# Patient Record
Sex: Male | Born: 1955
Health system: Southern US, Community
[De-identification: ages and names within clinical notes are randomized; demographics above are authoritative.]

## PROBLEM LIST (undated history)

## (undated) DIAGNOSIS — I4819 Other persistent atrial fibrillation: Secondary | ICD-10-CM

## (undated) DIAGNOSIS — G43909 Migraine, unspecified, not intractable, without status migrainosus: Secondary | ICD-10-CM

## (undated) DIAGNOSIS — G4733 Obstructive sleep apnea (adult) (pediatric): Secondary | ICD-10-CM

## (undated) DIAGNOSIS — K219 Gastro-esophageal reflux disease without esophagitis: Secondary | ICD-10-CM

## (undated) DIAGNOSIS — G8929 Other chronic pain: Secondary | ICD-10-CM

## (undated) DIAGNOSIS — E785 Hyperlipidemia, unspecified: Secondary | ICD-10-CM

## (undated) DIAGNOSIS — R51 Headache: Secondary | ICD-10-CM

## (undated) DIAGNOSIS — G4700 Insomnia, unspecified: Secondary | ICD-10-CM

## (undated) DIAGNOSIS — M549 Dorsalgia, unspecified: Secondary | ICD-10-CM

## (undated) DIAGNOSIS — Z9989 Dependence on other enabling machines and devices: Secondary | ICD-10-CM

## (undated) DIAGNOSIS — M109 Gout, unspecified: Secondary | ICD-10-CM

## (undated) DIAGNOSIS — I10 Essential (primary) hypertension: Secondary | ICD-10-CM

## (undated) DIAGNOSIS — J329 Chronic sinusitis, unspecified: Secondary | ICD-10-CM

## (undated) DIAGNOSIS — F419 Anxiety disorder, unspecified: Secondary | ICD-10-CM

## (undated) DIAGNOSIS — M419 Scoliosis, unspecified: Secondary | ICD-10-CM

## (undated) DIAGNOSIS — R519 Headache, unspecified: Secondary | ICD-10-CM

## (undated) DIAGNOSIS — Z87442 Personal history of urinary calculi: Secondary | ICD-10-CM

## (undated) DIAGNOSIS — I878 Other specified disorders of veins: Secondary | ICD-10-CM

## (undated) HISTORY — DX: Insomnia, unspecified: G47.00

## (undated) HISTORY — DX: Migraine, unspecified, not intractable, without status migrainosus: G43.909

## (undated) HISTORY — DX: Other persistent atrial fibrillation: I48.19

## (undated) HISTORY — DX: Other specified disorders of veins: I87.8

## (undated) HISTORY — DX: Essential (primary) hypertension: I10

## (undated) HISTORY — DX: Hyperlipidemia, unspecified: E78.5

## (undated) HISTORY — PX: ANKLE SURGERY: SHX546

## (undated) HISTORY — DX: Chronic sinusitis, unspecified: J32.9

## (undated) HISTORY — DX: Anxiety disorder, unspecified: F41.9

---
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
## 2000-08-28 ENCOUNTER — Ambulatory Visit (HOSPITAL_COMMUNITY): Admission: RE | Admit: 2000-08-28 | Discharge: 2000-08-28 | Payer: Self-pay | Admitting: Unknown Physician Specialty

## 2000-08-28 ENCOUNTER — Encounter: Payer: Self-pay | Admitting: Unknown Physician Specialty

## 2000-10-13 ENCOUNTER — Emergency Department (HOSPITAL_COMMUNITY): Admission: EM | Admit: 2000-10-13 | Discharge: 2000-10-13 | Payer: Self-pay | Admitting: Emergency Medicine

## 2001-06-12 ENCOUNTER — Encounter (HOSPITAL_COMMUNITY): Admission: RE | Admit: 2001-06-12 | Discharge: 2001-07-12 | Payer: Self-pay | Admitting: Family Medicine

## 2001-07-13 ENCOUNTER — Ambulatory Visit (HOSPITAL_COMMUNITY): Admission: RE | Admit: 2001-07-13 | Discharge: 2001-07-13 | Payer: Self-pay | Admitting: Family Medicine

## 2006-01-15 ENCOUNTER — Emergency Department (HOSPITAL_COMMUNITY): Admission: EM | Admit: 2006-01-15 | Discharge: 2006-01-16 | Payer: Self-pay | Admitting: Emergency Medicine

## 2006-11-25 ENCOUNTER — Ambulatory Visit (HOSPITAL_COMMUNITY): Admission: RE | Admit: 2006-11-25 | Discharge: 2006-11-25 | Payer: Self-pay | Admitting: Internal Medicine

## 2006-11-25 ENCOUNTER — Encounter: Payer: Self-pay | Admitting: Internal Medicine

## 2006-11-25 ENCOUNTER — Ambulatory Visit: Payer: Self-pay | Admitting: Internal Medicine

## 2008-11-18 ENCOUNTER — Emergency Department (HOSPITAL_COMMUNITY): Admission: EM | Admit: 2008-11-18 | Discharge: 2008-11-19 | Payer: Self-pay | Admitting: Emergency Medicine

## 2010-04-22 LAB — BASIC METABOLIC PANEL
BUN: 9 mg/dL (ref 6–23)
CO2: 32 mEq/L (ref 19–32)
Calcium: 10.3 mg/dL (ref 8.4–10.5)
Chloride: 105 mEq/L (ref 96–112)
Creatinine, Ser: 0.97 mg/dL (ref 0.4–1.5)
GFR calc Af Amer: >60 mL/min (ref >60)
GFR calc non Af Amer: >60 mL/min (ref >60)
Glucose, Bld: 100 mg/dL — ABNORMAL HIGH (ref 70–99)
Potassium: 4.1 mEq/L (ref 3.5–5.1)
Sodium: 141 mEq/L (ref 135–145)

## 2010-04-22 LAB — DIFFERENTIAL
Basophils Absolute: 0 10*3/uL (ref 0.0–0.1)
Basophils Relative: 0 % (ref 0–1)
Eosinophils Absolute: 0.3 10*3/uL (ref 0.0–0.7)
Eosinophils Relative: 4 % (ref 0–5)
Lymphocytes Relative: 34 % (ref 12–46)
Lymphs Abs: 2 10*3/uL (ref 0.7–4.0)
Monocytes Absolute: 0.4 10*3/uL (ref 0.1–1.0)
Monocytes Relative: 7 % (ref 3–12)
Neutro Abs: 3.2 10*3/uL (ref 1.7–7.7)
Neutrophils Relative %: 55 % (ref 43–77)

## 2010-04-22 LAB — POCT CARDIAC MARKERS
CKMB, poc: <1 ng/mL — ABNORMAL LOW (ref 1.0–8.0)
Myoglobin, poc: 85.3 ng/mL (ref 12–200)
Troponin i, poc: <0.05 ng/mL (ref 0.00–0.09)

## 2010-04-22 LAB — CBC
HCT: 43.2 % (ref 39.0–52.0)
Hemoglobin: 15.1 g/dL (ref 13.0–17.0)
MCHC: 35 g/dL (ref 30.0–36.0)
MCV: 91.7 fL (ref 78.0–100.0)
Platelets: 177 10*3/uL (ref 150–400)
RBC: 4.71 MIL/uL (ref 4.22–5.81)
RDW: 12.2 % (ref 11.5–15.5)
WBC: 5.9 10*3/uL (ref 4.0–10.5)

## 2010-06-02 NOTE — Op Note (Signed)
Anthony [name], Anthony [name]               ACCOUNT NO.:  [id number]   MEDICAL RECORD NO.:  [id number]          PATIENT TYPE:  AMB   LOCATION:  DAY                           FACILITY:  [facility]   PHYSICIAN:  R. Roetta Sessions, M.D. DATE OF BIRTH:  Nov 23, 1955   DATE OF PROCEDURE:  11/25/2006  DATE OF DISCHARGE:                               OPERATIVE REPORT   COLONOSCOPY WITH BIOPSY:   INDICATIONS FOR PROCEDURE:  Mr. Couser is a very pleasant 55 year old  Caucasian male with no lower GI tract symptoms sent over through the  courtesy of Dr. Lubertha South for colorectal cancer screening.  He has  never his lower GI tract imaged.  There is no family history of colon  cancer.  Colonoscopy is now being done as a standard screening maneuver.  This approach has been discussed with the patient along with the  potential risks, benefits and alternatives.  Please see the  documentation in the medical record.   PROCEDURE NOTE:  O2 saturation, blood pressure, pulse and respirations  were monitored throughout the entire procedure.  Conscious sedation:  Versed 4 mg IV, Demerol 75 mg IV in divided doses.  Instrument:  Pentax  video chip system.   FINDINGS:  Digital rectal exam revealed no abnormalities.  Endoscopic  findings:  The prep was adequate.   Colon:  Colonic mucosa was surveyed from the rectosigmoid junction  through the left, transverse, right colon to area of the appendiceal  orifice, ileocecal valve and cecum.  These structures were well-seen and  photographed for the record.  From this level the scope was slowly,  cautiously withdrawn.  All previously-mentioned mucosal surfaces were  again seen.  The colonic mucosa appeared entirely normal.  The scope was  pulled down into the rectum, where a thorough examination of the rectal  mucosa including a retroflexed view of the anal verge demonstrated  numerous 2-3-mm small sunflower-appearing lesions with discrete areas  of pale mucosa surrounded by  erythematous borders.  These lesions had a  curious appearance and did not appear to represent neoplasm or  inflammatory process.  Biopsies were taken.  The remaining rectal mucosa  appeared normal.  The patient tolerated the procedure well and was  reacted in endoscopy.   IMPRESSION:  1. Numerous tiny 2-3 mm sunflower-appearing lesions in the rectum,      likely not clinically significant, biopsied.  Otherwise normal      rectum.  2. Normal colon.   RECOMMENDATIONS:  1. Follow up on pathology.  2. Further recommendations to follow.      Jonathon Bellows, M.D.  Electronically Signed     RMR/MEDQ  D:  11/25/2006  T:  11/26/2006  Job:  [id number]   cc:   Donna Bernard, M.D.  Fax: [phone]

## 2010-07-12 ENCOUNTER — Emergency Department (HOSPITAL_COMMUNITY)
Admission: EM | Admit: 2010-07-12 | Discharge: 2010-07-12 | Disposition: A | Discharge status: Home / Self Care | Payer: 59 | Attending: Emergency Medicine | Admitting: Emergency Medicine

## 2010-07-12 DIAGNOSIS — Z79899 Other long term (current) drug therapy: Secondary | ICD-10-CM | POA: Insufficient documentation

## 2010-07-12 DIAGNOSIS — R509 Fever, unspecified: Secondary | ICD-10-CM | POA: Insufficient documentation

## 2010-07-12 DIAGNOSIS — I10 Essential (primary) hypertension: Secondary | ICD-10-CM | POA: Insufficient documentation

## 2010-07-12 DIAGNOSIS — E785 Hyperlipidemia, unspecified: Secondary | ICD-10-CM | POA: Insufficient documentation

## 2010-07-12 LAB — URINALYSIS, ROUTINE W REFLEX MICROSCOPIC
Bilirubin Urine: NEGATIVE
Hgb urine dipstick: NEGATIVE
Ketones, ur: NEGATIVE mg/dL
Nitrite: NEGATIVE
Protein, ur: NEGATIVE mg/dL
Specific Gravity, Urine: 1.025 (ref 1.005–1.030)
Urobilinogen, UA: 0.2 mg/dL (ref 0.0–1.0)

## 2010-07-12 LAB — BASIC METABOLIC PANEL
BUN: 11 mg/dL (ref 6–23)
Calcium: 9.7 mg/dL (ref 8.4–10.5)
GFR calc Af Amer: >60 mL/min (ref >60)
GFR calc non Af Amer: >60 mL/min (ref >60)
Glucose, Bld: 110 mg/dL — ABNORMAL HIGH (ref 70–99)
Potassium: 3.6 mEq/L (ref 3.5–5.1)

## 2010-07-12 LAB — DIFFERENTIAL
Basophils Absolute: 0 10*3/uL (ref 0.0–0.1)
Basophils Relative: 1 % (ref 0–1)
Eosinophils Relative: 0 % (ref 0–5)
Monocytes Absolute: 0.3 10*3/uL (ref 0.1–1.0)
Monocytes Relative: 8 % (ref 3–12)

## 2010-07-12 LAB — CBC
HCT: 38.1 % — ABNORMAL LOW (ref 39.0–52.0)
Hemoglobin: 13.6 g/dL (ref 13.0–17.0)
MCH: 31.7 pg (ref 26.0–34.0)
MCHC: 35.7 g/dL (ref 30.0–36.0)
RDW: 12 % (ref 11.5–15.5)

## 2012-05-08 ENCOUNTER — Telehealth: Payer: Self-pay | Admitting: Family Medicine

## 2012-05-08 NOTE — Telephone Encounter (Signed)
Please see attached to chart refill for Sumatriptan to [facility]

## 2012-05-09 ENCOUNTER — Other Ambulatory Visit: Payer: Self-pay

## 2012-05-09 MED ORDER — SUMATRIPTAN SUCCINATE 50 MG PO TABS: ORAL_TABLET | ORAL | Status: DC

## 2012-05-09 NOTE — Telephone Encounter (Signed)
May refill times three 

## 2012-05-09 NOTE — Telephone Encounter (Signed)
RX sent in to pharmacy via E-prescribe

## 2012-05-16 ENCOUNTER — Encounter: Payer: Self-pay | Admitting: *Deleted

## 2012-05-19 ENCOUNTER — Other Ambulatory Visit: Payer: Self-pay | Admitting: Family Medicine

## 2012-05-19 NOTE — Telephone Encounter (Signed)
Had PE 02/21/12

## 2012-05-19 NOTE — Telephone Encounter (Signed)
Ambien RX faxed to [facility]

## 2012-05-20 ENCOUNTER — Other Ambulatory Visit: Payer: Self-pay | Admitting: Family Medicine

## 2012-05-22 NOTE — Telephone Encounter (Signed)
Ok plus 3 refills 

## 2012-06-02 ENCOUNTER — Ambulatory Visit (INDEPENDENT_AMBULATORY_CARE_PROVIDER_SITE_OTHER): Payer: 59 | Admitting: Family Medicine

## 2012-06-02 ENCOUNTER — Encounter: Payer: Self-pay | Admitting: Family Medicine

## 2012-06-02 VITALS — BP 166/104 | Temp 98.2°F | Wt 205.2 lb

## 2012-06-02 DIAGNOSIS — E785 Hyperlipidemia, unspecified: Secondary | ICD-10-CM | POA: Insufficient documentation

## 2012-06-02 DIAGNOSIS — J029 Acute pharyngitis, unspecified: Secondary | ICD-10-CM

## 2012-06-02 DIAGNOSIS — I10 Essential (primary) hypertension: Secondary | ICD-10-CM | POA: Insufficient documentation

## 2012-06-02 MED ORDER — DOXYCYCLINE HYCLATE 100 MG PO TABS: 100.0000 mg | ORAL_TABLET | Freq: Two times a day (BID) | ORAL | Status: DC

## 2012-06-02 NOTE — Progress Notes (Signed)
  Subjective:    Patient ID: Anthony [name], male    DOB: [birth year], 57 y.o.   MRN: [id number]  Sore Throat  This is a new problem. The current episode started yesterday. The problem has been gradually worsening. Neither side of throat is experiencing more pain than the other. The maximum temperature recorded prior to his arrival was 100 - 100.9 F. Associated symptoms include congestion and coughing. He has had no exposure to strep. He has tried nothing for the symptoms. The treatment provided mild relief.    Occasional cough diminished energy. States even his skin hurts  Review of Systems  HENT: Positive for congestion.   Respiratory: Positive for cough.    ROS otherwise negative    Objective:   Physical Exam  Alert no acute distress. Vitals stable. Lungs clear. Heart regular in rhythm. Pharynx very erythematous. Frontal tenderness.      Assessment & Plan:  Impression pharyngitis with lymphadenitis and systemic features. Plan antibiotics prescribed. Symptomatic care discussed.

## 2012-06-06 ENCOUNTER — Encounter: Payer: Self-pay | Admitting: Family Medicine

## 2012-06-06 ENCOUNTER — Telehealth: Payer: Self-pay | Admitting: Family Medicine

## 2012-06-06 ENCOUNTER — Other Ambulatory Visit: Payer: Self-pay | Admitting: *Deleted

## 2012-06-06 MED ORDER — BENZONATATE 100 MG PO CAPS: 100.0000 mg | ORAL_CAPSULE | Freq: Three times a day (TID) | ORAL | Status: DC | PRN

## 2012-06-06 MED ORDER — AZITHROMYCIN 1 G PO PACK: 1.0000 | PACK | Freq: Once | ORAL | Status: DC

## 2012-06-06 NOTE — Telephone Encounter (Signed)
Meds called in, pt aware

## 2012-06-06 NOTE — Telephone Encounter (Signed)
I would recommend switching from Doxy to a z-pack. As for cough med two things #1 - cough is typically for a reason and until the illness gets better than the cough will persist. #2- pt list allergic to codiene therefore use Tessalon 100mg  one tid prn cough , 20 pearles, 1 refill.   If symptoms persist or worsen follow up

## 2012-06-06 NOTE — Telephone Encounter (Signed)
Patient is not feeling any better and would like some cough syrup called in and a work excuse for today and tomorrow.

## 2012-06-06 NOTE — Telephone Encounter (Signed)
Patient seen on 06/02/12 and was diagnosed with pharyngitis with lymphadenitis and systemic features. Was prescribed doxycycline 100 mg BID x 10 days.

## 2012-06-09 DIAGNOSIS — Z0289 Encounter for other administrative examinations: Secondary | ICD-10-CM

## 2012-06-28 ENCOUNTER — Emergency Department (HOSPITAL_COMMUNITY)
Admission: EM | Admit: 2012-06-28 | Discharge: 2012-06-28 | Disposition: A | Discharge status: Home / Self Care | Payer: 59 | Attending: Emergency Medicine | Admitting: Emergency Medicine

## 2012-06-28 ENCOUNTER — Encounter (HOSPITAL_COMMUNITY): Payer: Self-pay | Admitting: Emergency Medicine

## 2012-06-28 DIAGNOSIS — R112 Nausea with vomiting, unspecified: Secondary | ICD-10-CM | POA: Insufficient documentation

## 2012-06-28 DIAGNOSIS — G4700 Insomnia, unspecified: Secondary | ICD-10-CM | POA: Insufficient documentation

## 2012-06-28 DIAGNOSIS — I10 Essential (primary) hypertension: Secondary | ICD-10-CM | POA: Insufficient documentation

## 2012-06-28 DIAGNOSIS — Z79899 Other long term (current) drug therapy: Secondary | ICD-10-CM | POA: Insufficient documentation

## 2012-06-28 DIAGNOSIS — R509 Fever, unspecified: Secondary | ICD-10-CM | POA: Insufficient documentation

## 2012-06-28 DIAGNOSIS — R52 Pain, unspecified: Secondary | ICD-10-CM | POA: Insufficient documentation

## 2012-06-28 DIAGNOSIS — R197 Diarrhea, unspecified: Secondary | ICD-10-CM | POA: Insufficient documentation

## 2012-06-28 DIAGNOSIS — R000 Tachycardia, unspecified: Secondary | ICD-10-CM | POA: Insufficient documentation

## 2012-06-28 DIAGNOSIS — E785 Hyperlipidemia, unspecified: Secondary | ICD-10-CM | POA: Insufficient documentation

## 2012-06-28 DIAGNOSIS — R0602 Shortness of breath: Secondary | ICD-10-CM | POA: Insufficient documentation

## 2012-06-28 DIAGNOSIS — Z8709 Personal history of other diseases of the respiratory system: Secondary | ICD-10-CM | POA: Insufficient documentation

## 2012-06-28 DIAGNOSIS — R109 Unspecified abdominal pain: Secondary | ICD-10-CM | POA: Insufficient documentation

## 2012-06-28 DIAGNOSIS — M549 Dorsalgia, unspecified: Secondary | ICD-10-CM | POA: Insufficient documentation

## 2012-06-28 DIAGNOSIS — G43909 Migraine, unspecified, not intractable, without status migrainosus: Secondary | ICD-10-CM | POA: Insufficient documentation

## 2012-06-28 DIAGNOSIS — Z8679 Personal history of other diseases of the circulatory system: Secondary | ICD-10-CM | POA: Insufficient documentation

## 2012-06-28 DIAGNOSIS — M79609 Pain in unspecified limb: Secondary | ICD-10-CM | POA: Insufficient documentation

## 2012-06-28 LAB — POCT I-STAT, CHEM 8
BUN: 12 mg/dL (ref 6–23)
Calcium, Ion: 1.11 mmol/L — ABNORMAL LOW (ref 1.12–1.23)
Chloride: 105 mEq/L (ref 96–112)
Creatinine, Ser: 1.1 mg/dL (ref 0.50–1.35)
Glucose, Bld: 99 mg/dL (ref 70–99)
HCT: 44 % (ref 39.0–52.0)
Hemoglobin: 15 g/dL (ref 13.0–17.0)
Potassium: 3.8 mEq/L (ref 3.5–5.1)
Sodium: 142 mEq/L (ref 135–145)
TCO2: 26 mmol/L (ref 0–100)

## 2012-06-28 MED ORDER — ONDANSETRON HCL 4 MG PO TABS: 4.0000 mg | ORAL_TABLET | Freq: Four times a day (QID) | ORAL | Status: DC

## 2012-06-28 MED ORDER — LOPERAMIDE HCL 2 MG PO CAPS
4.0000 mg | ORAL_CAPSULE | Freq: Once | ORAL | Status: AC
  Administered 2012-06-28: 4 mg via ORAL
  Filled 2012-06-28 (×2): qty 2

## 2012-06-28 MED ORDER — KETOROLAC TROMETHAMINE 30 MG/ML IJ SOLN
15.0000 mg | Freq: Once | INTRAMUSCULAR | Status: AC
  Administered 2012-06-28: 15 mg via INTRAVENOUS
  Filled 2012-06-28 (×2): qty 1

## 2012-06-28 MED ORDER — MORPHINE SULFATE 4 MG/ML IJ SOLN
4.0000 mg | Freq: Once | INTRAMUSCULAR | Status: AC
  Administered 2012-06-28: 4 mg via INTRAVENOUS
  Filled 2012-06-28 (×2): qty 1

## 2012-06-28 MED ORDER — SODIUM CHLORIDE 0.9 % IV BOLUS (SEPSIS)
1000.0000 mL | Freq: Once | INTRAVENOUS | Status: AC
  Administered 2012-06-28: 1000 mL via INTRAVENOUS

## 2012-06-28 MED ORDER — ONDANSETRON HCL 4 MG/2ML IJ SOLN
4.0000 mg | Freq: Once | INTRAMUSCULAR | Status: AC
  Administered 2012-06-28: 4 mg via INTRAVENOUS
  Filled 2012-06-28 (×2): qty 2

## 2012-06-28 NOTE — ED Provider Notes (Signed)
History  This chart was scribed for Raeford Razor, MD by Ardelia Mems, ED Scribe. This patient was seen in room [facility] and the patient's care was started at 9:05 PM.   CSN: [id number]  Arrival date & time 06/28/12  7829     Chief Complaint  Patient presents with  . Generalized Body Aches  . Nausea  . Emesis  . Diarrhea    The history is provided by the patient. No language interpreter was used.    HPI Comments: Anthony [name] is a 57 y.o. male who presents to the Emergency Department complaining of generalized body aches with associated nausea, vomiting, diarrhea, fever, chills and mild SOB onset yesterday afternoon. Pt states that he has had multiple episodes of emesis without blood. Triage Temp is 100.65F. Pt states that he is having abdominal pain, back pain, bilateral leg pain and arm pain. Pt states that he was with his sick step-daughter yesterday who had similar symptoms and is now better. Pt has tried Advil with some relief. Pt states that he took a Z-pack 4 weeks ago for a sinus infection. Pt denies recent travel. Pt denies urinary symptoms, cough, rash, hematochezia, or any other symptoms.     Past Medical History  Diagnosis Date  . Hypertension   . Hyperlipidemia   . Migraine headache   . Back pain   . Insomnia   . Reflux   . Anxiety   . Venous stasis   . Sinusitis     Past Surgical History  Procedure Laterality Date  . Ankle surgery    . Abdominal surgery      No family history on file.  History  Substance Use Topics  . Smoking status: Never Smoker   . Smokeless tobacco: Not on file  . Alcohol Use: No      Review of Systems  Constitutional: Positive for fever and chills.  Respiratory: Positive for shortness of breath. Negative for cough.   Gastrointestinal: Positive for nausea, vomiting, abdominal pain and diarrhea. Negative for blood in stool.  Genitourinary: Negative for dysuria.  Musculoskeletal: Positive for back pain.       Bilateral  leg and arm pain.  Skin: Negative for rash.   A complete 10 system review of systems was obtained and all systems are negative except as noted in the HPI and PMH.   Allergies  Asa; Codeine; Lipitor; Nsaids; and Zocor  Home Medications   Current Outpatient Rx  Name  Route  Sig  Dispense  Refill  . ALPRAZolam (XANAX) 0.5 MG tablet      take 1 tablet if needed for anxiety   24 tablet   3   . Ascorbic Acid (VITAMIN C) 1000 MG tablet   Oral   Take 1,000 mg by mouth daily.         . benzonatate (TESSALON PERLES) 100 MG capsule   Oral   Take 1 capsule (100 mg total) by mouth 3 (three) times daily as needed for cough.   20 capsule   1   . diclofenac (VOLTAREN) 75 MG EC tablet   Oral   Take 75 mg by mouth 2 (two) times daily.         Marland Kitchen doxycycline (VIBRA-TABS) 100 MG tablet   Oral   Take 1 tablet (100 mg total) by mouth 2 (two) times daily.   20 tablet   0   . ezetimibe (ZETIA) 10 MG tablet   Oral   Take 10 mg by mouth daily.         Marland Kitchen  lisinopril (PRINIVIL,ZESTRIL) 20 MG tablet   Oral   Take 20 mg by mouth. Take one BID         . loratadine (CLARITIN) 10 MG tablet   Oral   Take 10 mg by mouth daily.         . Multiple Vitamin (MULTIVITAMIN) tablet   Oral   Take 1 tablet by mouth daily.         . SUMAtriptan (IMITREX) 50 MG tablet      Take 1 tablet at first sign of headache, may take 2 tablets 2 hours later if needed   20 tablet   3   . verapamil (CALAN-SR) 240 MG CR tablet   Oral   Take 240 mg by mouth at bedtime.         . vitamin E 400 UNIT capsule   Oral   Take 400 Units by mouth daily.         Marland Kitchen zolpidem (AMBIEN) 10 MG tablet   Oral   Take 10 mg by mouth at bedtime as needed for sleep.           Triage Vitals: BP 160/105  Pulse 113  Temp(Src) 100.2 F (37.9 C) (Oral)  Resp 20  Ht 6' (1.829 m)  Wt 200 lb (90.719 kg)  BMI 27.12 kg/m2  SpO2 98%  Physical Exam  Nursing note and vitals reviewed. Constitutional: He is  oriented to person, place, and time. He appears well-developed and well-nourished.  HENT:  Head: Normocephalic and atraumatic.  Eyes: EOM are normal.  Neck: Normal range of motion.  Cardiovascular: Normal rate, regular rhythm, normal heart sounds and intact distal pulses.   tachycardic.  Pulmonary/Chest: Effort normal and breath sounds normal. No respiratory distress.  Abdominal: Soft. He exhibits no distension. There is no tenderness.  Genitourinary: Rectum normal.  Musculoskeletal: Normal range of motion.  Neurological: He is alert and oriented to person, place, and time.  Skin: Skin is warm and dry.  Psychiatric: He has a normal mood and affect. Judgment normal.    ED Course  Procedures (including critical care time)  DIAGNOSTIC STUDIES: Oxygen Saturation is 98% on RA, normal by my interpretation.    COORDINATION OF CARE: 9:23 PM- Pt advised of plan for treatment and pt agrees.  10:30 PM- Pt recheck and pt is feeling better.   Medications  ondansetron (ZOFRAN) injection 4 mg (4 mg Intravenous Given 06/28/12 2219)  ketorolac (TORADOL) 30 MG/ML injection 15 mg (15 mg Intravenous Given 06/28/12 2223)  loperamide (IMODIUM) capsule 4 mg (4 mg Oral Given 06/28/12 2225)  morphine 4 MG/ML injection 4 mg (4 mg Intravenous Given 06/28/12 2221)  sodium chloride 0.9 % bolus 1,000 mL (0 mLs Intravenous Stopped 06/28/12 2316)      Labs Reviewed  POCT I-STAT, CHEM 8 - Abnormal; Notable for the following:    Calcium, Ion 1.11 (*)    All other components within normal limits   No results found.   1. Nausea vomiting and diarrhea       MDM  Suspect viral illness. benign abdominal exam. HD stable. Tachycardia improved with IVF/nsaids. Tolerating po prior to DC. Return precautions discussed.         I personally preformed the services scribed in my presence. The recorded information has been reviewed is accurate. Raeford Razor, MD.    Raeford Razor, MD 07/04/12 [phone]

## 2012-06-28 NOTE — ED Notes (Signed)
Feels better except for generalized headache

## 2012-06-28 NOTE — ED Notes (Signed)
Pt c/o generalized body aches with n/v/d for about 12hours.

## 2012-06-29 MED ORDER — ONDANSETRON HCL 4 MG/2ML IJ SOLN
INTRAMUSCULAR | Status: AC
  Filled 2012-06-29: qty 2

## 2012-07-06 ENCOUNTER — Ambulatory Visit (INDEPENDENT_AMBULATORY_CARE_PROVIDER_SITE_OTHER): Payer: 59 | Admitting: Family Medicine

## 2012-07-06 ENCOUNTER — Encounter: Payer: Self-pay | Admitting: Family Medicine

## 2012-07-06 VITALS — BP 126/97 | Temp 98.7°F | Wt 205.4 lb

## 2012-07-06 DIAGNOSIS — K5289 Other specified noninfective gastroenteritis and colitis: Secondary | ICD-10-CM

## 2012-07-06 DIAGNOSIS — K529 Noninfective gastroenteritis and colitis, unspecified: Secondary | ICD-10-CM

## 2012-07-06 NOTE — Progress Notes (Signed)
  Subjective:    Patient ID: Anthony Hail., male    DOB: 04-Aug-1955, 57 y.o.   MRN: [id number]  HPI Nausea felt better. Vomited times fifteen before then. Diarrhea too. Missed work for three days. Had to go to the emergency room.  Still experiencing spells of nausea intermittently. Loose stools overall have improved. Intermittent abdominal pain. Crampy in nature.  . Others had similar spells. Still quite fatigued. Review of Systems    review systems otherwise negative. Objective:   Physical Exam  Alert good hydration. Lungs clear. Heart regular rate and rhythm. HEENT normal. Abdomen soft good bowel sounds no discrete tenderness.      Assessment & Plan:  Impression resulting gastroenteritis with severe symptomatology. Slowly improving. Plan diet discussed. Gradual increase activity. Zofran when necessary for nausea. WSL

## 2012-08-22 ENCOUNTER — Telehealth: Payer: Self-pay | Admitting: Family Medicine

## 2012-08-22 MED ORDER — AMOXICILLIN 500 MG PO TABS: 500.0000 mg | ORAL_TABLET | Freq: Three times a day (TID) | ORAL | Status: DC

## 2012-08-22 NOTE — Telephone Encounter (Signed)
Rx sent electronically to [facility].  Patient notified. 

## 2012-08-22 NOTE — Telephone Encounter (Signed)
Review sx, if sore throat/pain then amoxil 500 one tid 10 days OV if worse

## 2012-08-22 NOTE — Telephone Encounter (Signed)
Patient stated that his grandson and son Gordy Clement and Theodoro Parma) have been up here in the last few days and diagnosed with strep. He said that Dr Lorin Picket told Bentleys grandfather that anyone who had been exposed to [facility] and [facility] and is having symptoms will probably need an antibiotic and he would gladly call something in for them. He would like something called in to [facility] due to him having symptoms.

## 2012-08-26 ENCOUNTER — Other Ambulatory Visit: Payer: Self-pay | Admitting: Family Medicine

## 2012-09-16 ENCOUNTER — Other Ambulatory Visit: Payer: Self-pay | Admitting: Family Medicine

## 2012-10-19 ENCOUNTER — Ambulatory Visit (INDEPENDENT_AMBULATORY_CARE_PROVIDER_SITE_OTHER): Payer: 59 | Admitting: Family Medicine

## 2012-10-19 ENCOUNTER — Encounter: Payer: Self-pay | Admitting: Family Medicine

## 2012-10-19 VITALS — BP 128/84 | Temp 98.5°F | Ht 72.0 in | Wt 201.0 lb

## 2012-10-19 DIAGNOSIS — A088 Other specified intestinal infections: Secondary | ICD-10-CM

## 2012-10-19 DIAGNOSIS — A084 Viral intestinal infection, unspecified: Secondary | ICD-10-CM

## 2012-10-19 NOTE — Progress Notes (Signed)
  Subjective:    Patient ID: Anthony Hail., male    DOB: [birth year], 57 y.o.   MRN: [id number]  Emesis  This is a new problem. The current episode started in the past 7 days. Associated symptoms include abdominal pain, chills, diarrhea and myalgias. He has tried acetaminophen for the symptoms.    Feels really bad, von times three, otc pepto bis . tyl also  Diminished energy. Took old phenergan. Still sig indigestion.  Trying to keep up on fluids, gatorade etc.  Review of Systems  Constitutional: Positive for chills.  Gastrointestinal: Positive for vomiting, abdominal pain and diarrhea.  Musculoskeletal: Positive for myalgias.       Objective:   Physical Exam  Alert mild malaise. Hydration good. Neck supple HEENT normal. Lungs clear. Heart regular in rhythm abdomen hyperactive bowel sounds diffuse mild tenderness.      Assessment & Plan:  Impression viral syndrome with significant gastrointestinal symptoms discussed plan Zofran when necessary. Lomotil when necessary. Diet X. exercise discussed warning signs discussed. WSL

## 2012-10-26 DIAGNOSIS — Z029 Encounter for administrative examinations, unspecified: Secondary | ICD-10-CM

## 2012-11-04 ENCOUNTER — Other Ambulatory Visit: Payer: Self-pay | Admitting: Family Medicine

## 2012-11-13 ENCOUNTER — Other Ambulatory Visit: Payer: Self-pay | Admitting: Family Medicine

## 2012-11-13 NOTE — Telephone Encounter (Signed)
Ok five ref 

## 2012-12-18 ENCOUNTER — Other Ambulatory Visit: Payer: Self-pay | Admitting: Family Medicine

## 2012-12-19 NOTE — Telephone Encounter (Signed)
Steve's patient not mine

## 2012-12-19 NOTE — Telephone Encounter (Signed)
Ok plus 5 ref 

## 2012-12-21 ENCOUNTER — Other Ambulatory Visit: Payer: Self-pay | Admitting: *Deleted

## 2012-12-21 MED ORDER — ALPRAZOLAM 0.5 MG PO TABS: 0.5000 mg | ORAL_TABLET | Freq: Every day | ORAL | Status: DC | PRN

## 2013-02-21 ENCOUNTER — Other Ambulatory Visit: Payer: Self-pay | Admitting: Family Medicine

## 2013-03-08 ENCOUNTER — Encounter: Payer: Self-pay | Admitting: Nurse Practitioner

## 2013-03-08 ENCOUNTER — Ambulatory Visit (INDEPENDENT_AMBULATORY_CARE_PROVIDER_SITE_OTHER): Payer: 59 | Admitting: Nurse Practitioner

## 2013-03-08 VITALS — BP 128/82 | Temp 98.4°F | Ht 72.0 in | Wt 210.6 lb

## 2013-03-08 DIAGNOSIS — J069 Acute upper respiratory infection, unspecified: Secondary | ICD-10-CM

## 2013-03-08 MED ORDER — HYDROCODONE-HOMATROPINE 5-1.5 MG/5ML PO SYRP: 5.0000 mL | ORAL_SOLUTION | ORAL | Status: DC | PRN

## 2013-03-08 MED ORDER — AMOXICILLIN-POT CLAVULANATE 875-125 MG PO TABS: 1.0000 | ORAL_TABLET | Freq: Two times a day (BID) | ORAL | Status: DC

## 2013-03-08 NOTE — Patient Instructions (Signed)
nasacort AQ as directed Biotin

## 2013-03-10 ENCOUNTER — Other Ambulatory Visit: Payer: Self-pay | Admitting: Family Medicine

## 2013-03-11 ENCOUNTER — Encounter: Payer: Self-pay | Admitting: Nurse Practitioner

## 2013-03-11 NOTE — Progress Notes (Signed)
Subjective:  Presents for c/o sore throat and cough that began 2 days ago. Facial pressure. Chills. Possible fever. Ear pressure and pain. Cough worse at night. Producing yellow mucus.   Objective:   BP 128/82  Temp(Src) 98.4 F (36.9 C) (Oral)  Ht 6' (1.829 m)  Wt 210 lb 9.6 oz (95.528 kg)  BMI 28.56 kg/m2 NAD. Alert, oriented. TMs clear effusion. Pharynx mild erythema. Neck supple with mild anterior adenopathy. Lungs clear. Heart RRR.  Assessment: Acute upper respiratory infections of unspecified site  Plan:  Meds ordered this encounter  Medications  . amoxicillin-clavulanate (AUGMENTIN) 875-125 MG per tablet    Sig: Take 1 tablet by mouth 2 (two) times daily.    Dispense:  20 tablet    Refill:  0    Order Specific Question:  Supervising Provider    Answer:  Mikey Kirschner [[id number]]  . HYDROcodone-homatropine (HYCODAN) 5-1.5 MG/5ML syrup    Sig: Take 5 mLs by mouth every 4 (four) hours as needed.    Dispense:  120 mL    Refill:  0    Order Specific Question:  Supervising Provider    Answer:  Mikey Kirschner [[id number]]  reviewed warning signs and symptomatic care. Call back if worsens or persists.

## 2013-03-12 ENCOUNTER — Encounter: Payer: Self-pay | Admitting: Family Medicine

## 2013-03-13 ENCOUNTER — Encounter: Payer: Self-pay | Admitting: Family Medicine

## 2013-03-13 ENCOUNTER — Telehealth: Payer: Self-pay | Admitting: Family Medicine

## 2013-03-13 MED ORDER — LEVOFLOXACIN 500 MG PO TABS: 500.0000 mg | ORAL_TABLET | Freq: Every day | ORAL | Status: DC

## 2013-03-13 NOTE — Telephone Encounter (Signed)
levaquin 500 qd ten d 

## 2013-03-13 NOTE — Telephone Encounter (Signed)
Please give work excuse today and tomorrow

## 2013-03-13 NOTE — Telephone Encounter (Signed)
Saw Anthony [name] on 02/19 diagnosed with URI. Prescribed augmentin and hycodan

## 2013-03-13 NOTE — Telephone Encounter (Signed)
Med sent to pharm. Pt notified. On voicemail.

## 2013-03-13 NOTE — Telephone Encounter (Signed)
Up front for patient to pick up

## 2013-03-13 NOTE — Telephone Encounter (Signed)
Patient is still having nasal drainage, sore throat, no appetite. Would like something else called in. [facility]  Patient would also like a work excuse for today and tomorrow.

## 2013-03-22 DIAGNOSIS — Z0289 Encounter for other administrative examinations: Secondary | ICD-10-CM

## 2013-05-11 ENCOUNTER — Other Ambulatory Visit: Payer: Self-pay | Admitting: Family Medicine

## 2013-05-11 NOTE — Telephone Encounter (Signed)
Ok plus five mo worth

## 2013-06-05 ENCOUNTER — Ambulatory Visit (INDEPENDENT_AMBULATORY_CARE_PROVIDER_SITE_OTHER): Payer: 59 | Admitting: Family Medicine

## 2013-06-05 ENCOUNTER — Encounter: Payer: Self-pay | Admitting: Family Medicine

## 2013-06-05 VITALS — BP 154/96 | Temp 97.7°F | Ht 72.0 in | Wt 207.0 lb

## 2013-06-05 DIAGNOSIS — G4733 Obstructive sleep apnea (adult) (pediatric): Secondary | ICD-10-CM

## 2013-06-05 DIAGNOSIS — I10 Essential (primary) hypertension: Secondary | ICD-10-CM

## 2013-06-05 DIAGNOSIS — K219 Gastro-esophageal reflux disease without esophagitis: Secondary | ICD-10-CM

## 2013-06-05 DIAGNOSIS — G4730 Sleep apnea, unspecified: Secondary | ICD-10-CM

## 2013-06-05 DIAGNOSIS — Z79899 Other long term (current) drug therapy: Secondary | ICD-10-CM

## 2013-06-05 DIAGNOSIS — Z125 Encounter for screening for malignant neoplasm of prostate: Secondary | ICD-10-CM

## 2013-06-05 MED ORDER — PANTOPRAZOLE SODIUM 40 MG PO TBEC: 40.0000 mg | DELAYED_RELEASE_TABLET | Freq: Every day | ORAL | Status: DC

## 2013-06-05 NOTE — Progress Notes (Signed)
   Subjective:    Patient ID: Anthony [name]., male    DOB: 1955/12/10, 58 y.o.   MRN: [id number]  HPI Patient is here today b/c he said he choked this morning at work on a biscuit. He said this has never happened before.   He said nothing was lodged in his throat, he just couldn't breathe, so he spit it out. It was his first bite. He gets this biscuit from the same place often, so it wasn't anything new.   Pt panicked, could not catch a breath, and had a HA afterwards.   Pt says he feels OK now.  Eating a biscuit, all of a sudden could not breathe  Started getting tunnel vision, barely could breathe  Finally got better  Water Ok since then  [employer] and cheese  Takes reflux ot cmeds ora reflux meds otc. The over-the-counter medications are not working at this time.  Daytime energy not the best, oft4en doesn't feel good  freq trouble breathing at night. Tremendous amount of snoring. Definitely getting worse. Frequently stops breathing through the night. This is worrisome to the patient's wife. Never had this spell before    Review of Systems No chest pain no headache no back pain positive daytime fatigue no change in bowel habits some chronic epigastric discomfort. ROS otherwise negative    Objective:   Physical Exam Alert no acute distress. H&T normal neck supple. Pharynx normal lungs clear. Heart regular in rhythm. Mild epigastric tenderness.       Assessment & Plan:  Impression 3 distinct diagnoses #1 worsening sleep apnea time to press on with sleep study discussed at length. #2 reflux with gastritis and responsive to over-the-counter agents. #3 choking spell. Proper chewing of food discussed. Heimlich maneuver discussed with family. Plan followup in one month. Appropriate blood work. Sleep study. Protonic stable he. WSL

## 2013-06-07 LAB — HEPATIC FUNCTION PANEL
ALT: 26 U/L (ref 0–53)
AST: 24 U/L (ref 0–37)
Albumin: 4.1 g/dL (ref 3.5–5.2)
Alkaline Phosphatase: 54 U/L (ref 39–117)
BILIRUBIN DIRECT: 0.1 mg/dL (ref 0.0–0.3)
Indirect Bilirubin: 0.6 mg/dL (ref 0.2–1.2)
Total Bilirubin: 0.7 mg/dL (ref 0.2–1.2)
Total Protein: 6.5 g/dL (ref 6.0–8.3)

## 2013-06-07 LAB — BASIC METABOLIC PANEL
BUN: 12 mg/dL (ref 6–23)
CO2: 29 mEq/L (ref 19–32)
Calcium: 9 mg/dL (ref 8.4–10.5)
Chloride: 107 mEq/L (ref 96–112)
Creat: 1.13 mg/dL (ref 0.50–1.35)
Glucose, Bld: 87 mg/dL (ref 70–99)
POTASSIUM: 4 meq/L (ref 3.5–5.3)
Sodium: 142 mEq/L (ref 135–145)

## 2013-06-07 LAB — LIPID PANEL
CHOL/HDL RATIO: 6.3 ratio
CHOLESTEROL: 209 mg/dL — AB (ref 0–200)
HDL: 33 mg/dL — AB (ref >39)
LDL Cholesterol: 153 mg/dL — ABNORMAL HIGH (ref 0–99)
Triglycerides: 115 mg/dL (ref <150)
VLDL: 23 mg/dL (ref 0–40)

## 2013-06-08 LAB — PSA: PSA: 0.71 ng/mL (ref <=4.00)

## 2013-06-15 ENCOUNTER — Encounter: Payer: Self-pay | Admitting: Family Medicine

## 2013-06-15 ENCOUNTER — Ambulatory Visit (INDEPENDENT_AMBULATORY_CARE_PROVIDER_SITE_OTHER): Payer: 59 | Admitting: Family Medicine

## 2013-06-15 VITALS — BP 132/90 | Temp 98.1°F | Ht 72.0 in | Wt 206.5 lb

## 2013-06-15 DIAGNOSIS — J329 Chronic sinusitis, unspecified: Secondary | ICD-10-CM

## 2013-06-15 MED ORDER — AMOXICILLIN-POT CLAVULANATE 875-125 MG PO TABS
1.0000 | ORAL_TABLET | Freq: Two times a day (BID) | ORAL | Status: AC
Start: 2013-06-15 — End: 2013-06-29

## 2013-06-15 MED ORDER — FLUTICASONE PROPIONATE 50 MCG/ACT NA SUSP: NASAL | Status: DC

## 2013-06-15 NOTE — Progress Notes (Signed)
   Subjective:    Patient ID: Anthony [name]., male    DOB: 07-23-1955, 58 y.o.   MRN: [id number]  Sinusitis This is a new problem. The current episode started yesterday. The problem is unchanged. There has been no fever. His pain is at a severity of 10/10. The pain is moderate. Associated symptoms include ear pain, headaches, sinus pressure and a sore throat. (Spot on throat) Past treatments include acetaminophen. The treatment provided no relief.   Patient states that he has discomfort in his fingers also. Fingers bother at times, tight, trigger finger right hand, right handed  Allergies have been acting up,  Normally on claritin, not helping as much as hoped    Review of Systems  HENT: Positive for ear pain, sinus pressure and sore throat.   Neurological: Positive for headaches.   Vomiting no diarrhea    Objective:   Physical Exam  Alert vital signs stable frontal maxillary tenderness pharynx erythema neck supple. Lungs clear. Heart regular in rhythm.     Assessment & Plan:  Impression acute rhinosinusitis plan antibiotics prescribed. Symptomatic care discussed. WSL

## 2013-07-02 ENCOUNTER — Encounter: Payer: Self-pay | Admitting: Family Medicine

## 2013-07-02 ENCOUNTER — Ambulatory Visit (INDEPENDENT_AMBULATORY_CARE_PROVIDER_SITE_OTHER): Payer: 59 | Admitting: Family Medicine

## 2013-07-02 VITALS — BP 156/90 | Ht 72.0 in | Wt 209.0 lb

## 2013-07-02 DIAGNOSIS — G4733 Obstructive sleep apnea (adult) (pediatric): Secondary | ICD-10-CM

## 2013-07-02 DIAGNOSIS — F329 Major depressive disorder, single episode, unspecified: Secondary | ICD-10-CM

## 2013-07-02 DIAGNOSIS — I10 Essential (primary) hypertension: Secondary | ICD-10-CM

## 2013-07-02 DIAGNOSIS — K219 Gastro-esophageal reflux disease without esophagitis: Secondary | ICD-10-CM

## 2013-07-02 DIAGNOSIS — F3289 Other specified depressive episodes: Secondary | ICD-10-CM

## 2013-07-02 DIAGNOSIS — F32A Depression, unspecified: Secondary | ICD-10-CM | POA: Insufficient documentation

## 2013-07-02 DIAGNOSIS — E785 Hyperlipidemia, unspecified: Secondary | ICD-10-CM

## 2013-07-02 MED ORDER — BUPROPION HCL ER (SR) 150 MG PO TB12: ORAL_TABLET | ORAL | Status: DC

## 2013-07-02 NOTE — Progress Notes (Signed)
   Subjective:    Patient ID: Anthony Congo., male    DOB: 01-Dec-1955, 58 y.o.   MRN: [id number]  HPI Patient is here today for 1 month follow up.  He said he is being very careful when chewing now. No more choking episodes. Patient has had no further choking spells.  Pt still c/o cough and scratchy throat. Though improved.  Pt has not heard anything about the sleep study yet, so he has not gotten it done. Still having significant daytime drowsiness. Still having major snoring along with continuous of breath stopping per spells.  Compliant with blood pressure medicine. No obvious side effects. Watching salt intake. Walking some.  Diminished mood. Feeling down at times. Very irritable. Progressive over the past year. No suicidal thoughts. Would like to try medicine. [phone]   Review of Systems No chest pain no back pain no abdominal pain no change in bowel habits no blood in stool ROS 1    Objective:   Physical Exam Alert no acute distress. HEENT normal. Neck supple. Lungs clear. Heart regular in rhythm. Abdomen benign. Blood pressure good on repeat.       Assessment & Plan:  Impression 1 hypertension good control discussed #2 status post choking spell none further discuss. #3 reflux improved. #4 depression with element of anxiety discussed. Plan exercise encourage. Maintain same medications. Recheck in 3 months. Diet discussed. Initiate Wellbutrin as directed. WSL

## 2013-07-04 ENCOUNTER — Telehealth: Payer: Self-pay | Admitting: Family Medicine

## 2013-07-04 NOTE — Telephone Encounter (Signed)
Rx prior auth APPROVED for pt's BUPROPION SR, expires 07/04/2013 through [facility], faxed approval to [facility]

## 2013-07-06 ENCOUNTER — Telehealth: Payer: Self-pay | Admitting: Family Medicine

## 2013-07-06 MED ORDER — LEVOFLOXACIN 500 MG PO TABS: 500.0000 mg | ORAL_TABLET | Freq: Every day | ORAL | Status: DC

## 2013-07-06 NOTE — Telephone Encounter (Signed)
Was prescribed Augmentin 875 mg BID x 10 days  

## 2013-07-06 NOTE — Telephone Encounter (Signed)
Lev 500 qd for ten d 

## 2013-07-06 NOTE — Telephone Encounter (Signed)
Med sent to pharm. Pt notified.  

## 2013-07-06 NOTE — Telephone Encounter (Signed)
Patient was seen on 06/15/13 for rhinosinusitis and is still having a sore throat. He wants to know if we can call him in a different antibiotic.   [facility]

## 2013-07-08 ENCOUNTER — Other Ambulatory Visit: Payer: Self-pay | Admitting: Family Medicine

## 2013-07-09 NOTE — Telephone Encounter (Signed)
Last seen 07/02/13

## 2013-09-06 ENCOUNTER — Other Ambulatory Visit: Payer: Self-pay | Admitting: *Deleted

## 2013-09-06 MED ORDER — LISINOPRIL 20 MG PO TABS: 20.0000 mg | ORAL_TABLET | Freq: Two times a day (BID) | ORAL | Status: DC

## 2013-09-18 ENCOUNTER — Ambulatory Visit (INDEPENDENT_AMBULATORY_CARE_PROVIDER_SITE_OTHER): Payer: 59 | Admitting: Family Medicine

## 2013-09-18 ENCOUNTER — Encounter: Payer: Self-pay | Admitting: Family Medicine

## 2013-09-18 VITALS — BP 158/84 | Temp 97.8°F | Ht 72.0 in | Wt 201.0 lb

## 2013-09-18 DIAGNOSIS — F3289 Other specified depressive episodes: Secondary | ICD-10-CM

## 2013-09-18 DIAGNOSIS — Z0289 Encounter for other administrative examinations: Secondary | ICD-10-CM

## 2013-09-18 DIAGNOSIS — J329 Chronic sinusitis, unspecified: Secondary | ICD-10-CM

## 2013-09-18 DIAGNOSIS — F32A Depression, unspecified: Secondary | ICD-10-CM

## 2013-09-18 DIAGNOSIS — K219 Gastro-esophageal reflux disease without esophagitis: Secondary | ICD-10-CM

## 2013-09-18 DIAGNOSIS — F329 Major depressive disorder, single episode, unspecified: Secondary | ICD-10-CM

## 2013-09-18 DIAGNOSIS — J310 Chronic rhinitis: Secondary | ICD-10-CM

## 2013-09-18 DIAGNOSIS — I10 Essential (primary) hypertension: Secondary | ICD-10-CM

## 2013-09-18 MED ORDER — ESCITALOPRAM OXALATE 10 MG PO TABS: 10.0000 mg | ORAL_TABLET | Freq: Every day | ORAL | Status: DC

## 2013-09-18 MED ORDER — CEFDINIR 300 MG PO CAPS: 300.0000 mg | ORAL_CAPSULE | Freq: Two times a day (BID) | ORAL | Status: DC

## 2013-09-18 NOTE — Progress Notes (Signed)
   Subjective:    Patient ID: Anthony [name]., male    DOB: 21-Jan-1955, 58 y.o.   MRN: [id number]  HPI Follow up on depression. Taking wellbutrin 150mg  BID for 2 months.  Cough and congestion productive of yellowish phlegm. Diminished energy. Frontal headache. Scratchy throat and ear pain. Started about 8/18.   Needs FMLA filled out for the days he missed when he was sick.   Irritability ongoing, and no homicidal or suicidal thoughts.  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 Review of Systems No vomiting no diarrhea no rash elsewhere no abdominal pain no change in bowel habits no blood in stool ROS otherwise negative    Objective:   Physical Exam Alert no apparent distress. Lungs clear. Heart regular in rhythm. H&T moderate his congestion frontal tenderness. Oriented x3. Mood appropriate. Blood pressure initially elevated at 154/94 improved to 148/88 on repeat       Assessment & Plan:  Impression hypertension discussed elevated somewhat not high enough for medicines #2 rhinosinusitis likely with underlying allergy component. #3 irritability with element of depression/anxiety minimal response will become long discussion held plan stop all due to. Start Lexapro 10 every morning. Maintain same blood pressure meds. Omnicef twice a day 10 days. Symptomatic care discussed. WSL

## 2013-10-28 ENCOUNTER — Other Ambulatory Visit: Payer: Self-pay | Admitting: Family Medicine

## 2013-11-10 ENCOUNTER — Other Ambulatory Visit: Payer: Self-pay | Admitting: Family Medicine

## 2013-11-12 NOTE — Telephone Encounter (Signed)
6 mo worth ok 

## 2014-03-14 ENCOUNTER — Telehealth: Payer: Self-pay | Admitting: Family Medicine

## 2014-03-14 DIAGNOSIS — Z79899 Other long term (current) drug therapy: Secondary | ICD-10-CM

## 2014-03-14 DIAGNOSIS — E785 Hyperlipidemia, unspecified: Secondary | ICD-10-CM

## 2014-03-14 DIAGNOSIS — I10 Essential (primary) hypertension: Secondary | ICD-10-CM

## 2014-03-14 DIAGNOSIS — Z125 Encounter for screening for malignant neoplasm of prostate: Secondary | ICD-10-CM

## 2014-03-14 NOTE — Telephone Encounter (Signed)
Lip li m7 psa

## 2014-03-14 NOTE — Telephone Encounter (Signed)
Pt has physical scheduled here on 04/03/14 with Dr. Richardson Landry, needs BW orders, please call pt when done

## 2014-03-15 NOTE — Telephone Encounter (Signed)
Patient notified and verbalized understanding. 

## 2014-03-15 NOTE — Telephone Encounter (Signed)
[facility] 2/26 (BW orders are in at [facility])

## 2014-03-16 ENCOUNTER — Other Ambulatory Visit: Payer: Self-pay | Admitting: Family Medicine

## 2014-03-17 ENCOUNTER — Other Ambulatory Visit: Payer: Self-pay | Admitting: Family Medicine

## 2014-03-23 ENCOUNTER — Other Ambulatory Visit: Payer: Self-pay | Admitting: Family Medicine

## 2014-03-30 LAB — PSA: PSA: 0.7 ng/mL (ref 0.0–4.0)

## 2014-03-30 LAB — HEPATIC FUNCTION PANEL
ALT: 27 IU/L (ref 0–44)
AST: 24 IU/L (ref 0–40)
Albumin: 4.3 g/dL (ref 3.5–5.5)
Alkaline Phosphatase: 70 IU/L (ref 39–117)
BILIRUBIN, DIRECT: 0.1 mg/dL (ref 0.00–0.40)
Bilirubin Total: 0.4 mg/dL (ref 0.0–1.2)
TOTAL PROTEIN: 6.4 g/dL (ref 6.0–8.5)

## 2014-03-30 LAB — BASIC METABOLIC PANEL
BUN/Creatinine Ratio: 10 (ref 9–20)
BUN: 10 mg/dL (ref 6–24)
CHLORIDE: 103 mmol/L (ref 97–108)
CO2: 24 mmol/L (ref 18–29)
CREATININE: 1.05 mg/dL (ref 0.76–1.27)
Calcium: 9.1 mg/dL (ref 8.7–10.2)
GFR calc Af Amer: 90 mL/min/{1.73_m2} (ref >59)
GFR, EST NON AFRICAN AMERICAN: 78 mL/min/{1.73_m2} (ref >59)
Glucose: 87 mg/dL (ref 65–99)
Potassium: 4.3 mmol/L (ref 3.5–5.2)
SODIUM: 143 mmol/L (ref 134–144)

## 2014-03-30 LAB — LIPID PANEL
CHOL/HDL RATIO: 6.4 ratio — AB (ref 0.0–5.0)
Cholesterol, Total: 206 mg/dL — ABNORMAL HIGH (ref 100–199)
HDL: 32 mg/dL — AB (ref >39)
LDL Calculated: 139 mg/dL — ABNORMAL HIGH (ref 0–99)
Triglycerides: 174 mg/dL — ABNORMAL HIGH (ref 0–149)
VLDL CHOLESTEROL CAL: 35 mg/dL (ref 5–40)

## 2014-04-03 ENCOUNTER — Ambulatory Visit (INDEPENDENT_AMBULATORY_CARE_PROVIDER_SITE_OTHER): Payer: 59 | Admitting: Family Medicine

## 2014-04-03 ENCOUNTER — Encounter: Payer: Self-pay | Admitting: Family Medicine

## 2014-04-03 VITALS — BP 150/90 | Ht 71.0 in | Wt 204.0 lb

## 2014-04-03 DIAGNOSIS — K219 Gastro-esophageal reflux disease without esophagitis: Secondary | ICD-10-CM | POA: Diagnosis not present

## 2014-04-03 DIAGNOSIS — G4733 Obstructive sleep apnea (adult) (pediatric): Secondary | ICD-10-CM | POA: Diagnosis not present

## 2014-04-03 DIAGNOSIS — Z0000 Encounter for general adult medical examination without abnormal findings: Secondary | ICD-10-CM | POA: Diagnosis not present

## 2014-04-03 DIAGNOSIS — E785 Hyperlipidemia, unspecified: Secondary | ICD-10-CM

## 2014-04-03 DIAGNOSIS — I10 Essential (primary) hypertension: Secondary | ICD-10-CM | POA: Diagnosis not present

## 2014-04-03 MED ORDER — TRIAMCINOLONE ACETONIDE 0.1 % EX CREA: 1.0000 "application " | TOPICAL_CREAM | Freq: Two times a day (BID) | CUTANEOUS | Status: DC

## 2014-04-03 MED ORDER — LISINOPRIL 20 MG PO TABS: 20.0000 mg | ORAL_TABLET | Freq: Two times a day (BID) | ORAL | Status: DC

## 2014-04-03 MED ORDER — VERAPAMIL HCL ER 240 MG PO CP24
240.0000 mg | ORAL_CAPSULE | Freq: Every day | ORAL | Status: DC
Start: 2014-04-03 — End: 2014-10-02

## 2014-04-03 MED ORDER — ESCITALOPRAM OXALATE 10 MG PO TABS: 10.0000 mg | ORAL_TABLET | Freq: Every day | ORAL | Status: DC

## 2014-04-03 MED ORDER — EZETIMIBE 10 MG PO TABS: 10.0000 mg | ORAL_TABLET | Freq: Every day | ORAL | Status: DC

## 2014-04-03 NOTE — Progress Notes (Signed)
Subjective:    Patient ID: Anthony [name]., male    DOB: February 15, 1955, 59 y.o.   MRN: [id number]  HPI The patient comes in today for a wellness visit.  Results for orders placed or performed in visit on 03/14/14  Lipid panel  Result Value Ref Range   Cholesterol, Total 206 (H) 100 - 199 mg/dL   Triglycerides 174 (H) 0 - 149 mg/dL   HDL 32 (L) >39 mg/dL   VLDL Cholesterol Cal 35 5 - 40 mg/dL   LDL Calculated 139 (H) 0 - 99 mg/dL   Chol/HDL Ratio 6.4 (H) 0.0 - 5.0 ratio units  Hepatic function panel  Result Value Ref Range   Total Protein 6.4 6.0 - 8.5 g/dL   Albumin 4.3 3.5 - 5.5 g/dL   Bilirubin Total 0.4 0.0 - 1.2 mg/dL   Bilirubin, Direct 0.10 0.00 - 0.40 mg/dL   Alkaline Phosphatase 70 39 - 117 IU/L   AST 24 0 - 40 IU/L   ALT 27 0 - 44 IU/L  Basic metabolic panel  Result Value Ref Range   Glucose 87 65 - 99 mg/dL   BUN 10 6 - 24 mg/dL   Creatinine, Ser 1.05 0.76 - 1.27 mg/dL   GFR calc non Af Amer 78 >59 mL/min/1.73   GFR calc Af Amer 90 >59 mL/min/1.73   BUN/Creatinine Ratio 10 9 - 20   Sodium 143 134 - 144 mmol/L   Potassium 4.3 3.5 - 5.2 mmol/L   Chloride 103 97 - 108 mmol/L   CO2 24 18 - 29 mmol/L   Calcium 9.1 8.7 - 10.2 mg/dL  PSA  Result Value Ref Range   PSA 0.7 0.0 - 4.0 ng/mL     A review of their health history was completed.  A review of medications was also completed.  Any needed refills; none  Eating habits: good  Falls/  MVA accidents in past few months: none  Regular exercise: yes  Specialist pt sees on regular basis: none  Preventative health issues were discussed.   Additional concerns: Patient states that he has a red spot on his leg and his skin is very dry.   Sticking with bp meds. No side effects. No recent bp cks. Watching salt intake    Reports ongoing difficulty with sleep. States Ambien definitely helps.  Patient did not follow-up for recommended visit for depression. States the medication has helped a fair  amount.   Last colonoscopy eight yrs ago  Review of Systems  Constitutional: Negative for fever, activity change and appetite change.  HENT: Negative for congestion and rhinorrhea.   Eyes: Negative for discharge.  Respiratory: Negative for cough and wheezing.   Cardiovascular: Negative for chest pain.  Gastrointestinal: Negative for vomiting, abdominal pain and blood in stool.  Genitourinary: Negative for frequency and difficulty urinating.  Musculoskeletal: Negative for neck pain.  Skin: Negative for rash.  Allergic/Immunologic: Negative for environmental allergies and food allergies.  Neurological: Negative for weakness and headaches.  Psychiatric/Behavioral: Negative for agitation.  All other systems reviewed and are negative.      Objective:   Physical Exam  Constitutional: He appears well-developed and well-nourished.  HENT:  Head: Normocephalic and atraumatic.  Right Ear: External ear normal.  Left Ear: External ear normal.  Nose: Nose normal.  Mouth/Throat: Oropharynx is clear and moist.  Eyes: EOM are normal. Pupils are equal, round, and reactive to light.  Neck: Normal range of motion. Neck supple. No thyromegaly present.  Cardiovascular:  Normal rate, regular rhythm and normal heart sounds.   No murmur heard. Pulmonary/Chest: Effort normal and breath sounds normal. No respiratory distress. He has no wheezes.  Abdominal: Soft. Bowel sounds are normal. He exhibits no distension and no mass. There is no tenderness.  Genitourinary: Penis normal.  Musculoskeletal: Normal range of motion. He exhibits no edema.  Lymphadenopathy:    He has no cervical adenopathy.  Neurological: He is alert. He exhibits normal muscle tone.  Skin: Skin is warm and dry. No erythema.  Bilateral leg eczema  Psychiatric: He has a normal mood and affect. His behavior is normal. Judgment normal.  Vitals reviewed.         Assessment & Plan:  Impression 1 wellness exam up-to-date on  colonoscopy #2 hypertension good control discussed #3 hyperlipidemia good control discussed #4 insomnia ongoing discussed #5 skin eczema discussed #6 depression improved plan diet exercise discussed. Medications maintained. Follow-up in 6 months. Hemoccult cards 3. WSL

## 2014-04-16 ENCOUNTER — Encounter: Payer: Self-pay | Admitting: Family Medicine

## 2014-04-16 ENCOUNTER — Ambulatory Visit (INDEPENDENT_AMBULATORY_CARE_PROVIDER_SITE_OTHER): Payer: 59 | Admitting: Family Medicine

## 2014-04-16 VITALS — BP 148/90 | Temp 97.5°F | Ht 71.0 in | Wt 209.0 lb

## 2014-04-16 DIAGNOSIS — B349 Viral infection, unspecified: Secondary | ICD-10-CM

## 2014-04-16 DIAGNOSIS — B9689 Other specified bacterial agents as the cause of diseases classified elsewhere: Secondary | ICD-10-CM

## 2014-04-16 DIAGNOSIS — J019 Acute sinusitis, unspecified: Secondary | ICD-10-CM | POA: Diagnosis not present

## 2014-04-16 MED ORDER — LEVOFLOXACIN 500 MG PO TABS: 500.0000 mg | ORAL_TABLET | Freq: Every day | ORAL | Status: DC

## 2014-04-16 NOTE — Progress Notes (Signed)
   Subjective:    Patient ID: Anthony Congo., male    DOB: Mar 13, 1955, 59 y.o.   MRN: [id number]  Cough This is a new problem. The current episode started yesterday. Associated symptoms include ear pain, nasal congestion, rhinorrhea and a sore throat. Pertinent negatives include no chest pain, fever or wheezing. Treatments tried: tylenol, OTC daytime cold.   Patient denies severe body aches high fever chills shortness of breath   Review of Systems  Constitutional: Negative for fever and activity change.  HENT: Positive for congestion, ear pain, rhinorrhea and sore throat.   Eyes: Negative for discharge.  Respiratory: Positive for cough. Negative for wheezing.   Cardiovascular: Negative for chest pain.       Objective:   Physical Exam  Constitutional: He appears well-developed.  HENT:  Head: Normocephalic.  Mouth/Throat: Oropharynx is clear and moist. No oropharyngeal exudate.  Neck: Normal range of motion.  Cardiovascular: Normal rate, regular rhythm and normal heart sounds.   No murmur heard. Pulmonary/Chest: Effort normal and breath sounds normal. He has no wheezes.  Lymphadenopathy:    He has no cervical adenopathy.  Neurological: He exhibits normal muscle tone.  Skin: Skin is warm and dry.  Nursing note and vitals reviewed.         Assessment & Plan:  Has a viral illness underlying that could be part of the trigger this also probable sinus infection antibiotics prescribed warning signs discuss stay away from decongestants

## 2014-04-16 NOTE — Patient Instructions (Signed)

## 2014-04-18 ENCOUNTER — Emergency Department (HOSPITAL_COMMUNITY): Payer: 59

## 2014-04-18 ENCOUNTER — Encounter (HOSPITAL_COMMUNITY): Payer: Self-pay | Admitting: Emergency Medicine

## 2014-04-18 ENCOUNTER — Emergency Department (HOSPITAL_COMMUNITY)
Admission: EM | Admit: 2014-04-18 | Discharge: 2014-04-18 | Disposition: A | Discharge status: Home / Self Care | Payer: 59 | Attending: Emergency Medicine | Admitting: Emergency Medicine

## 2014-04-18 ENCOUNTER — Telehealth: Payer: Self-pay | Admitting: Family Medicine

## 2014-04-18 DIAGNOSIS — R5383 Other fatigue: Secondary | ICD-10-CM | POA: Diagnosis not present

## 2014-04-18 DIAGNOSIS — G43909 Migraine, unspecified, not intractable, without status migrainosus: Secondary | ICD-10-CM | POA: Insufficient documentation

## 2014-04-18 DIAGNOSIS — Z79899 Other long term (current) drug therapy: Secondary | ICD-10-CM | POA: Diagnosis not present

## 2014-04-18 DIAGNOSIS — F419 Anxiety disorder, unspecified: Secondary | ICD-10-CM | POA: Diagnosis not present

## 2014-04-18 DIAGNOSIS — R059 Cough, unspecified: Secondary | ICD-10-CM

## 2014-04-18 DIAGNOSIS — I10 Essential (primary) hypertension: Secondary | ICD-10-CM | POA: Insufficient documentation

## 2014-04-18 DIAGNOSIS — R05 Cough: Secondary | ICD-10-CM | POA: Diagnosis not present

## 2014-04-18 DIAGNOSIS — M791 Myalgia: Secondary | ICD-10-CM | POA: Diagnosis not present

## 2014-04-18 DIAGNOSIS — Z8639 Personal history of other endocrine, nutritional and metabolic disease: Secondary | ICD-10-CM | POA: Diagnosis not present

## 2014-04-18 DIAGNOSIS — R0981 Nasal congestion: Secondary | ICD-10-CM | POA: Insufficient documentation

## 2014-04-18 DIAGNOSIS — Z7952 Long term (current) use of systemic steroids: Secondary | ICD-10-CM | POA: Insufficient documentation

## 2014-04-18 DIAGNOSIS — Z8709 Personal history of other diseases of the respiratory system: Secondary | ICD-10-CM | POA: Insufficient documentation

## 2014-04-18 MED ORDER — GUAIFENESIN-CODEINE 100-10 MG/5ML PO SYRP
10.0000 mL | ORAL_SOLUTION | Freq: Three times a day (TID) | ORAL | Status: DC | PRN
Start: 2014-04-18 — End: 2015-02-27

## 2014-04-18 MED ORDER — OSELTAMIVIR PHOSPHATE 75 MG PO CAPS: 75.0000 mg | ORAL_CAPSULE | Freq: Two times a day (BID) | ORAL | Status: DC

## 2014-04-18 MED ORDER — ALBUTEROL SULFATE HFA 108 (90 BASE) MCG/ACT IN AERS
2.0000 | INHALATION_SPRAY | Freq: Once | RESPIRATORY_TRACT | Status: AC
  Administered 2014-04-18: 2 via RESPIRATORY_TRACT
  Filled 2014-04-18: qty 6.7

## 2014-04-18 NOTE — Telephone Encounter (Signed)
Patient notified and verbalized understanding.Call back if s/s worsen

## 2014-04-18 NOTE — Telephone Encounter (Signed)
Already on powerful abx, in retrospect likely the flu, rec tamiflu 75 bid for five d

## 2014-04-18 NOTE — Telephone Encounter (Signed)
Pt seen Tuesday with Dr. Nicki Reaper. DX viral syndrome and sinusitis. RX levaquin

## 2014-04-18 NOTE — ED Notes (Addendum)
Pt c/o body aches, fever and cough that started 3 days ago. Pt seen on 3/29 by PCP, placed on abx. Pt called PCP today. Prescription for Tamiflu sent to pharm.

## 2014-04-18 NOTE — Telephone Encounter (Signed)
Pt called stating that he is feeling worse. Pt has a really bad cough,chest congestion, Body aches and headache. Pt is wanting to know if something else can be called in  Or if he needs to be seen,  [facility]

## 2014-04-18 NOTE — Discharge Instructions (Signed)
Cough, Adult   A cough is a reflex. It helps you clear your throat and airways. A cough can help heal your body. A cough can last 2 or 3 weeks (acute) or may last more than 8 weeks (chronic). Some common causes of a cough can include an infection, allergy, or a cold.  HOME CARE  · Only take medicine as told by your doctor.  · If given, take your medicines (antibiotics) as told. Finish them even if you start to feel better.  · Use a cold steam vaporizer or humidifier in your home. This can help loosen thick spit (secretions).  · Sleep so you are almost sitting up (semi-upright). Use pillows to do this. This helps reduce coughing.  · Rest as needed.  · Stop smoking if you smoke.  GET HELP RIGHT AWAY IF:  · You have yellowish-white fluid (pus) in your thick spit.  · Your cough gets worse.  · Your medicine does not reduce coughing, and you are losing sleep.  · You cough up blood.  · You have trouble breathing.  · Your pain gets worse and medicine does not help.  · You have a fever.  MAKE SURE YOU:   · Understand these instructions.  · Will watch your condition.  · Will get help right away if you are not doing well or get worse.  Document Released: 09/17/2010 Document Revised: 05/21/2013 Document Reviewed: 09/17/2010  [employer]® Patient Information ©2015 [employer]. This information is not intended to replace advice given to you by your health care provider. Make sure you discuss any questions you have with your health care provider.

## 2014-04-19 ENCOUNTER — Encounter: Payer: Self-pay | Admitting: Family Medicine

## 2014-04-19 NOTE — ED Provider Notes (Signed)
CSN: [id number]     Arrival date & time 04/18/14  1359 History   First MD Initiated Contact with Patient 04/18/14 1407     Chief Complaint  Patient presents with  . Cough     (Consider location/radiation/quality/duration/timing/severity/associated sxs/prior Treatment) HPI   Anthony [name]. is a 59 y.o. male who presents to the Emergency Department complaining of cough, fever, body aches for 3 days.  Reports sudden onset.  States cough is occasionally productive.  Has been taking Vitamin C and alternating tylenol and ibuprofen.  He denies shortness of breath, chest pain, vomiting, diarrhea, dysuria or rash.  Nothing makes the symptoms better or worse.   Past Medical History  Diagnosis Date  . Hypertension   . Hyperlipidemia   . Migraine headache   . Back pain   . Insomnia   . Reflux   . Anxiety   . Venous stasis   . Sinusitis    Past Surgical History  Procedure Laterality Date  . Ankle surgery    . Abdominal surgery     History reviewed. No pertinent family history. History  Substance Use Topics  . Smoking status: Never Smoker   . Smokeless tobacco: Not on file  . Alcohol Use: No    Review of Systems  Constitutional: Positive for fever, chills and fatigue. Negative for activity change and appetite change.  HENT: Positive for congestion and rhinorrhea. Negative for facial swelling, sore throat, trouble swallowing and voice change.   Eyes: Negative for visual disturbance.  Respiratory: Positive for cough. Negative for chest tightness, shortness of breath, wheezing and stridor.   Gastrointestinal: Negative for nausea, vomiting and abdominal pain.  Genitourinary: Negative for dysuria, hematuria and flank pain.  Musculoskeletal: Positive for myalgias. Negative for neck pain and neck stiffness.  Skin: Negative.  Negative for rash.  Neurological: Negative for dizziness, weakness, numbness and headaches.  Hematological: Negative for adenopathy.  Psychiatric/Behavioral:  Negative for confusion.  All other systems reviewed and are negative.     Allergies  Shellfish allergy; Apple; Asa; Codeine; Lipitor; Nsaids; and Zocor  Home Medications   Prior to Admission medications   Medication Sig Start Date End Date Taking? Authorizing Provider  ALPRAZolam Duanne Moron) 0.5 MG tablet Take 1 tablet (0.5 mg total) by mouth daily as needed for anxiety. 12/21/12   Mikey Kirschner, MD  Ascorbic Acid (VITAMIN C) 1000 MG tablet Take 1,000 mg by mouth daily.    Historical Provider, MD  diclofenac (VOLTAREN) 75 MG EC tablet take 1 tablet by mouth twice a day with food 03/25/14   Mikey Kirschner, MD  escitalopram (LEXAPRO) 10 MG tablet Take 1 tablet (10 mg total) by mouth daily. 04/03/14   Mikey Kirschner, MD  ezetimibe (ZETIA) 10 MG tablet Take 1 tablet (10 mg total) by mouth daily. 04/03/14   Mikey Kirschner, MD  fluticasone Asencion Islam) 50 MCG/ACT nasal spray Two sprays ea nostril daily 06/15/13 06/15/14  Mikey Kirschner, MD  guaiFENesin-codeine [facility]) 100-10 MG/5ML syrup Take 10 mLs by mouth 3 (three) times daily as needed. 04/18/14   Tammi Tashema Tiller, PA-C  levofloxacin (LEVAQUIN) 500 MG tablet Take 1 tablet (500 mg total) by mouth daily. 04/16/14   Kathyrn Drown, MD  lisinopril (PRINIVIL,ZESTRIL) 20 MG tablet Take 1 tablet (20 mg total) by mouth 2 (two) times daily. 04/03/14   Mikey Kirschner, MD  loratadine (CLARITIN) 10 MG tablet Take 10 mg by mouth daily.    Historical Provider, MD  Multiple  Vitamin (MULTIVITAMIN) tablet Take 1 tablet by mouth daily.    Historical Provider, MD  oseltamivir (TAMIFLU) 75 MG capsule Take 1 capsule (75 mg total) by mouth 2 (two) times daily. 04/18/14   Mikey Kirschner, MD  SUMAtriptan (IMITREX) 50 MG tablet Take 1 tablet at first sign of headache, may take 2 tablets 2 hours later if needed 05/09/12   Mikey Kirschner, MD  triamcinolone cream (KENALOG) 0.1 % Apply 1 application topically 2 (two) times daily. 04/03/14   Mikey Kirschner, MD   verapamil (VERELAN PM) 240 MG 24 hr capsule Take 1 capsule (240 mg total) by mouth daily. 04/03/14   Mikey Kirschner, MD  vitamin E 400 UNIT capsule Take 400 Units by mouth daily.    Historical Provider, MD  zolpidem (AMBIEN) 10 MG tablet take 1 tablet by mouth at bedtime for sleep 11/12/13   Mikey Kirschner, MD   BP 188/89 mmHg  Pulse 97  Temp(Src) 99.2 F (37.3 C) (Oral)  Resp 14  Ht 6' (1.829 m)  Wt 200 lb (90.719 kg)  BMI 27.12 kg/m2  SpO2 94% Physical Exam  Constitutional: He is oriented to person, place, and time. He appears well-developed and well-nourished. No distress.  HENT:  Head: Normocephalic and atraumatic.  Right Ear: Tympanic membrane and ear canal normal.  Left Ear: Tympanic membrane and ear canal normal.  Nose: Mucosal edema present. No rhinorrhea.  Mouth/Throat: Uvula is midline and mucous membranes are normal. No trismus in the jaw. No uvula swelling. Posterior oropharyngeal erythema present. No oropharyngeal exudate, posterior oropharyngeal edema or tonsillar abscesses.  Eyes: Conjunctivae are normal.  Neck: Normal range of motion and phonation normal. Neck supple. No Brudzinski's sign and no Kernig's sign noted.  Cardiovascular: Normal rate, regular rhythm, normal heart sounds and intact distal pulses.   No murmur heard. Pulmonary/Chest: Effort normal. No respiratory distress. He has no wheezes. He has no rales.  coarse lung sounds bilaterally, no rales or wheezing  Abdominal: Soft. He exhibits no distension. There is no tenderness. There is no rebound and no guarding.  Musculoskeletal: He exhibits no edema.  Lymphadenopathy:    He has no cervical adenopathy.  Neurological: He is alert and oriented to person, place, and time. He exhibits normal muscle tone. Coordination normal.  Skin: Skin is warm and dry.  Psychiatric: He has a normal mood and affect.  Nursing note and vitals reviewed.   ED Course  Procedures (including critical care time) Labs  Review Labs Reviewed - No data to display  Imaging Review No results found.   EKG Interpretation None      MDM   Final diagnoses:  Cough    Pt is well appearing, non-toxic.  Vitals stable.  Has been seen by his PCP this week for same and currently taking Levaquin although patient did not mention this to me (saw PCP note in Epic).  Sx's likely viral.  Albuterol inhaler dispensed.  Pt agrees to fluids, continue tylenol and ibuprofen and PCP f/u if needed    Patrice Paradise, PA-C 04/19/14 1657  Ripley Fraise, MD 04/20/14 [phone]

## 2014-04-22 ENCOUNTER — Telehealth: Payer: Self-pay | Admitting: Family Medicine

## 2014-04-22 MED ORDER — MAGIC MOUTHWASH
ORAL | Status: DC
Start: 2014-04-22 — End: 2015-02-27

## 2014-04-22 MED ORDER — MAGIC MOUTHWASH: ORAL | Status: DC

## 2014-04-22 NOTE — Telephone Encounter (Signed)
Pt diagnosed with the flu, hospital issued guaiFENesin-codeine (ROBITUSSIN AC) 100-10 MG/5ML syrup We issued the Levaquin an Tamiflu (he did not fill this one)  His mouth is now covered in thrush, can we call him in some mouth wash  [facility]

## 2014-04-22 NOTE — Telephone Encounter (Signed)
D m m w one tsblspoons swish and spit qid 12 oz

## 2014-04-22 NOTE — Telephone Encounter (Signed)
Rx sent electronically to pharmacy. Patient notified. 

## 2014-05-16 ENCOUNTER — Telehealth: Payer: Self-pay | Admitting: Family Medicine

## 2014-05-16 NOTE — Telephone Encounter (Signed)
Patient states he takes 2 tablets a day everyday for his arm and would like the number increased to 60 a month

## 2014-05-16 NOTE — Telephone Encounter (Signed)
60 one bid five ref

## 2014-05-16 NOTE — Telephone Encounter (Signed)
Nurses, please discuss with the patient Dr. Richardson Landry is out of the office today. Find out from the patient does he take this medicine twice daily on a longitudinal long-term basis? If so then may prescribed 60 tablets 1 twice a day with 3 refills

## 2014-05-16 NOTE — Telephone Encounter (Signed)
diclofenac (VOLTAREN) 75 MG EC tablet  Pt states he needs a refill on this med please  Out of the med completely   [facility]   States that at 42 tabs he always runs out with the dosage  At BID, wants to know why we only dispense 42 tabs

## 2014-05-17 ENCOUNTER — Other Ambulatory Visit: Payer: Self-pay | Admitting: Family Medicine

## 2014-05-17 MED ORDER — DICLOFENAC SODIUM 75 MG PO TBEC: 75.0000 mg | DELAYED_RELEASE_TABLET | Freq: Two times a day (BID) | ORAL | Status: DC

## 2014-05-17 NOTE — Telephone Encounter (Signed)
Ok six mo worth 

## 2014-05-17 NOTE — Telephone Encounter (Signed)
Left message on voicemail notifying patient that medication has been sent to the pharmacy.

## 2014-05-27 ENCOUNTER — Telehealth: Payer: Self-pay | Admitting: Family Medicine

## 2014-05-27 NOTE — Telephone Encounter (Signed)
Pt dropped off a form to be filled out and faxed to [facility] [employer].

## 2014-05-28 NOTE — Telephone Encounter (Signed)
Form done, plz fax, and let pt know done

## 2014-08-15 ENCOUNTER — Telehealth: Payer: Self-pay | Admitting: Family Medicine

## 2014-08-15 DIAGNOSIS — E785 Hyperlipidemia, unspecified: Secondary | ICD-10-CM

## 2014-08-15 DIAGNOSIS — Z79899 Other long term (current) drug therapy: Secondary | ICD-10-CM

## 2014-08-15 NOTE — Telephone Encounter (Signed)
bw orders ready.pt notified.

## 2014-08-15 NOTE — Telephone Encounter (Signed)
Lip liv 

## 2014-08-15 NOTE — Telephone Encounter (Signed)
03/29/14 lip, liver, bmp, psa

## 2014-08-15 NOTE — Telephone Encounter (Signed)
Pt has a 6 month follow up scheduled for 10/02/14, wants to know if he needs to have any labs done, please advise and call pt

## 2014-09-28 LAB — HEPATIC FUNCTION PANEL
ALBUMIN: 4 g/dL (ref 3.5–5.5)
ALT: 33 IU/L (ref 0–44)
AST: 24 IU/L (ref 0–40)
Alkaline Phosphatase: 72 IU/L (ref 39–117)
Bilirubin Total: 0.5 mg/dL (ref 0.0–1.2)
Bilirubin, Direct: 0.14 mg/dL (ref 0.00–0.40)
Total Protein: 6.2 g/dL (ref 6.0–8.5)

## 2014-09-28 LAB — LIPID PANEL
CHOL/HDL RATIO: 5.8 ratio — AB (ref 0.0–5.0)
Cholesterol, Total: 204 mg/dL — ABNORMAL HIGH (ref 100–199)
HDL: 35 mg/dL — ABNORMAL LOW (ref >39)
LDL CALC: 147 mg/dL — AB (ref 0–99)
Triglycerides: 110 mg/dL (ref 0–149)
VLDL Cholesterol Cal: 22 mg/dL (ref 5–40)

## 2014-10-01 ENCOUNTER — Ambulatory Visit: Payer: 59 | Admitting: Family Medicine

## 2014-10-02 ENCOUNTER — Encounter: Payer: Self-pay | Admitting: Family Medicine

## 2014-10-02 ENCOUNTER — Ambulatory Visit (INDEPENDENT_AMBULATORY_CARE_PROVIDER_SITE_OTHER): Payer: 59 | Admitting: Family Medicine

## 2014-10-02 VITALS — BP 142/86 | Ht 71.0 in | Wt 209.2 lb

## 2014-10-02 DIAGNOSIS — I10 Essential (primary) hypertension: Secondary | ICD-10-CM | POA: Diagnosis not present

## 2014-10-02 DIAGNOSIS — E785 Hyperlipidemia, unspecified: Secondary | ICD-10-CM

## 2014-10-02 DIAGNOSIS — J019 Acute sinusitis, unspecified: Secondary | ICD-10-CM | POA: Diagnosis not present

## 2014-10-02 DIAGNOSIS — F329 Major depressive disorder, single episode, unspecified: Secondary | ICD-10-CM

## 2014-10-02 DIAGNOSIS — K2100 Gastro-esophageal reflux disease with esophagitis, without bleeding: Secondary | ICD-10-CM

## 2014-10-02 DIAGNOSIS — B9689 Other specified bacterial agents as the cause of diseases classified elsewhere: Secondary | ICD-10-CM

## 2014-10-02 DIAGNOSIS — F32A Depression, unspecified: Secondary | ICD-10-CM

## 2014-10-02 MED ORDER — FLUTICASONE PROPIONATE 50 MCG/ACT NA SUSP
NASAL | Status: DC
Start: 2014-10-02 — End: 2015-04-04

## 2014-10-02 MED ORDER — EZETIMIBE 10 MG PO TABS: 10.0000 mg | ORAL_TABLET | Freq: Every day | ORAL | Status: DC

## 2014-10-02 MED ORDER — SUMATRIPTAN SUCCINATE 50 MG PO TABS: ORAL_TABLET | ORAL | Status: DC

## 2014-10-02 MED ORDER — AMOXICILLIN-POT CLAVULANATE 875-125 MG PO TABS: ORAL_TABLET | ORAL | Status: DC

## 2014-10-02 MED ORDER — VERAPAMIL HCL ER 240 MG PO CP24: 240.0000 mg | ORAL_CAPSULE | Freq: Every day | ORAL | Status: DC

## 2014-10-02 MED ORDER — LISINOPRIL 20 MG PO TABS: 20.0000 mg | ORAL_TABLET | Freq: Two times a day (BID) | ORAL | Status: DC

## 2014-10-02 MED ORDER — AZITHROMYCIN 250 MG PO TABS: ORAL_TABLET | ORAL | Status: DC

## 2014-10-02 MED ORDER — DICLOFENAC SODIUM 75 MG PO TBEC: 75.0000 mg | DELAYED_RELEASE_TABLET | Freq: Two times a day (BID) | ORAL | Status: DC

## 2014-10-02 NOTE — Progress Notes (Signed)
   Subjective:    Patient ID: Anthony Poll., male    DOB: [birth year], 59 y.o.   MRN: [id number] Patient arrives to the office to discuss at least 4 separate issues.   Hypertension This is a chronic problem. The current episode started more than 1 year ago. There are no compliance problems.    claims compliance with blood pressure medication. Has cut down salt intake. Generally does not miss a dose. Blood pressure usually good when checked elsewhere.  Headache frontal congestion and sore throat nasal discharge for the past 7-10 days bringing up phlegm.  Patient is in today for a 6 month follow up. Also having ongoing challenges with his migraine headaches. Reports a couple per week. Reports some increased stress. States he would like a refill on Imitrex. migr more bothersome, twice per wk.  Claims compliance with his lipid medication. Has cut fats down. Does not miss a dose of his medication.  Exercise a fair amount, walks a lot  mostly good diet  Results for orders placed or performed in visit on 08/15/14  Lipid panel  Result Value Ref Range   Cholesterol, Total 204 (H) 100 - 199 mg/dL   Triglycerides 110 0 - 149 mg/dL   HDL 35 (L) >39 mg/dL   VLDL Cholesterol Cal 22 5 - 40 mg/dL   LDL Calculated 147 (H) 0 - 99 mg/dL   Chol/HDL Ratio 5.8 (H) 0.0 - 5.0 ratio units  Hepatic function panel  Result Value Ref Range   Total Protein 6.2 6.0 - 8.5 g/dL   Albumin 4.0 3.5 - 5.5 g/dL   Bilirubin Total 0.5 0.0 - 1.2 mg/dL   Bilirubin, Direct 0.14 0.00 - 0.40 mg/dL   Alkaline Phosphatase 72 39 - 117 IU/L   AST 24 0 - 40 IU/L   ALT 33 0 - 44 IU/L    Patient states no concerns this visit.   Review of Systems Positive headache no chest pain no abdominal pain no change in bowel habits positive congestion low-grade fever    Objective:   Physical Exam  Alert vitals stable blood pressure still borderline on repeat. HET moderate his congestion frontal tenderness pharynx normal neck  supple lungs clear heart regular in rhythm ankles without edema      Assessment & Plan:  Impression 1 hypertension decent control but not ideal discussed #2 hyperlipidemia good control considering cannot take statins No. 3 rhinosinusitis discussed #4 depression clinically stable #5 migraine headaches ongoing. Discussed potential for prophylaxis and the future plan all medications refilled. Augmentin twice a day 10 days. Check a few blood pressures and bring in at next visit. Blood pressure not improve will need to add additional medicines discussed offered Toprol for headaches patient wishes to hold off other meds refilled WSL

## 2014-10-04 ENCOUNTER — Encounter: Payer: Self-pay | Admitting: Family Medicine

## 2014-11-17 ENCOUNTER — Other Ambulatory Visit: Payer: Self-pay | Admitting: Family Medicine

## 2014-11-18 NOTE — Telephone Encounter (Signed)
Six mo worth ok 

## 2014-12-16 ENCOUNTER — Encounter: Payer: Self-pay | Admitting: Family Medicine

## 2014-12-16 ENCOUNTER — Ambulatory Visit (INDEPENDENT_AMBULATORY_CARE_PROVIDER_SITE_OTHER): Payer: 59 | Admitting: Family Medicine

## 2014-12-16 VITALS — Temp 97.8°F | Ht 71.0 in | Wt 209.0 lb

## 2014-12-16 DIAGNOSIS — B348 Other viral infections of unspecified site: Secondary | ICD-10-CM

## 2014-12-16 DIAGNOSIS — B338 Other specified viral diseases: Secondary | ICD-10-CM

## 2014-12-16 DIAGNOSIS — B9689 Other specified bacterial agents as the cause of diseases classified elsewhere: Secondary | ICD-10-CM

## 2014-12-16 DIAGNOSIS — J019 Acute sinusitis, unspecified: Secondary | ICD-10-CM

## 2014-12-16 MED ORDER — AMOXICILLIN-POT CLAVULANATE 875-125 MG PO TABS: ORAL_TABLET | ORAL | Status: DC

## 2014-12-16 MED ORDER — HYDROCODONE-HOMATROPINE 5-1.5 MG/5ML PO SYRP: 5.0000 mL | ORAL_SOLUTION | Freq: Four times a day (QID) | ORAL | Status: DC | PRN

## 2014-12-16 NOTE — Progress Notes (Signed)
   Subjective:    Patient ID: Anthony Poll., male    DOB: May 09, 1955, 59 y.o.   MRN: [id number]  Cough This is a new problem. The current episode started in the past 7 days. Associated symptoms include ear pain, nasal congestion, rhinorrhea and a sore throat. Pertinent negatives include no chest pain, fever or wheezing.    patient relates body aches fatigue tiredness feeling rundown low-grade fever not feeling good several days   Review of Systems  Constitutional: Negative for fever and activity change.  HENT: Positive for congestion, ear pain, rhinorrhea and sore throat.   Eyes: Negative for discharge.  Respiratory: Positive for cough. Negative for wheezing.   Cardiovascular: Negative for chest pain.       Objective:   Physical Exam  Constitutional: He appears well-developed.  HENT:  Head: Normocephalic.  Mouth/Throat: Oropharynx is clear and moist. No oropharyngeal exudate.  Neck: Normal range of motion.  Cardiovascular: Normal rate, regular rhythm and normal heart sounds.   No murmur heard. Pulmonary/Chest: Effort normal and breath sounds normal. He has no wheezes.  Lymphadenopathy:    He has no cervical adenopathy.  Neurological: He exhibits normal muscle tone.  Skin: Skin is warm and dry.  Nursing note and vitals reviewed.         Assessment & Plan:   Viral syndrome secondary rhinosinusitis probable underlying. Influenza warning signs discuss antibiotics for the sinus infection rest up over the next few days prescription cough medicine for home use only follow-up if worse

## 2015-02-27 ENCOUNTER — Ambulatory Visit (INDEPENDENT_AMBULATORY_CARE_PROVIDER_SITE_OTHER): Payer: 59 | Admitting: Family Medicine

## 2015-02-27 ENCOUNTER — Encounter: Payer: Self-pay | Admitting: Family Medicine

## 2015-02-27 VITALS — Temp 97.8°F | Ht 71.0 in | Wt 209.8 lb

## 2015-02-27 DIAGNOSIS — D1720 Benign lipomatous neoplasm of skin and subcutaneous tissue of unspecified limb: Secondary | ICD-10-CM

## 2015-02-27 NOTE — Progress Notes (Signed)
   Subjective:    Patient ID: Anthony Poll., male    DOB: 01-Sep-1955, 60 y.o.   MRN: [id number]  HPI Patient arrives with c/o knot under left arm-he just noticed last night-unsure how long he has had it.  On further history some shoulder discomfort off and on the last couple weeks he was messing with his arm and felt the area.  The actual lump. Has no obvious pain or tenderness when palpated. Shoulder pain is worse with certain motions  No cough no chest pain no shortness of breath Review of Systems No headache no abdominal pain    Objective:   Physical Exam Alert vitals stable. HEENT normal. Lungs clear. Heart regular rate and rhythm. Left axillary region soft, freely movable fat consistency in nature prominence noted in left axilla       Assessment & Plan:  Impression highly likely this is a lipoma discussed at length. Plan after discussion with pros and cons with patient we will approach this conservatively. Patient to be reassessed in 6 weeks or so for hypertension will assess area once again then. WSL

## 2015-03-20 ENCOUNTER — Other Ambulatory Visit: Payer: Self-pay | Admitting: Occupational Medicine

## 2015-03-20 ENCOUNTER — Ambulatory Visit: Payer: Self-pay

## 2015-03-20 DIAGNOSIS — Z0000 Encounter for general adult medical examination without abnormal findings: Secondary | ICD-10-CM

## 2015-04-04 ENCOUNTER — Ambulatory Visit (INDEPENDENT_AMBULATORY_CARE_PROVIDER_SITE_OTHER): Payer: 59 | Admitting: Family Medicine

## 2015-04-04 ENCOUNTER — Encounter: Payer: Self-pay | Admitting: Family Medicine

## 2015-04-04 VITALS — BP 134/96 | Ht 70.25 in | Wt 199.0 lb

## 2015-04-04 DIAGNOSIS — Z0000 Encounter for general adult medical examination without abnormal findings: Secondary | ICD-10-CM

## 2015-04-04 DIAGNOSIS — E785 Hyperlipidemia, unspecified: Secondary | ICD-10-CM

## 2015-04-04 DIAGNOSIS — R319 Hematuria, unspecified: Secondary | ICD-10-CM | POA: Diagnosis not present

## 2015-04-04 DIAGNOSIS — I10 Essential (primary) hypertension: Secondary | ICD-10-CM | POA: Diagnosis not present

## 2015-04-04 DIAGNOSIS — Z125 Encounter for screening for malignant neoplasm of prostate: Secondary | ICD-10-CM

## 2015-04-04 DIAGNOSIS — Z79899 Other long term (current) drug therapy: Secondary | ICD-10-CM | POA: Diagnosis not present

## 2015-04-04 LAB — POCT URINALYSIS DIPSTICK
PH UA: 5
Spec Grav, UA: 1.02

## 2015-04-04 MED ORDER — SUMATRIPTAN SUCCINATE 50 MG PO TABS: ORAL_TABLET | ORAL | Status: DC

## 2015-04-04 MED ORDER — DICLOFENAC SODIUM 75 MG PO TBEC: 75.0000 mg | DELAYED_RELEASE_TABLET | Freq: Two times a day (BID) | ORAL | Status: DC

## 2015-04-04 MED ORDER — VERAPAMIL HCL ER 240 MG PO CP24: 240.0000 mg | ORAL_CAPSULE | Freq: Every day | ORAL | Status: DC

## 2015-04-04 MED ORDER — ZOLPIDEM TARTRATE 10 MG PO TABS: ORAL_TABLET | ORAL | Status: DC

## 2015-04-04 NOTE — Progress Notes (Signed)
   Subjective:    Patient ID: Anthony [name]., male    DOB: [birth year], 60 y.o.   MRN: [id number]  HPI The patient comes in today for a wellness visit.    A review of their health history was completed.  A review of medications was also completed.  Any needed refills; none  Eating habits: health conscious  Falls/  MVA accidents in past few months: none  Regular exercise: walks and lifts weights  Specialist pt sees on regular basis: none  Preventative health issues were discussed.   Additional concerns: none   Patient compliant with blood pressure medication. Watching salt intake. Meds reviewed. Does not miss a dose. Next  Compliant with lipid medicine. Also notes good compliance with this. Trying to cut fats out of diet. Review of Systems  Constitutional: Negative for fever, activity change and appetite change.  HENT: Negative for congestion and rhinorrhea.   Eyes: Negative for discharge.  Respiratory: Negative for cough and wheezing.   Cardiovascular: Negative for chest pain.  Gastrointestinal: Negative for vomiting, abdominal pain and blood in stool.  Genitourinary: Negative for frequency and difficulty urinating.  Musculoskeletal: Negative for neck pain.  Skin: Negative for rash.  Allergic/Immunologic: Negative for environmental allergies and food allergies.  Neurological: Negative for weakness and headaches.  Psychiatric/Behavioral: Negative for agitation.  All other systems reviewed and are negative.      Objective:   Physical Exam  Constitutional: He appears well-developed and well-nourished.  HENT:  Head: Normocephalic and atraumatic.  Right Ear: External ear normal.  Left Ear: External ear normal.  Nose: Nose normal.  Mouth/Throat: Oropharynx is clear and moist.  Eyes: EOM are normal. Pupils are equal, round, and reactive to light.  Neck: Normal range of motion. Neck supple. No thyromegaly present.  Cardiovascular: Normal rate, regular rhythm and  normal heart sounds.   No murmur heard. Pulmonary/Chest: Effort normal and breath sounds normal. No respiratory distress. He has no wheezes.  Abdominal: Soft. Bowel sounds are normal. He exhibits no distension and no mass. There is no tenderness.  Genitourinary: Penis normal.  Musculoskeletal: Normal range of motion. He exhibits no edema.  Lymphadenopathy:    He has no cervical adenopathy.  Neurological: He is alert. He exhibits normal muscle tone.  Skin: Skin is warm and dry. No erythema.  Psychiatric: He has a normal mood and affect. His behavior is normal. Judgment normal.  Vitals reviewed.         Assessment & Plan:  Impression 1 well adult exam#2 hypertension good control discussed #3 hyperlipidemia exact control uncertain #4 microscopic hematuria. Patient brings in blood work and urine results from occupational, at that time they found red blood cells. Urine today unremarkable. Transient microscopic hematuria can be significant discussed #5 EKG with partial left bundle branch block stable advise no further workup plan referral. Appropriate blood work. Diet exercise discussed. Meds refilled. Recheck in 6 months. WSL

## 2015-04-05 LAB — LIPID PANEL
CHOL/HDL RATIO: 5.3 ratio — AB (ref 0.0–5.0)
Cholesterol, Total: 192 mg/dL (ref 100–199)
HDL: 36 mg/dL — ABNORMAL LOW (ref >39)
LDL CALC: 137 mg/dL — AB (ref 0–99)
TRIGLYCERIDES: 93 mg/dL (ref 0–149)
VLDL Cholesterol Cal: 19 mg/dL (ref 5–40)

## 2015-04-05 LAB — BASIC METABOLIC PANEL
BUN/Creatinine Ratio: 15 (ref 9–20)
BUN: 15 mg/dL (ref 6–24)
CHLORIDE: 104 mmol/L (ref 96–106)
CO2: 24 mmol/L (ref 18–29)
Calcium: 9.5 mg/dL (ref 8.7–10.2)
Creatinine, Ser: 1.03 mg/dL (ref 0.76–1.27)
GFR calc non Af Amer: 79 mL/min/{1.73_m2} (ref >59)
GFR, EST AFRICAN AMERICAN: 91 mL/min/{1.73_m2} (ref >59)
Glucose: 87 mg/dL (ref 65–99)
Potassium: 4.5 mmol/L (ref 3.5–5.2)
Sodium: 143 mmol/L (ref 134–144)

## 2015-04-05 LAB — HEPATIC FUNCTION PANEL
ALT: 40 IU/L (ref 0–44)
AST: 27 IU/L (ref 0–40)
Albumin: 4.5 g/dL (ref 3.5–5.5)
Alkaline Phosphatase: 74 IU/L (ref 39–117)
BILIRUBIN TOTAL: 0.6 mg/dL (ref 0.0–1.2)
Bilirubin, Direct: 0.16 mg/dL (ref 0.00–0.40)
Total Protein: 6.7 g/dL (ref 6.0–8.5)

## 2015-04-05 LAB — PSA: PROSTATE SPECIFIC AG, SERUM: 1 ng/mL (ref 0.0–4.0)

## 2015-04-06 ENCOUNTER — Encounter: Payer: Self-pay | Admitting: Family Medicine

## 2015-04-08 ENCOUNTER — Encounter: Payer: Self-pay | Admitting: Family Medicine

## 2015-04-18 ENCOUNTER — Ambulatory Visit (INDEPENDENT_AMBULATORY_CARE_PROVIDER_SITE_OTHER): Payer: 59 | Admitting: Family Medicine

## 2015-04-18 ENCOUNTER — Encounter: Payer: Self-pay | Admitting: Family Medicine

## 2015-04-18 VITALS — BP 152/98 | Temp 98.5°F | Ht 71.0 in | Wt 206.0 lb

## 2015-04-18 DIAGNOSIS — J329 Chronic sinusitis, unspecified: Secondary | ICD-10-CM

## 2015-04-18 DIAGNOSIS — J310 Chronic rhinitis: Secondary | ICD-10-CM

## 2015-04-18 MED ORDER — AMOXICILLIN-POT CLAVULANATE 875-125 MG PO TABS: 1.0000 | ORAL_TABLET | Freq: Two times a day (BID) | ORAL | Status: AC

## 2015-04-18 MED ORDER — HYDROCODONE-HOMATROPINE 5-1.5 MG/5ML PO SYRP: 5.0000 mL | ORAL_SOLUTION | Freq: Four times a day (QID) | ORAL | Status: DC | PRN

## 2015-04-18 NOTE — Progress Notes (Signed)
   Subjective:    Patient ID: Anthony Poll., male    DOB: 10-12-1955, 60 y.o.   MRN: [id number]  Cough This is a new problem. Episode onset: 2 days ago. Associated symptoms include chills, ear pain, headaches, myalgias and a sore throat. Associated symptoms comments: Fatigue, Congestion . He has tried nothing for the symptoms.   Wed night feeling bad  thur worse  Dim energy   No energy achey and feeling poorly  Sinus energy   Cough off an on up in the eye  Fever felt  Energy level zapped appetite minimal    Review of Systems  Constitutional: Positive for chills.  HENT: Positive for ear pain and sore throat.   Respiratory: Positive for cough.   Musculoskeletal: Positive for myalgias.  Neurological: Positive for headaches.       Objective:   Physical Exam Alert, mild malaise. Hydration good Vitals stable. frontal/ maxillary tenderness evident positive nasal congestion. pharynx normal neck supple  lungs clear/no crackles or wheezes. heart regular in rhythm        Assessment & Plan:  Impression likely post flu discussed/rhinosinusitis likely post viral, discussed with patient. plan antibiotics prescribed. Questions answered. Symptomatic care discussed. warning signs discussed. WSL

## 2015-04-21 ENCOUNTER — Encounter: Payer: Self-pay | Admitting: Family Medicine

## 2015-04-23 DIAGNOSIS — Z029 Encounter for administrative examinations, unspecified: Secondary | ICD-10-CM

## 2015-05-14 ENCOUNTER — Ambulatory Visit (INDEPENDENT_AMBULATORY_CARE_PROVIDER_SITE_OTHER): Payer: 59 | Admitting: Urology

## 2015-05-14 DIAGNOSIS — R3129 Other microscopic hematuria: Secondary | ICD-10-CM | POA: Diagnosis not present

## 2015-05-16 ENCOUNTER — Other Ambulatory Visit: Payer: Self-pay | Admitting: Urology

## 2015-05-16 DIAGNOSIS — R3129 Other microscopic hematuria: Secondary | ICD-10-CM

## 2015-05-23 ENCOUNTER — Ambulatory Visit (HOSPITAL_COMMUNITY)
Admission: RE | Admit: 2015-05-23 | Discharge: 2015-05-23 | Disposition: A | Discharge status: Home / Self Care | Payer: 59 | Source: Ambulatory Visit | Attending: Urology | Admitting: Urology

## 2015-05-23 DIAGNOSIS — M419 Scoliosis, unspecified: Secondary | ICD-10-CM | POA: Insufficient documentation

## 2015-05-23 DIAGNOSIS — K8020 Calculus of gallbladder without cholecystitis without obstruction: Secondary | ICD-10-CM | POA: Insufficient documentation

## 2015-05-23 DIAGNOSIS — N200 Calculus of kidney: Secondary | ICD-10-CM | POA: Insufficient documentation

## 2015-05-23 DIAGNOSIS — R3129 Other microscopic hematuria: Secondary | ICD-10-CM | POA: Insufficient documentation

## 2015-05-23 LAB — POCT I-STAT CREATININE: CREATININE: 1.2 mg/dL (ref 0.61–1.24)

## 2015-05-23 MED ORDER — SODIUM CHLORIDE 0.9 % IV SOLN
INTRAVENOUS | Status: AC
  Filled 2015-05-23: qty 250

## 2015-05-23 MED ORDER — DIPHENHYDRAMINE HCL 25 MG PO CAPS
50.0000 mg | ORAL_CAPSULE | Freq: Once | ORAL | Status: AC
  Administered 2015-05-23: 50 mg via ORAL
  Filled 2015-05-23: qty 2

## 2015-05-23 MED ORDER — DIPHENHYDRAMINE HCL 25 MG PO CAPS
ORAL_CAPSULE | ORAL | Status: AC
  Administered 2015-05-23: 50 mg via ORAL
  Filled 2015-05-23: qty 2

## 2015-05-23 MED ORDER — IOPAMIDOL (ISOVUE-300) INJECTION 61%
150.0000 mL | Freq: Once | INTRAVENOUS | Status: AC | PRN
  Administered 2015-05-23: 150 mL via INTRAVENOUS

## 2015-05-23 NOTE — Progress Notes (Signed)
Pt with itching after receiving IV contrast. Benadryl 50 mg PO given per Radiologist.

## 2015-06-04 ENCOUNTER — Ambulatory Visit (INDEPENDENT_AMBULATORY_CARE_PROVIDER_SITE_OTHER): Payer: 59 | Admitting: Urology

## 2015-06-04 DIAGNOSIS — N200 Calculus of kidney: Secondary | ICD-10-CM | POA: Diagnosis not present

## 2015-06-04 DIAGNOSIS — R3129 Other microscopic hematuria: Secondary | ICD-10-CM | POA: Diagnosis not present

## 2015-06-05 ENCOUNTER — Other Ambulatory Visit: Payer: Self-pay | Admitting: Family Medicine

## 2015-08-04 ENCOUNTER — Other Ambulatory Visit: Payer: Self-pay | Admitting: Family Medicine

## 2015-08-06 ENCOUNTER — Other Ambulatory Visit: Payer: Self-pay | Admitting: Urology

## 2015-08-06 DIAGNOSIS — N200 Calculus of kidney: Secondary | ICD-10-CM

## 2015-08-14 ENCOUNTER — Ambulatory Visit (HOSPITAL_COMMUNITY)
Admission: RE | Admit: 2015-08-14 | Discharge: 2015-08-14 | Disposition: A | Discharge status: Home / Self Care | Payer: 59 | Source: Ambulatory Visit | Attending: Urology | Admitting: Urology

## 2015-08-14 DIAGNOSIS — N281 Cyst of kidney, acquired: Secondary | ICD-10-CM | POA: Insufficient documentation

## 2015-08-14 DIAGNOSIS — N200 Calculus of kidney: Secondary | ICD-10-CM

## 2015-09-10 ENCOUNTER — Ambulatory Visit (INDEPENDENT_AMBULATORY_CARE_PROVIDER_SITE_OTHER): Payer: 59 | Admitting: Urology

## 2015-09-10 DIAGNOSIS — N200 Calculus of kidney: Secondary | ICD-10-CM | POA: Diagnosis not present

## 2015-09-10 DIAGNOSIS — R3129 Other microscopic hematuria: Secondary | ICD-10-CM | POA: Diagnosis not present

## 2015-10-03 ENCOUNTER — Other Ambulatory Visit: Payer: Self-pay | Admitting: Family Medicine

## 2015-10-06 ENCOUNTER — Encounter: Payer: Self-pay | Admitting: Family Medicine

## 2015-10-06 ENCOUNTER — Ambulatory Visit (INDEPENDENT_AMBULATORY_CARE_PROVIDER_SITE_OTHER): Payer: 59 | Admitting: Family Medicine

## 2015-10-06 VITALS — BP 148/94 | Ht 71.0 in | Wt 207.2 lb

## 2015-10-06 DIAGNOSIS — E785 Hyperlipidemia, unspecified: Secondary | ICD-10-CM | POA: Diagnosis not present

## 2015-10-06 DIAGNOSIS — I10 Essential (primary) hypertension: Secondary | ICD-10-CM | POA: Diagnosis not present

## 2015-10-06 DIAGNOSIS — K2100 Gastro-esophageal reflux disease with esophagitis, without bleeding: Secondary | ICD-10-CM

## 2015-10-06 DIAGNOSIS — Z79899 Other long term (current) drug therapy: Secondary | ICD-10-CM

## 2015-10-06 MED ORDER — VERAPAMIL HCL ER 240 MG PO CP24: 240.0000 mg | ORAL_CAPSULE | Freq: Every day | ORAL | 5 refills | Status: DC

## 2015-10-06 MED ORDER — SUMATRIPTAN SUCCINATE 50 MG PO TABS: ORAL_TABLET | ORAL | 5 refills | Status: DC

## 2015-10-06 MED ORDER — EZETIMIBE 10 MG PO TABS
10.0000 mg | ORAL_TABLET | Freq: Every day | ORAL | 1 refills | Status: DC
Start: 2015-10-06 — End: 2016-04-05

## 2015-10-06 MED ORDER — LISINOPRIL 20 MG PO TABS: 20.0000 mg | ORAL_TABLET | Freq: Two times a day (BID) | ORAL | 5 refills | Status: DC

## 2015-10-06 MED ORDER — DICLOFENAC SODIUM 75 MG PO TBEC: 75.0000 mg | DELAYED_RELEASE_TABLET | Freq: Two times a day (BID) | ORAL | 5 refills | Status: DC

## 2015-10-06 MED ORDER — ZOLPIDEM TARTRATE 10 MG PO TABS: ORAL_TABLET | ORAL | 5 refills | Status: DC

## 2015-10-06 NOTE — Patient Instructions (Signed)
Car apoth   [employer] automated BP cuff at car apoth

## 2015-10-06 NOTE — Progress Notes (Signed)
   Subjective:    Patient ID: Anthony Poll., male    DOB: 10-28-1955, 60 y.o.   MRN: [id number] Patient arrives office with numerous concerns Hypertension  This is a chronic problem. The current episode started more than 1 year ago. Risk factors for coronary artery disease include dyslipidemia and male gender. Treatments tried: lisinopril, verapamil. There are no compliance problems.    Patient states his throat and ears have been bothering him. Past few days, no fever, cong and dranage some, afround sick g kids,     Patient compliant with insomnia medication. Generally takes most nights. No obvious morning drowsiness. Definitely helps patient sleep. Without it patient states would not get a good nights rest.  Patient continues to take lipid medication regularly. No obvious side effects from it. Generally does not miss a dose. Prior blood work results are reviewed with patient. Patient continues to work on fat intake in diet  Self gtades diet about eight, Says physcially active with job and walks a lot, get out a lot,     Review of Systems No headache, no major weight loss or weight gain, no chest pain no back pain abdominal pain no change in bowel habits complete ROS otherwise negative     Objective:   Physical Exam  Alert vitals stable, NAD. Blood pressure good on repeat. HEENT normal. Lungs clear. Heart regular rate and rhythm.       Assessment & Plan:  Impression #1 hypertension good control discussed maintain same meds #2 hyperlipidemia prior controlled good discuss maintain same pending #3 insomnia ongoing discussed maintain same meds #4 headache stable uses Imitrex sparingly helps when the migraines come knocking plan all medications refilled. Diet exercise discussed. Patient declines flu shot.

## 2015-10-07 LAB — LIPID PANEL
CHOLESTEROL TOTAL: 222 mg/dL — AB (ref 100–199)
Chol/HDL Ratio: 6 ratio units — ABNORMAL HIGH (ref 0.0–5.0)
HDL: 37 mg/dL — AB (ref >39)
LDL CALC: 158 mg/dL — AB (ref 0–99)
TRIGLYCERIDES: 134 mg/dL (ref 0–149)
VLDL CHOLESTEROL CAL: 27 mg/dL (ref 5–40)

## 2015-10-07 LAB — HEPATIC FUNCTION PANEL
ALT: 36 IU/L (ref 0–44)
AST: 20 IU/L (ref 0–40)
Albumin: 4.5 g/dL (ref 3.6–4.8)
Alkaline Phosphatase: 65 IU/L (ref 39–117)
BILIRUBIN TOTAL: 0.6 mg/dL (ref 0.0–1.2)
Bilirubin, Direct: 0.13 mg/dL (ref 0.00–0.40)
Total Protein: 6.9 g/dL (ref 6.0–8.5)

## 2015-10-10 ENCOUNTER — Encounter: Payer: Self-pay | Admitting: Family Medicine

## 2015-10-22 ENCOUNTER — Other Ambulatory Visit: Payer: Self-pay | Admitting: Family Medicine

## 2015-10-22 NOTE — Telephone Encounter (Signed)
Ok six mo worth 

## 2016-02-13 ENCOUNTER — Other Ambulatory Visit: Payer: Self-pay | Admitting: Urology

## 2016-02-13 DIAGNOSIS — N200 Calculus of kidney: Secondary | ICD-10-CM

## 2016-03-09 ENCOUNTER — Encounter: Payer: Self-pay | Admitting: Family Medicine

## 2016-03-09 ENCOUNTER — Ambulatory Visit (INDEPENDENT_AMBULATORY_CARE_PROVIDER_SITE_OTHER): Payer: 59 | Admitting: Family Medicine

## 2016-03-09 VITALS — BP 130/88 | Temp 97.7°F | Ht 71.0 in | Wt 209.2 lb

## 2016-03-09 DIAGNOSIS — J111 Influenza due to unidentified influenza virus with other respiratory manifestations: Secondary | ICD-10-CM | POA: Diagnosis not present

## 2016-03-09 MED ORDER — OSELTAMIVIR PHOSPHATE 75 MG PO CAPS: 75.0000 mg | ORAL_CAPSULE | Freq: Two times a day (BID) | ORAL | 0 refills | Status: DC

## 2016-03-09 MED ORDER — HYDROCODONE-HOMATROPINE 5-1.5 MG/5ML PO SYRP: 5.0000 mL | ORAL_SOLUTION | Freq: Every evening | ORAL | 0 refills | Status: DC | PRN

## 2016-03-09 NOTE — Progress Notes (Signed)
   Subjective:    Patient ID: Anthony Poll., male    DOB: [birth year], 61 y.o.   MRN: [id number]  URI   This is a new problem. The current episode started yesterday. The problem has been unchanged. Maximum temperature: 99. Associated symptoms include congestion, coughing, ear pain and headaches. Associated symptoms comments: Body aches, chills. He has tried NSAIDs for the symptoms. The treatment provided no relief.   Felt tired and achey  Then feelt chills   Bad cough got real achey  No enrgy   No appetite  Muscles achey pretty rough                                                                               Take ibu 600 tid     Review of Systems  HENT: Positive for congestion and ear pain.   Respiratory: Positive for cough.   Neurological: Positive for headaches.       Objective:   Physical Exam Alert vitals reviewed, moderate malaise. Hydration good. Positive nasal congestion lungs no crackles or wheezes, no tachypnea, intermittent bronchial cough during exam heart regular rate and rhythm.        Assessment & Plan:  Impression influenza discussed at length. Petra Kuba of illness and potential sequela discussed. Plan Tamiflu prescribed if indicated and timing appropriate. Symptom care discussed. Warning signs discussed. WSL

## 2016-03-12 ENCOUNTER — Telehealth: Payer: Self-pay | Admitting: Family Medicine

## 2016-03-12 ENCOUNTER — Encounter: Payer: Self-pay | Admitting: Family Medicine

## 2016-03-12 NOTE — Telephone Encounter (Signed)
sure

## 2016-03-12 NOTE — Telephone Encounter (Signed)
Pt seen Monday with the flu Would like work excuse extended to include Saturday & Sunday to RTW Monday 03/15/16  Please advise & call when ready

## 2016-03-12 NOTE — Telephone Encounter (Signed)
Work excuse completed, notified patient.

## 2016-03-19 ENCOUNTER — Telehealth: Payer: Self-pay | Admitting: Family Medicine

## 2016-03-19 DIAGNOSIS — Z029 Encounter for administrative examinations, unspecified: Secondary | ICD-10-CM

## 2016-03-19 NOTE — Telephone Encounter (Signed)
Patient dropped off FMLA 3/2. Please review form and date/sign.

## 2016-04-05 ENCOUNTER — Ambulatory Visit (INDEPENDENT_AMBULATORY_CARE_PROVIDER_SITE_OTHER): Payer: 59 | Admitting: Family Medicine

## 2016-04-05 ENCOUNTER — Encounter: Payer: Self-pay | Admitting: Family Medicine

## 2016-04-05 VITALS — BP 132/80 | Temp 97.8°F | Wt 209.0 lb

## 2016-04-05 DIAGNOSIS — R5383 Other fatigue: Secondary | ICD-10-CM | POA: Diagnosis not present

## 2016-04-05 DIAGNOSIS — I10 Essential (primary) hypertension: Secondary | ICD-10-CM | POA: Diagnosis not present

## 2016-04-05 DIAGNOSIS — Z125 Encounter for screening for malignant neoplasm of prostate: Secondary | ICD-10-CM | POA: Diagnosis not present

## 2016-04-05 DIAGNOSIS — Z79899 Other long term (current) drug therapy: Secondary | ICD-10-CM

## 2016-04-05 DIAGNOSIS — Z0000 Encounter for general adult medical examination without abnormal findings: Secondary | ICD-10-CM

## 2016-04-05 DIAGNOSIS — Z1211 Encounter for screening for malignant neoplasm of colon: Secondary | ICD-10-CM

## 2016-04-05 DIAGNOSIS — E785 Hyperlipidemia, unspecified: Secondary | ICD-10-CM

## 2016-04-05 MED ORDER — EZETIMIBE 10 MG PO TABS: 10.0000 mg | ORAL_TABLET | Freq: Every day | ORAL | 1 refills | Status: DC

## 2016-04-05 MED ORDER — VERAPAMIL HCL ER 240 MG PO CP24: 240.0000 mg | ORAL_CAPSULE | Freq: Every day | ORAL | 5 refills | Status: DC

## 2016-04-05 MED ORDER — ALPRAZOLAM 0.5 MG PO TABS: 0.5000 mg | ORAL_TABLET | Freq: Every day | ORAL | 5 refills | Status: DC | PRN

## 2016-04-05 MED ORDER — LISINOPRIL 20 MG PO TABS: 20.0000 mg | ORAL_TABLET | Freq: Two times a day (BID) | ORAL | 5 refills | Status: DC

## 2016-04-05 MED ORDER — ZOLPIDEM TARTRATE 10 MG PO TABS: 10.0000 mg | ORAL_TABLET | Freq: Every day | ORAL | 5 refills | Status: DC

## 2016-04-05 NOTE — Progress Notes (Signed)
Subjective:     Patient ID: Anthony Poll., male   DOB: 1955-03-13, 61 y.o.   MRN: [id number]  HPI Last colonsoscopy when age 36   Patient compliant with insomnia medication. Generally takes most nights. No obvious morning drowsiness. Definitely helps patient sleep. Without it patient states would not get a good nights rest.  Blood pressure medicine and blood pressure levels reviewed today with patient. Compliant with blood pressure medicine. States does not miss a dose. No obvious side effects. Blood pressure generally good when checked elsewhere. Watching salt intake.  Patient continues to take lipid medication regularly. No obvious side effects from it. Generally does not miss a dose. Prior blood work results are reviewed with patient. Patient continues to work on fat intake in diet  overall pretty good with diet, trying to eat the right things  Plus minus diet at work, so so there    Review of Systems  Constitutional: Negative for activity change, appetite change and fever.  HENT: Negative for congestion and rhinorrhea.   Eyes: Negative for discharge.  Respiratory: Negative for cough and wheezing.   Cardiovascular: Negative for chest pain.  Gastrointestinal: Negative for abdominal pain, blood in stool and vomiting.  Genitourinary: Negative for difficulty urinating and frequency.  Musculoskeletal: Negative for neck pain.  Skin: Negative for rash.  Allergic/Immunologic: Negative for environmental allergies and food allergies.  Neurological: Negative for weakness and headaches.  Psychiatric/Behavioral: Negative for agitation.  All other systems reviewed and are negative.      Objective:   Physical Exam  Constitutional: He appears well-developed and well-nourished.  HENT:  Head: Normocephalic and atraumatic.  Right Ear: External ear normal.  Left Ear: External ear normal.  Nose: Nose normal.  Mouth/Throat: Oropharynx is clear and moist.  Eyes: EOM are normal. Pupils are  equal, round, and reactive to light.  Neck: Normal range of motion. Neck supple. No thyromegaly present.  Cardiovascular: Normal rate, regular rhythm and normal heart sounds.   No murmur heard. Pulmonary/Chest: Effort normal and breath sounds normal. No respiratory distress. He has no wheezes.  Abdominal: Soft. Bowel sounds are normal. He exhibits no distension and no mass. There is no tenderness.  Genitourinary: Penis normal.  Genitourinary Comments: Prostate wnl   Musculoskeletal: Normal range of motion. He exhibits no edema.  Lymphadenopathy:    He has no cervical adenopathy.  Neurological: He is alert. He exhibits normal muscle tone.  Skin: Skin is warm and dry. No erythema.  Psychiatric: He has a normal mood and affect. His behavior is normal. Judgment normal.  Vitals reviewed.      Assessment:    and plan Impression 1 well adult exam. Do colonoscopy. We will work on setting this up died discuss exercise discussed. Shingles vaccine discussed and prescription given. #2 hypertension good control discussed maintain same meds #3 hyperlipidemia prior blood work reviewed discussed maintain same meds #4 fatigue but diminishment in energy and libido and sexual functioning. Discussed. We'll do blood work and bring back in 2 weeks to address this    Plan:

## 2016-04-08 DIAGNOSIS — R5383 Other fatigue: Secondary | ICD-10-CM | POA: Diagnosis not present

## 2016-04-08 DIAGNOSIS — E785 Hyperlipidemia, unspecified: Secondary | ICD-10-CM | POA: Diagnosis not present

## 2016-04-08 DIAGNOSIS — Z79899 Other long term (current) drug therapy: Secondary | ICD-10-CM | POA: Diagnosis not present

## 2016-04-09 LAB — BASIC METABOLIC PANEL
BUN/Creatinine Ratio: 12 (ref 10–24)
BUN: 14 mg/dL (ref 8–27)
CALCIUM: 9.6 mg/dL (ref 8.6–10.2)
CO2: 25 mmol/L (ref 18–29)
CREATININE: 1.2 mg/dL (ref 0.76–1.27)
Chloride: 104 mmol/L (ref 96–106)
GFR calc Af Amer: 76 mL/min/{1.73_m2} (ref >59)
GFR, EST NON AFRICAN AMERICAN: 65 mL/min/{1.73_m2} (ref >59)
Glucose: 83 mg/dL (ref 65–99)
Potassium: 4 mmol/L (ref 3.5–5.2)
SODIUM: 145 mmol/L — AB (ref 134–144)

## 2016-04-09 LAB — CBC WITH DIFFERENTIAL/PLATELET
BASOS: 1 %
Basophils Absolute: 0.1 10*3/uL (ref 0.0–0.2)
EOS (ABSOLUTE): 0.5 10*3/uL — ABNORMAL HIGH (ref 0.0–0.4)
EOS: 7 %
HEMATOCRIT: 44.8 % (ref 37.5–51.0)
HEMOGLOBIN: 15.7 g/dL (ref 13.0–17.7)
IMMATURE GRANULOCYTES: 0 %
Immature Grans (Abs): 0 10*3/uL (ref 0.0–0.1)
LYMPHS: 37 %
Lymphocytes Absolute: 2.2 10*3/uL (ref 0.7–3.1)
MCH: 31.2 pg (ref 26.6–33.0)
MCHC: 35 g/dL (ref 31.5–35.7)
MCV: 89 fL (ref 79–97)
MONOCYTES: 10 %
Monocytes Absolute: 0.6 10*3/uL (ref 0.1–0.9)
NEUTROS PCT: 45 %
Neutrophils Absolute: 2.7 10*3/uL (ref 1.4–7.0)
Platelets: 181 10*3/uL (ref 150–379)
RBC: 5.04 x10E6/uL (ref 4.14–5.80)
RDW: 13.6 % (ref 12.3–15.4)
WBC: 6.1 10*3/uL (ref 3.4–10.8)

## 2016-04-09 LAB — LIPID PANEL
CHOL/HDL RATIO: 6.3 ratio — AB (ref 0.0–5.0)
Cholesterol, Total: 226 mg/dL — ABNORMAL HIGH (ref 100–199)
HDL: 36 mg/dL — ABNORMAL LOW (ref >39)
LDL Calculated: 166 mg/dL — ABNORMAL HIGH (ref 0–99)
Triglycerides: 121 mg/dL (ref 0–149)
VLDL CHOLESTEROL CAL: 24 mg/dL (ref 5–40)

## 2016-04-09 LAB — HEPATIC FUNCTION PANEL
ALBUMIN: 4.6 g/dL (ref 3.6–4.8)
ALK PHOS: 64 IU/L (ref 39–117)
ALT: 28 IU/L (ref 0–44)
AST: 24 IU/L (ref 0–40)
Bilirubin Total: 0.8 mg/dL (ref 0.0–1.2)
Bilirubin, Direct: 0.17 mg/dL (ref 0.00–0.40)
TOTAL PROTEIN: 7 g/dL (ref 6.0–8.5)

## 2016-04-09 LAB — PSA: Prostate Specific Ag, Serum: 0.8 ng/mL (ref 0.0–4.0)

## 2016-04-09 LAB — TSH: TSH: 2.72 u[IU]/mL (ref 0.450–4.500)

## 2016-04-09 LAB — TESTOSTERONE: TESTOSTERONE: 456 ng/dL (ref 264–916)

## 2016-04-10 ENCOUNTER — Encounter (HOSPITAL_COMMUNITY): Payer: Self-pay | Admitting: *Deleted

## 2016-04-10 ENCOUNTER — Emergency Department (HOSPITAL_COMMUNITY): Payer: 59

## 2016-04-10 ENCOUNTER — Emergency Department (HOSPITAL_COMMUNITY)
Admission: EM | Admit: 2016-04-10 | Discharge: 2016-04-10 | Disposition: A | Discharge status: Home / Self Care | Payer: 59 | Attending: Emergency Medicine | Admitting: Emergency Medicine

## 2016-04-10 DIAGNOSIS — I10 Essential (primary) hypertension: Secondary | ICD-10-CM | POA: Insufficient documentation

## 2016-04-10 DIAGNOSIS — Z79899 Other long term (current) drug therapy: Secondary | ICD-10-CM | POA: Insufficient documentation

## 2016-04-10 DIAGNOSIS — R51 Headache: Secondary | ICD-10-CM | POA: Insufficient documentation

## 2016-04-10 DIAGNOSIS — J111 Influenza due to unidentified influenza virus with other respiratory manifestations: Secondary | ICD-10-CM

## 2016-04-10 DIAGNOSIS — I4891 Unspecified atrial fibrillation: Secondary | ICD-10-CM | POA: Diagnosis not present

## 2016-04-10 DIAGNOSIS — R69 Illness, unspecified: Secondary | ICD-10-CM

## 2016-04-10 DIAGNOSIS — R509 Fever, unspecified: Secondary | ICD-10-CM | POA: Diagnosis not present

## 2016-04-10 DIAGNOSIS — R110 Nausea: Secondary | ICD-10-CM | POA: Diagnosis not present

## 2016-04-10 DIAGNOSIS — R05 Cough: Secondary | ICD-10-CM | POA: Diagnosis not present

## 2016-04-10 DIAGNOSIS — R0602 Shortness of breath: Secondary | ICD-10-CM | POA: Diagnosis present

## 2016-04-10 LAB — CBC WITH DIFFERENTIAL/PLATELET
Basophils Absolute: 0 10*3/uL (ref 0.0–0.1)
Basophils Relative: 0 %
EOS PCT: 2 %
Eosinophils Absolute: 0.1 10*3/uL (ref 0.0–0.7)
HCT: 43.8 % (ref 39.0–52.0)
Hemoglobin: 15.6 g/dL (ref 13.0–17.0)
LYMPHS PCT: 8 %
Lymphs Abs: 0.5 10*3/uL — ABNORMAL LOW (ref 0.7–4.0)
MCH: 31.5 pg (ref 26.0–34.0)
MCHC: 35.6 g/dL (ref 30.0–36.0)
MCV: 88.3 fL (ref 78.0–100.0)
MONO ABS: 0.6 10*3/uL (ref 0.1–1.0)
Monocytes Relative: 9 %
Neutro Abs: 4.7 10*3/uL (ref 1.7–7.7)
Neutrophils Relative %: 81 %
PLATELETS: 147 10*3/uL — AB (ref 150–400)
RBC: 4.96 MIL/uL (ref 4.22–5.81)
RDW: 11.9 % (ref 11.5–15.5)
WBC: 5.8 10*3/uL (ref 4.0–10.5)

## 2016-04-10 LAB — COMPREHENSIVE METABOLIC PANEL
ALBUMIN: 4.1 g/dL (ref 3.5–5.0)
ALK PHOS: 55 U/L (ref 38–126)
ALT: 31 U/L (ref 17–63)
AST: 28 U/L (ref 15–41)
Anion gap: 7 (ref 5–15)
BILIRUBIN TOTAL: 0.6 mg/dL (ref 0.3–1.2)
BUN: 11 mg/dL (ref 6–20)
CALCIUM: 9.1 mg/dL (ref 8.9–10.3)
CO2: 25 mmol/L (ref 22–32)
Chloride: 109 mmol/L (ref 101–111)
Creatinine, Ser: 1.19 mg/dL (ref 0.61–1.24)
GFR calc Af Amer: >60 mL/min (ref >60)
GFR calc non Af Amer: >60 mL/min (ref >60)
GLUCOSE: 133 mg/dL — AB (ref 65–99)
POTASSIUM: 3.8 mmol/L (ref 3.5–5.1)
Sodium: 141 mmol/L (ref 135–145)
TOTAL PROTEIN: 7 g/dL (ref 6.5–8.1)

## 2016-04-10 LAB — CK: CK TOTAL: 131 U/L (ref 49–397)

## 2016-04-10 MED ORDER — ACETAMINOPHEN 500 MG PO TABS
1000.0000 mg | ORAL_TABLET | Freq: Once | ORAL | Status: AC
  Administered 2016-04-10: 1000 mg via ORAL
  Filled 2016-04-10: qty 2

## 2016-04-10 MED ORDER — SODIUM CHLORIDE 0.9 % IV BOLUS (SEPSIS)
1000.0000 mL | Freq: Once | INTRAVENOUS | Status: AC
  Administered 2016-04-10: 1000 mL via INTRAVENOUS

## 2016-04-10 MED ORDER — SODIUM CHLORIDE 0.9 % IV BOLUS (SEPSIS): 1000.0000 mL | Freq: Once | INTRAVENOUS | Status: DC

## 2016-04-10 MED ORDER — APIXABAN 5 MG PO TABS: 5.0000 mg | ORAL_TABLET | Freq: Two times a day (BID) | ORAL | 0 refills | Status: DC

## 2016-04-10 NOTE — ED Triage Notes (Signed)
Pt c/o body aches, chills, nausea that started yesterday,

## 2016-04-10 NOTE — ED Provider Notes (Signed)
[facility] DEPT Provider Note   CSN: [id number] Arrival date & time: 04/10/16  0547     History   Chief Complaint Chief Complaint  Patient presents with  . Generalized Body Aches    HPI Anthony [name] is a 61 y.o. male.  HPI  61 year old male with a history of hypertension and hyperlipidemia presents with diffuse body aches. He states this started around 3 PM yesterday. Started having body aches and then has had a fever. Has had chills as well. Feels like all of his joints and extremities heard. Has been having a cough with occasional yellow sputum. Had a similar episode last month and was given Tamiflu by his doctor. No vomiting or diarrhea. No abdominal pain or chest pain. His chest feels congested and he feels like he is having some shortness of breath. Has a headache but no sore throat. No nasal congestion or rhinorrhea. He was noted to have an irregular heart rhythm and states he's been having irregular heartbeats for several months. Does not carry a diagnosis per him. Denies history of lung disease, immunosuppression, cancer, etc.  Past Medical History:  Diagnosis Date  . Anxiety   . Back pain   . Hyperlipidemia   . Hypertension   . Insomnia   . Migraine headache   . Reflux   . Sinusitis   . Venous stasis     Patient Active Problem List   Diagnosis Date Noted  . Depression 07/02/2013  . Esophageal reflux 06/05/2013  . Obstructive sleep apnea 06/05/2013  . Essential hypertension, benign 06/02/2012  . Hyperlipemia 06/02/2012    Past Surgical History:  Procedure Laterality Date  . ABDOMINAL SURGERY    . ANKLE SURGERY         Home Medications    Prior to Admission medications   Medication Sig Start Date End Date Taking? Authorizing Provider  ALPRAZolam Duanne Moron) 0.5 MG tablet Take 1 tablet (0.5 mg total) by mouth daily as needed for anxiety. 04/05/16   Mikey Kirschner, MD  Ascorbic Acid (VITAMIN C) 1000 MG tablet Take 1,000 mg by mouth daily.     Historical Provider, MD  diclofenac (VOLTAREN) 75 MG EC tablet Take 1 tablet (75 mg total) by mouth 2 (two) times daily with a meal. 10/06/15   Mikey Kirschner, MD  ezetimibe (ZETIA) 10 MG tablet Take 1 tablet (10 mg total) by mouth daily. 04/05/16   Mikey Kirschner, MD  HYDROcodone-homatropine [facility]) 5-1.5 MG/5ML syrup Take 5 mLs by mouth at bedtime as needed for cough. 03/09/16   Mikey Kirschner, MD  lisinopril (PRINIVIL,ZESTRIL) 20 MG tablet Take 1 tablet (20 mg total) by mouth 2 (two) times daily. 04/05/16   Mikey Kirschner, MD  loratadine (CLARITIN) 10 MG tablet Take 10 mg by mouth daily.    Historical Provider, MD  Multiple Vitamin (MULTIVITAMIN) tablet Take 1 tablet by mouth daily.    Historical Provider, MD  oseltamivir (TAMIFLU) 75 MG capsule Take 1 capsule (75 mg total) by mouth 2 (two) times daily. X 5 days 03/09/16   Mikey Kirschner, MD  SUMAtriptan (IMITREX) 50 MG tablet Take 1 tablet at first sign of headache, may take 2 tablets 2 hours later if needed 10/06/15   Mikey Kirschner, MD  verapamil (VERELAN PM) 240 MG 24 hr capsule Take 1 capsule (240 mg total) by mouth daily. 04/05/16   Mikey Kirschner, MD  vitamin E 400 UNIT capsule Take 400 Units by mouth daily.  Historical Provider, MD  zolpidem (AMBIEN) 10 MG tablet Take 1 tablet (10 mg total) by mouth at bedtime. 04/05/16   Mikey Kirschner, MD    Family History No family history on file.  Social History Social History  Substance Use Topics  . Smoking status: Never Smoker  . Smokeless tobacco: Never Used  . Alcohol use No     Allergies   Shellfish allergy; Apple; Asa [aspirin]; Codeine; Contrast media [iodinated diagnostic agents]; Isovue [iopamidol]; Lipitor [atorvastatin]; Nsaids; and Zocor [simvastatin]   Review of Systems Review of Systems  Constitutional: Positive for chills and fever.  Respiratory: Positive for cough and shortness of breath.   Cardiovascular: Positive for palpitations. Negative for chest  pain.  Gastrointestinal: Negative for abdominal pain, diarrhea and vomiting.  Genitourinary: Negative for dysuria.  Musculoskeletal: Positive for arthralgias and myalgias.  Neurological: Positive for headaches.  All other systems reviewed and are negative.    Physical Exam Updated Vital Signs BP (!) 142/76   Pulse (!) 117   Temp (!) 102.8 F (39.3 C) (Rectal)   Resp (!) 27   Ht 5\' 11"  (1.803 m)   Wt 202 lb (91.6 kg)   SpO2 96%   BMI 28.17 kg/m   Physical Exam  Constitutional: He is oriented to person, place, and time. He appears well-developed and well-nourished. No distress.  HENT:  Head: Normocephalic and atraumatic.  Right Ear: External ear normal.  Left Ear: External ear normal.  Nose: Nose normal.  Eyes: Right eye exhibits no discharge. Left eye exhibits no discharge.  Neck: Normal range of motion. Neck supple.  Cardiovascular: Normal heart sounds.  An irregular rhythm present. Tachycardia present.   Pulses:      Radial pulses are 2+ on the right side, and 2+ on the left side.  Pulmonary/Chest: Effort normal and breath sounds normal. He has no wheezes. He has no rales.  Abdominal: Soft. He exhibits no distension. There is no tenderness.  Musculoskeletal: He exhibits no edema.  Mild diffuse tenderness in extremities. No joint swelling or severe tenderness  Neurological: He is alert and oriented to person, place, and time.  Skin: Skin is warm and dry. He is not diaphoretic.  Nursing note and vitals reviewed.    ED Treatments / Results  Labs (all labs ordered are listed, but only abnormal results are displayed) Labs Reviewed  COMPREHENSIVE METABOLIC PANEL - Abnormal; Notable for the following:       Result Value   Glucose, Bld 133 (*)    All other components within normal limits  CBC WITH DIFFERENTIAL/PLATELET - Abnormal; Notable for the following:    Platelets 147 (*)    Lymphs Abs 0.5 (*)    All other components within normal limits  CK    EKG  EKG  Interpretation  Date/Time:  Saturday April 10 2016 06:16:58 EDT Ventricular Rate:  144 PR Interval:    QRS Duration: 115 QT Interval:  296 QTC Calculation: 459 R Axis:   -39 Text Interpretation:  Atrial fibrillation with RVR LVH with IVCD, LAD and secondary repol abnrm afib new compared to 2010 Confirmed by Rane Dumm MD, [place] [phone]) on 04/10/2016 6:25:22 AM       Radiology Dg Chest 2 View  Result Date: 04/10/2016 CLINICAL DATA:  Chills and nausea EXAM: CHEST  2 VIEW COMPARISON:  March 20, 2015 FINDINGS: There is no edema or consolidation. Heart is upper normal in size with pulmonary vascularity within normal limits. No adenopathy. There is atherosclerotic calcification in the  aorta. There is thoracic dextroscoliosis with rotatory component. There is degenerative change in the thoracic spine. IMPRESSION: Scoliosis.  No edema or consolidation.  Aortic atherosclerosis. Electronically Signed   By: Lowella Grip III M.D.   On: 04/10/2016 07:27    Procedures Procedures (including critical care time)  Medications Ordered in ED Medications  sodium chloride 0.9 % bolus 1,000 mL (not administered)  sodium chloride 0.9 % bolus 1,000 mL (1,000 mLs Intravenous New Bag/Given 04/10/16 0630)  acetaminophen (TYLENOL) tablet 1,000 mg (1,000 mg Oral Given 04/10/16 1021)     Initial Impression / Assessment and Plan / ED Course  I have reviewed the triage vital signs and the nursing notes.  Pertinent labs & imaging results that were available during my care of the patient were reviewed by me and considered in my medical decision making (see chart for details).     Patient appears to have a flu-like illness. Similar to a month ago when he was prescribed tamiflu. I don't think repeat tamiflu course would be beneficial. No signs of bacterial etiology. If this is the flu, it appears relatively mild and he has no risk factors for more severe disease. Found to have afib which is a new diagnosis for him.  However he also notes palpitations/irregular heart beat for months. Thus he is not a candidate for ED cardioversion. His CHADS2VASC is 1. However he is allergic to ASA (lip swelling). Given this, I had a discussion about anticoagulation, risks and benefits. He agrees to eliquis. Will give cardiology referral. HR went up to 140s but once he was given tylenol and fluids it is now in low 100s. Asymptomatic besides palpitations. Already on verapamil. Appears stable for discharge. Strict return precautions.  Final Clinical Impressions(s) / ED Diagnoses   Final diagnoses:  Influenza-like illness  Atrial fibrillation, new onset ([place])    New Prescriptions Discharge Medication List as of 04/10/2016  7:36 AM    START taking these medications   Details  apixaban (ELIQUIS) 5 MG TABS tablet Take 1 tablet (5 mg total) by mouth 2 (two) times daily., Starting Sat 04/10/2016, Print         Sherwood Gambler, MD 04/10/16 1550

## 2016-04-14 ENCOUNTER — Encounter: Payer: Self-pay | Admitting: Family Medicine

## 2016-04-14 ENCOUNTER — Ambulatory Visit (HOSPITAL_COMMUNITY)
Admission: RE | Admit: 2016-04-14 | Discharge: 2016-04-14 | Disposition: A | Discharge status: Home / Self Care | Payer: 59 | Source: Ambulatory Visit | Attending: Nurse Practitioner | Admitting: Nurse Practitioner

## 2016-04-14 ENCOUNTER — Ambulatory Visit (INDEPENDENT_AMBULATORY_CARE_PROVIDER_SITE_OTHER): Payer: 59 | Admitting: Family Medicine

## 2016-04-14 ENCOUNTER — Encounter (HOSPITAL_COMMUNITY): Payer: Self-pay | Admitting: Nurse Practitioner

## 2016-04-14 VITALS — BP 160/90 | HR 124 | Ht 71.0 in | Wt 203.8 lb

## 2016-04-14 VITALS — BP 148/86 | Temp 97.9°F | Ht 71.0 in | Wt 202.0 lb

## 2016-04-14 DIAGNOSIS — F321 Major depressive disorder, single episode, moderate: Secondary | ICD-10-CM | POA: Diagnosis not present

## 2016-04-14 DIAGNOSIS — F419 Anxiety disorder, unspecified: Secondary | ICD-10-CM | POA: Insufficient documentation

## 2016-04-14 DIAGNOSIS — R5383 Other fatigue: Secondary | ICD-10-CM

## 2016-04-14 DIAGNOSIS — Z886 Allergy status to analgesic agent status: Secondary | ICD-10-CM | POA: Insufficient documentation

## 2016-04-14 DIAGNOSIS — Z9889 Other specified postprocedural states: Secondary | ICD-10-CM | POA: Diagnosis not present

## 2016-04-14 DIAGNOSIS — Z79899 Other long term (current) drug therapy: Secondary | ICD-10-CM | POA: Insufficient documentation

## 2016-04-14 DIAGNOSIS — Z91013 Allergy to seafood: Secondary | ICD-10-CM | POA: Diagnosis not present

## 2016-04-14 DIAGNOSIS — I4891 Unspecified atrial fibrillation: Secondary | ICD-10-CM | POA: Diagnosis not present

## 2016-04-14 DIAGNOSIS — Z0001 Encounter for general adult medical examination with abnormal findings: Secondary | ICD-10-CM | POA: Diagnosis not present

## 2016-04-14 DIAGNOSIS — Z888 Allergy status to other drugs, medicaments and biological substances status: Secondary | ICD-10-CM | POA: Insufficient documentation

## 2016-04-14 DIAGNOSIS — Z7901 Long term (current) use of anticoagulants: Secondary | ICD-10-CM | POA: Diagnosis not present

## 2016-04-14 DIAGNOSIS — K219 Gastro-esophageal reflux disease without esophagitis: Secondary | ICD-10-CM | POA: Insufficient documentation

## 2016-04-14 DIAGNOSIS — I10 Essential (primary) hypertension: Secondary | ICD-10-CM | POA: Diagnosis not present

## 2016-04-14 DIAGNOSIS — E785 Hyperlipidemia, unspecified: Secondary | ICD-10-CM | POA: Diagnosis not present

## 2016-04-14 MED ORDER — HYDROCODONE-HOMATROPINE 5-1.5 MG/5ML PO SYRP: 5.0000 mL | ORAL_SOLUTION | Freq: Four times a day (QID) | ORAL | 0 refills | Status: DC | PRN

## 2016-04-14 MED ORDER — CEFDINIR 300 MG PO CAPS: 300.0000 mg | ORAL_CAPSULE | Freq: Two times a day (BID) | ORAL | 0 refills | Status: DC

## 2016-04-14 MED ORDER — ESCITALOPRAM OXALATE 10 MG PO TABS: 10.0000 mg | ORAL_TABLET | ORAL | 5 refills | Status: DC

## 2016-04-14 MED ORDER — METOPROLOL SUCCINATE ER 25 MG PO TB24: 25.0000 mg | ORAL_TABLET | Freq: Every day | ORAL | 3 refills | Status: DC

## 2016-04-14 NOTE — Progress Notes (Signed)
Subjective:    Patient ID: Anthony [name], male    DOB: Jan 26, 1955, 61 y.o.   MRN: [id number] Patient arrives office with numerous concerns Sinusitis  This is a new problem. Episode onset: 4 -5 days. Associated symptoms include congestion, coughing and ear pain. (Abd pain) Past treatments include nothing (ED on saturday).  Patient was seen in the emergency room with respiratory illness. Felt to have flu like illness but not given antibiotics. Still having substantial cough. Productive yellowish phlegm. Now left lower abdominal pain. Sharp in nature worse with deep breath.  Patient also reports worsening of mood over the past 6 months more irritable more depressed more feeling down. We did a lot of blood work looking for sources of fatigue and tiredness. It all came back normal see results below.  Patient has had also the development of atrial fibrillation. Now on eliquis. Cardiologist added metoprolol. Has a few questions about this  Results for orders placed or performed during the hospital encounter of 04/10/16  Comprehensive metabolic panel  Result Value Ref Range   Sodium 141 135 - 145 mmol/L   Potassium 3.8 3.5 - 5.1 mmol/L   Chloride 109 101 - 111 mmol/L   CO2 25 22 - 32 mmol/L   Glucose, Bld 133 (H) 65 - 99 mg/dL   BUN 11 6 - 20 mg/dL   Creatinine, Ser 1.19 0.61 - 1.24 mg/dL   Calcium 9.1 8.9 - 10.3 mg/dL   Total Protein 7.0 6.5 - 8.1 g/dL   Albumin 4.1 3.5 - 5.0 g/dL   AST 28 15 - 41 U/L   ALT 31 17 - 63 U/L   Alkaline Phosphatase 55 38 - 126 U/L   Total Bilirubin 0.6 0.3 - 1.2 mg/dL   GFR calc non Af Amer >60 >60 mL/min   GFR calc Af Amer >60 >60 mL/min   Anion gap 7 5 - 15  CBC with Differential  Result Value Ref Range   WBC 5.8 4.0 - 10.5 K/uL   RBC 4.96 4.22 - 5.81 MIL/uL   Hemoglobin 15.6 13.0 - 17.0 g/dL   HCT 43.8 39.0 - 52.0 %   MCV 88.3 78.0 - 100.0 fL   MCH 31.5 26.0 - 34.0 pg   MCHC 35.6 30.0 - 36.0 g/dL   RDW 11.9 11.5 - 15.5 %   Platelets 147 (L) 150  - 400 K/uL   Neutrophils Relative % 81 %   Neutro Abs 4.7 1.7 - 7.7 K/uL   Lymphocytes Relative 8 %   Lymphs Abs 0.5 (L) 0.7 - 4.0 K/uL   Monocytes Relative 9 %   Monocytes Absolute 0.6 0.1 - 1.0 K/uL   Eosinophils Relative 2 %   Eosinophils Absolute 0.1 0.0 - 0.7 K/uL   Basophils Relative 0 %   Basophils Absolute 0.0 0.0 - 0.1 K/uL  CK  Result Value Ref Range   Total CK 131 49 - 397 U/L    Recent Results (from the past 2160 hour(s))  Lipid panel     Status: Abnormal   Collection Time: 04/08/16  8:06 AM  Result Value Ref Range   Cholesterol, Total 226 (H) 100 - 199 mg/dL   Triglycerides 121 0 - 149 mg/dL   HDL 36 (L) >39 mg/dL   VLDL Cholesterol Cal 24 5 - 40 mg/dL   LDL Calculated 166 (H) 0 - 99 mg/dL   Chol/HDL Ratio 6.3 (H) 0.0 - 5.0 ratio units    Comment:  T. Chol/HDL Ratio                                             Men  Women                               1/2 Avg.Risk  3.4    3.3                                   Avg.Risk  5.0    4.4                                2X Avg.Risk  9.6    7.1                                3X Avg.Risk 23.4   11.0   Hepatic function panel     Status: None   Collection Time: 04/08/16  8:06 AM  Result Value Ref Range   Total Protein 7.0 6.0 - 8.5 g/dL   Albumin 4.6 3.6 - 4.8 g/dL   Bilirubin Total 0.8 0.0 - 1.2 mg/dL   Bilirubin, Direct 0.17 0.00 - 0.40 mg/dL   Alkaline Phosphatase 64 39 - 117 IU/L   AST 24 0 - 40 IU/L   ALT 28 0 - 44 IU/L  Basic metabolic panel     Status: Abnormal   Collection Time: 04/08/16  8:06 AM  Result Value Ref Range   Glucose 83 65 - 99 mg/dL   BUN 14 8 - 27 mg/dL   Creatinine, Ser 1.20 0.76 - 1.27 mg/dL   GFR calc non Af Amer 65 >59 mL/min/1.73   GFR calc Af Amer 76 >59 mL/min/1.73   BUN/Creatinine Ratio 12 10 - 24   Sodium 145 (H) 134 - 144 mmol/L   Potassium 4.0 3.5 - 5.2 mmol/L   Chloride 104 96 - 106 mmol/L   CO2 25 18 - 29 mmol/L   Calcium 9.6 8.6 - 10.2 mg/dL    CBC with Differential/Platelet     Status: Abnormal   Collection Time: 04/08/16  8:06 AM  Result Value Ref Range   WBC 6.1 3.4 - 10.8 x10E3/uL   RBC 5.04 4.14 - 5.80 x10E6/uL   Hemoglobin 15.7 13.0 - 17.7 g/dL   Hematocrit 44.8 37.5 - 51.0 %   MCV 89 79 - 97 fL   MCH 31.2 26.6 - 33.0 pg   MCHC 35.0 31.5 - 35.7 g/dL   RDW 13.6 12.3 - 15.4 %   Platelets 181 150 - 379 x10E3/uL   Neutrophils 45 Not Estab. %   Lymphs 37 Not Estab. %   Monocytes 10 Not Estab. %   Eos 7 Not Estab. %   Basos 1 Not Estab. %   Neutrophils Absolute 2.7 1.4 - 7.0 x10E3/uL   Lymphocytes Absolute 2.2 0.7 - 3.1 x10E3/uL   Monocytes Absolute 0.6 0.1 - 0.9 x10E3/uL   EOS (ABSOLUTE) 0.5 (H) 0.0 - 0.4 x10E3/uL   Basophils Absolute 0.1 0.0 - 0.2 x10E3/uL   Immature Granulocytes 0 Not Estab. %   Immature Grans (Abs) 0.0 0.0 - 0.1  x10E3/uL  Testosterone     Status: None   Collection Time: 04/08/16  8:06 AM  Result Value Ref Range   Testosterone 456 264 - 916 ng/dL    Comment: Adult male reference interval is based on a population of healthy nonobese males (BMI <30) between 64 and 73 years old. [place], [place] [phone]. PMID: [id number].   TSH     Status: None   Collection Time: 04/08/16  8:06 AM  Result Value Ref Range   TSH 2.720 0.450 - 4.500 uIU/mL  PSA     Status: None   Collection Time: 04/08/16  8:06 AM  Result Value Ref Range   Prostate Specific Ag, Serum 0.8 0.0 - 4.0 ng/mL    Comment: Roche ECLIA methodology. According to the American Urological Association, Serum PSA should decrease and remain at undetectable levels after radical prostatectomy. The AUA defines biochemical recurrence as an initial PSA value 0.2 ng/mL or greater followed by a subsequent confirmatory PSA value 0.2 ng/mL or greater. Values obtained with different assay methods or kits cannot be used interchangeably. Results cannot be interpreted as absolute evidence of the presence or absence of malignant disease.    Comprehensive metabolic panel     Status: Abnormal   Collection Time: 04/10/16  6:22 AM  Result Value Ref Range   Sodium 141 135 - 145 mmol/L   Potassium 3.8 3.5 - 5.1 mmol/L   Chloride 109 101 - 111 mmol/L   CO2 25 22 - 32 mmol/L   Glucose, Bld 133 (H) 65 - 99 mg/dL   BUN 11 6 - 20 mg/dL   Creatinine, Ser 1.19 0.61 - 1.24 mg/dL   Calcium 9.1 8.9 - 10.3 mg/dL   Total Protein 7.0 6.5 - 8.1 g/dL   Albumin 4.1 3.5 - 5.0 g/dL   AST 28 15 - 41 U/L   ALT 31 17 - 63 U/L   Alkaline Phosphatase 55 38 - 126 U/L   Total Bilirubin 0.6 0.3 - 1.2 mg/dL   GFR calc non Af Amer >60 >60 mL/min   GFR calc Af Amer >60 >60 mL/min    Comment: (NOTE) The eGFR has been calculated using the CKD EPI equation. This calculation has not been validated in all clinical situations. eGFR's persistently <60 mL/min signify possible Chronic Kidney Disease.    Anion gap 7 5 - 15  CBC with Differential     Status: Abnormal   Collection Time: 04/10/16  6:22 AM  Result Value Ref Range   WBC 5.8 4.0 - 10.5 K/uL   RBC 4.96 4.22 - 5.81 MIL/uL   Hemoglobin 15.6 13.0 - 17.0 g/dL   HCT 43.8 39.0 - 52.0 %   MCV 88.3 78.0 - 100.0 fL   MCH 31.5 26.0 - 34.0 pg   MCHC 35.6 30.0 - 36.0 g/dL   RDW 11.9 11.5 - 15.5 %   Platelets 147 (L) 150 - 400 K/uL   Neutrophils Relative % 81 %   Neutro Abs 4.7 1.7 - 7.7 K/uL   Lymphocytes Relative 8 %   Lymphs Abs 0.5 (L) 0.7 - 4.0 K/uL   Monocytes Relative 9 %   Monocytes Absolute 0.6 0.1 - 1.0 K/uL   Eosinophils Relative 2 %   Eosinophils Absolute 0.1 0.0 - 0.7 K/uL   Basophils Relative 0 %   Basophils Absolute 0.0 0.0 - 0.1 K/uL  CK     Status: None   Collection Time: 04/10/16  6:22 AM  Result Value Ref Range  Total CK 131 49 - 397 U/L   Review of Systems  HENT: Positive for congestion and ear pain.   Respiratory: Positive for cough.    No vomiting no diarrhea no change in bowel habits no rash no fever no chills    Objective:   Physical Exam Alert and oriented,  vitals reviewed and stable, NAD ENT-TM's and ext canals WNL bilat via otoscopic exam Soft palate, tonsils and post pharynx WNL via oropharyngeal exam Neck-symmetric, no masses; thyroid nonpalpable and nontender Pulmonary-no tachypnea or accessory muscle use; Clear without wheezes via auscultation Card--no abnrml murmurs, rhythm reg and rate WNL Carotid pulses symmetric, without bruits        Assessment & Plan:  Impression #1 depression/irritability discussed at length. Options discussed. Patient like to try medication side effects benefits discussed #2 atrial fibrillation numerous questions answered about this #3 post viral bronchitis #4 abdominal strain plan when necessary symptom care discussed. Multiple questions answered. Antibiotics prescribed. Lexapro initiated. Follow-up as scheduled. WSL  Greater than 50% of this 25 minute face to face visit was spent in counseling and discussion and coordination of care regarding the above diagnosis/diagnosies

## 2016-04-14 NOTE — Patient Instructions (Signed)
Your physician has recommended you make the following change in your medication:  1)Start Metoprolol (Toprol) 25mg  once a day

## 2016-04-14 NOTE — Progress Notes (Addendum)
Primary Care Physician: Anthony Hillier, MD Referring Physician:   [name] is a 61 y.o. male with a h/o irregular heart beat intermittently for " years" presented to the [facility] ER 3/24 for influenza type illness and was found to be in afib with rvr. He was started on eliquis 5 mg bid and after IV fluids his heart rate decreased from 140 bpm to 100 bpm. He is in the afib clinic today for f/u.  He reports that he does not smoke, drink alcohol, moderate amounts of caffeine.By his wife reports , he does snore"terribly" and has witnessed apnea spells. He states that he has been tired for years and sleeps poorly. He has daytime somnolence. EKG shows afib at 124 bpm. He has been on verapamil for many years for extra heart beats.  Today, he denies symptoms of palpitations, chest pain, shortness of breath, orthopnea, PND, lower extremity edema, dizziness, presyncope, syncope, or neurologic sequela. The patient is tolerating medications without difficulties and is otherwise without complaint today.   Past Medical History:  Diagnosis Date  . Anxiety   . Back pain   . Hyperlipidemia   . Hypertension   . Insomnia   . Migraine headache   . Reflux   . Sinusitis   . Venous stasis    Past Surgical History:  Procedure Laterality Date  . ABDOMINAL SURGERY    . ANKLE SURGERY      Current Outpatient Prescriptions  Medication Sig Dispense Refill  . ALPRAZolam (XANAX) 0.5 MG tablet Take 1 tablet (0.5 mg total) by mouth daily as needed for anxiety. 30 tablet 5  . apixaban (ELIQUIS) 5 MG TABS tablet Take 1 tablet (5 mg total) by mouth 2 (two) times daily. 60 tablet 0  . Ascorbic Acid (VITAMIN C) 1000 MG tablet Take 1,000 mg by mouth daily.    . Echinacea 125 MG CAPS Take 125 mg by mouth daily.    Marland Kitchen ezetimibe (ZETIA) 10 MG tablet Take 1 tablet (10 mg total) by mouth daily. 90 tablet 1  . lisinopril (PRINIVIL,ZESTRIL) 20 MG tablet Take 1 tablet (20 mg total) by mouth 2 (two) times daily. 60 tablet 5    . loratadine (CLARITIN) 10 MG tablet Take 10 mg by mouth daily.    . Multiple Vitamin (MULTIVITAMIN) tablet Take 1 tablet by mouth daily.    . SUMAtriptan (IMITREX) 50 MG tablet Take 1 tablet at first sign of headache, may take 2 tablets 2 hours later if needed 20 tablet 5  . verapamil (VERELAN PM) 240 MG 24 hr capsule Take 1 capsule (240 mg total) by mouth daily. 30 capsule 5  . vitamin E 400 UNIT capsule Take 400 Units by mouth daily.    Marland Kitchen zolpidem (AMBIEN) 10 MG tablet Take 1 tablet (10 mg total) by mouth at bedtime. 30 tablet 5  . HYDROcodone-homatropine (HYCODAN) 5-1.5 MG/5ML syrup Take 5 mLs by mouth at bedtime as needed for cough. 90 mL 0  . metoprolol succinate (TOPROL XL) 25 MG 24 hr tablet Take 1 tablet (25 mg total) by mouth daily. 30 tablet 3   No current facility-administered medications for this encounter.     Allergies  Allergen Reactions  . Shellfish Allergy Anaphylaxis and Nausea And Vomiting  . Apple Swelling  . Asa [Aspirin] Swelling    Facial Area  . Codeine Other (See Comments)    Nightmares  . Contrast Media [Iodinated Diagnostic Agents] Hives    Needs pre meds  . Isovue [Iopamidol] Hives  Needs pre meds  . Lipitor [Atorvastatin] Other (See Comments)    Aches  . Nsaids Other (See Comments)    Unknown   . Zocor [Simvastatin] Other (See Comments)    Aches    Social History   Social History  . Marital status: Married    Spouse name: N/A  . Number of children: N/A  . Years of education: N/A   Occupational History  . Not on file.   Social History Main Topics  . Smoking status: Never Smoker  . Smokeless tobacco: Never Used  . Alcohol use No  . Drug use: No  . Sexual activity: Not on file   Other Topics Concern  . Not on file   Social History Narrative  . No narrative on file    No family history on file.  ROS- All systems are reviewed and negative except as per the HPI above  Physical Exam: Vitals:   04/14/16 0920  BP: (!) 160/90   Pulse: (!) 124  Weight: 203 lb 12.8 oz (92.4 kg)  Height: 5\' 11"  (1.803 m)   Wt Readings from Last 3 Encounters:  04/14/16 203 lb 12.8 oz (92.4 kg)  04/10/16 202 lb (91.6 kg)  04/05/16 209 lb (94.8 kg)    Labs: Lab Results  Component Value Date   NA 141 04/10/2016   K 3.8 04/10/2016   CL 109 04/10/2016   CO2 25 04/10/2016   GLUCOSE 133 (H) 04/10/2016   BUN 11 04/10/2016   CREATININE 1.19 04/10/2016   CALCIUM 9.1 04/10/2016   No results found for: INR Lab Results  Component Value Date   CHOL 226 (H) 04/08/2016   HDL 36 (L) 04/08/2016   LDLCALC 166 (H) 04/08/2016   TRIG 121 04/08/2016     GEN- The patient is well appearing, alert and oriented x 3 today.   Head- normocephalic, atraumatic Eyes-  Sclera clear, conjunctiva pink Ears- hearing intact Oropharynx- clear Neck- supple, no JVP Lymph- no cervical lymphadenopathy Lungs- Clear to ausculation bilaterally, normal work of breathing Heart-irregular rate and rhythm, no murmurs, rubs or gallops, PMI not laterally displaced GI- soft, NT, ND, + BS Extremities- no clubbing, cyanosis, or edema MS- no significant deformity or atrophy Skin- no rash or lesion Psych- euthymic mood, full affect Neuro- strength and sensation are intact  EKG-afib with rvr at 124 bpm, IRBBB, LAD, qrs itn 106 ms, qtc 456 ms Epic records reviewed    Assessment and Plan: 1. Newly diagnosed afib General afib education  Although pt feels he has had intermittently for years Continue eliquis 5 mg bid  Bleeding precautions discussed Continue verapamil  Add metoprolol ER 25 mg a day Echo  2. Lifestyle issues possibly contributing to afib Sleep study for snoring and witnessed apnea, daytime somnolence Decrease amount of Mt Dew that he is drinking  Encouraged regular exercise when rate control/SR has been obtained  f/u Monday for rate control efforts and when controlled , will get echo   Anthony [name], [facility] [address] [place], [place] [address] [phone]

## 2016-04-19 ENCOUNTER — Ambulatory Visit (HOSPITAL_COMMUNITY)
Admission: RE | Admit: 2016-04-19 | Discharge: 2016-04-19 | Disposition: A | Discharge status: Home / Self Care | Payer: 59 | Source: Ambulatory Visit | Attending: Nurse Practitioner | Admitting: Nurse Practitioner

## 2016-04-19 ENCOUNTER — Telehealth: Payer: Self-pay | Admitting: *Deleted

## 2016-04-19 ENCOUNTER — Encounter (HOSPITAL_COMMUNITY): Payer: Self-pay | Admitting: Nurse Practitioner

## 2016-04-19 ENCOUNTER — Ambulatory Visit: Payer: 59 | Admitting: Family Medicine

## 2016-04-19 VITALS — BP 152/106 | HR 91

## 2016-04-19 DIAGNOSIS — I480 Paroxysmal atrial fibrillation: Secondary | ICD-10-CM | POA: Diagnosis not present

## 2016-04-19 DIAGNOSIS — I4891 Unspecified atrial fibrillation: Secondary | ICD-10-CM

## 2016-04-19 DIAGNOSIS — I4519 Other right bundle-branch block: Secondary | ICD-10-CM | POA: Diagnosis not present

## 2016-04-19 DIAGNOSIS — I517 Cardiomegaly: Secondary | ICD-10-CM | POA: Insufficient documentation

## 2016-04-19 MED ORDER — METOPROLOL SUCCINATE ER 25 MG PO TB24: 25.0000 mg | ORAL_TABLET | Freq: Two times a day (BID) | ORAL | 2 refills | Status: DC

## 2016-04-19 NOTE — Progress Notes (Addendum)
Increase Metoprolol to 25 mg twice a day.   Follow up with afib clinic in 1 week  Your physician has requested that you have an echocardiogram. Echocardiography is a painless test that uses sound waves to create images of your heart. It provides your doctor with information about the size and shape of your heart and how well your heart's chambers and valves are working. This procedure takes approximately one hour. There are no restrictions for this procedure.  This has been scheduled for  Monday 04/26/2016 at 3 pm.   Follow up with Roderic Palau, NP 04/27/2016  Pt is feeling slightly better with HR now better rate controlled at 91 bpm, but still with fatigue and poorly controlled blood pressure. Increase BB as above and scheduled echo and see back in one week.

## 2016-04-19 NOTE — Telephone Encounter (Signed)
Patient notified of sleep study left a message on cell vm

## 2016-04-19 NOTE — Telephone Encounter (Signed)
-----   Message from Juluis Mire, RN sent at 04/14/2016  4:04 PM EDT ----- Regarding: sleep study Pt needs sleep study for afib. Pt will be expecting call. Thanks Nurse, adult :)

## 2016-04-20 ENCOUNTER — Encounter: Payer: Self-pay | Admitting: Family Medicine

## 2016-04-20 ENCOUNTER — Telehealth: Payer: Self-pay | Admitting: Family Medicine

## 2016-04-20 NOTE — Telephone Encounter (Signed)
Patient is still dealing with issues with blood pressure and heart rate.  He is seeing the cardiologist currently.  He wants to know if Dr. Richardson Landry can write him out of work for a couple of weeks until he can get this all figured out.

## 2016-04-20 NOTE — Telephone Encounter (Signed)
Spoke with patient and informed her per Dr.Steve Luking- We can give a work excuse for the next two weeks. If your insurance/workplace needs any forms filled out may need an office visit to fill them out. Patient verbalized understanding.

## 2016-04-20 NOTE — Telephone Encounter (Signed)
Left message return call 04/20/2016

## 2016-04-20 NOTE — Telephone Encounter (Signed)
Note completed and up front to pick up.

## 2016-04-20 NOTE — Telephone Encounter (Signed)
Ok., w e two weeks. Let pt know if his insur/workplace gets grumpy about [place] doing this may ntbs to fill out their forms

## 2016-04-21 ENCOUNTER — Ambulatory Visit (INDEPENDENT_AMBULATORY_CARE_PROVIDER_SITE_OTHER): Payer: 59 | Admitting: Nurse Practitioner

## 2016-04-21 ENCOUNTER — Encounter: Payer: Self-pay | Admitting: Nurse Practitioner

## 2016-04-21 ENCOUNTER — Encounter: Payer: Self-pay | Admitting: Family Medicine

## 2016-04-21 VITALS — BP 148/96 | Temp 98.3°F | Ht 71.0 in | Wt 199.0 lb

## 2016-04-21 DIAGNOSIS — J300 Vasomotor rhinitis: Secondary | ICD-10-CM | POA: Diagnosis not present

## 2016-04-21 DIAGNOSIS — Z79899 Other long term (current) drug therapy: Secondary | ICD-10-CM | POA: Diagnosis not present

## 2016-04-21 DIAGNOSIS — R233 Spontaneous ecchymoses: Secondary | ICD-10-CM | POA: Diagnosis not present

## 2016-04-21 NOTE — Patient Instructions (Signed)
Continue Claritin Add Rhinocort, Nasacort or Flonase to regimen daily

## 2016-04-22 ENCOUNTER — Encounter: Payer: Self-pay | Admitting: Nurse Practitioner

## 2016-04-22 LAB — CBC WITH DIFFERENTIAL/PLATELET
Basophils Absolute: 0 10*3/uL (ref 0.0–0.2)
Basos: 0 %
EOS (ABSOLUTE): 0.4 10*3/uL (ref 0.0–0.4)
Eos: 4 %
HEMATOCRIT: 44.4 % (ref 37.5–51.0)
Hemoglobin: 15 g/dL (ref 13.0–17.7)
IMMATURE GRANS (ABS): 0 10*3/uL (ref 0.0–0.1)
IMMATURE GRANULOCYTES: 0 %
LYMPHS: 29 %
Lymphocytes Absolute: 2.6 10*3/uL (ref 0.7–3.1)
MCH: 30.2 pg (ref 26.6–33.0)
MCHC: 33.8 g/dL (ref 31.5–35.7)
MCV: 90 fL (ref 79–97)
MONOCYTES: 10 %
Monocytes Absolute: 0.9 10*3/uL (ref 0.1–0.9)
NEUTROS ABS: 5 10*3/uL (ref 1.4–7.0)
NEUTROS PCT: 57 %
PLATELETS: 319 10*3/uL (ref 150–379)
RBC: 4.96 x10E6/uL (ref 4.14–5.80)
RDW: 13.3 % (ref 12.3–15.4)
WBC: 8.9 10*3/uL (ref 3.4–10.8)

## 2016-04-22 NOTE — Progress Notes (Signed)
Subjective:  Presents for recheck of his cough see previous notes. Was prescribed Omnicef and hydrocodone on 3/24. Frequent persistent cough, mostly nonproductive. Runny nose. No headache or sore throat. Slight ear pain. No wheezing. No abdominal pain acid reflux or vomiting. No fevers. Started Eliquis on 3/24 for atrial fib. Is being followed by cardiology. Last visit 4/2. Has an appointment with cardiac echo with further intervention after this. Began having some bruising along the abdominal area after starting the Eliquis. Showed this to the cardiology NP on 4/2. No abdominal pain. Area slightly tender. Was told that this most likely due to extreme coughing. No blood in his stools. No hematuria. No nosebleeds or bleeding from his gums.  Objective:   BP (!) 148/96   Temp 98.3 F (36.8 C) (Oral)   Ht 5\' 11"  (1.803 m)   Wt 199 lb (90.3 kg)   BMI 27.75 kg/m  NAD. Alert, oriented. TMs retracted, no erythema. Pharynx mildly injected with cloudy PND noted. Neck supple with mild soft anterior adenopathy. Lungs clear. Heart regular rate rhythm with rate of 88. Abdomen soft nondistended with 3 areas of dark ecchymoses nonraised in different stages of healing in the left lower abdomen, smaller area mid lower abdomen and pubic area. Minimally tender to palpation. No mass noted. Chest x-ray dated 3/24 was negative for edema or consolidation.  Assessment:  Acute vasomotor rhinitis  Petechiae or ecchymoses - Plan: CBC with Differential/Platelet, CANCELED: CBC with Differential/Platelet  High risk medication use    Plan:  CBC pending. Continue plain OTC antihistamine as directed. Add steroid nasal spray. Reviewed warning signs regarding bleeding. Patient to call or go to ED if any problems. Expect gradual resolution of bruising. Callback in 7-10 days if no improvement in cough or bruising, sooner if worse. Expect gradual resolution.

## 2016-04-26 ENCOUNTER — Ambulatory Visit (HOSPITAL_COMMUNITY)
Admission: RE | Admit: 2016-04-26 | Discharge: 2016-04-26 | Disposition: A | Discharge status: Home / Self Care | Payer: 59 | Source: Ambulatory Visit | Attending: Nurse Practitioner | Admitting: Nurse Practitioner

## 2016-04-26 DIAGNOSIS — I480 Paroxysmal atrial fibrillation: Secondary | ICD-10-CM | POA: Insufficient documentation

## 2016-04-26 LAB — ECHOCARDIOGRAM COMPLETE
FS: 24 % — AB (ref 28–44)
IV/PV OW: 0.79
LA diam index: 2.15 cm/m2
LA vol A4C: 60.7 ml
LA vol index: 29.5 mL/m2
LASIZE: 46 mm
LAVOL: 63.2 mL
LEFT ATRIUM END SYS DIAM: 46 mm
LV PW d: 9.94 mm — AB (ref 0.6–1.1)
LVOT area: 3.8 cm2
LVOT diameter: 22 mm
RV sys press: 30 mmHg
Reg peak vel: 250 cm/s
TR max vel: 250 cm/s

## 2016-04-26 NOTE — Progress Notes (Signed)
  Echocardiogram 2D Echocardiogram has been performed.  Johny Chess 04/26/2016, 4:09 PM

## 2016-04-27 ENCOUNTER — Telehealth: Payer: Self-pay | Admitting: Family Medicine

## 2016-04-27 ENCOUNTER — Ambulatory Visit (HOSPITAL_COMMUNITY)
Admission: RE | Admit: 2016-04-27 | Discharge: 2016-04-27 | Disposition: A | Discharge status: Home / Self Care | Payer: 59 | Source: Ambulatory Visit | Attending: Nurse Practitioner | Admitting: Nurse Practitioner

## 2016-04-27 ENCOUNTER — Encounter (HOSPITAL_COMMUNITY): Payer: Self-pay | Admitting: Nurse Practitioner

## 2016-04-27 VITALS — BP 142/80 | Ht 71.0 in | Wt 203.8 lb

## 2016-04-27 DIAGNOSIS — F419 Anxiety disorder, unspecified: Secondary | ICD-10-CM | POA: Diagnosis not present

## 2016-04-27 DIAGNOSIS — Z888 Allergy status to other drugs, medicaments and biological substances status: Secondary | ICD-10-CM | POA: Insufficient documentation

## 2016-04-27 DIAGNOSIS — E785 Hyperlipidemia, unspecified: Secondary | ICD-10-CM | POA: Diagnosis not present

## 2016-04-27 DIAGNOSIS — I4891 Unspecified atrial fibrillation: Secondary | ICD-10-CM | POA: Insufficient documentation

## 2016-04-27 DIAGNOSIS — Z91013 Allergy to seafood: Secondary | ICD-10-CM | POA: Insufficient documentation

## 2016-04-27 DIAGNOSIS — I481 Persistent atrial fibrillation: Secondary | ICD-10-CM

## 2016-04-27 DIAGNOSIS — Z886 Allergy status to analgesic agent status: Secondary | ICD-10-CM | POA: Insufficient documentation

## 2016-04-27 DIAGNOSIS — I517 Cardiomegaly: Secondary | ICD-10-CM | POA: Insufficient documentation

## 2016-04-27 DIAGNOSIS — I4819 Other persistent atrial fibrillation: Secondary | ICD-10-CM

## 2016-04-27 DIAGNOSIS — K219 Gastro-esophageal reflux disease without esophagitis: Secondary | ICD-10-CM | POA: Insufficient documentation

## 2016-04-27 DIAGNOSIS — I10 Essential (primary) hypertension: Secondary | ICD-10-CM | POA: Diagnosis not present

## 2016-04-27 DIAGNOSIS — Z9889 Other specified postprocedural states: Secondary | ICD-10-CM | POA: Diagnosis not present

## 2016-04-27 DIAGNOSIS — Z79899 Other long term (current) drug therapy: Secondary | ICD-10-CM | POA: Insufficient documentation

## 2016-04-27 LAB — BASIC METABOLIC PANEL WITH GFR
Anion gap: 6 (ref 5–15)
BUN: 8 mg/dL (ref 6–20)
CO2: 27 mmol/L (ref 22–32)
Calcium: 8.9 mg/dL (ref 8.9–10.3)
Chloride: 109 mmol/L (ref 101–111)
Creatinine, Ser: 1.06 mg/dL (ref 0.61–1.24)
GFR calc Af Amer: >60 mL/min
GFR calc non Af Amer: >60 mL/min
Glucose, Bld: 88 mg/dL (ref 65–99)
Potassium: 4.6 mmol/L (ref 3.5–5.1)
Sodium: 142 mmol/L (ref 135–145)

## 2016-04-27 LAB — CBC
HCT: 39.2 % (ref 39.0–52.0)
Hemoglobin: 13.7 g/dL (ref 13.0–17.0)
MCH: 31.1 pg (ref 26.0–34.0)
MCHC: 34.9 g/dL (ref 30.0–36.0)
MCV: 88.9 fL (ref 78.0–100.0)
Platelets: 277 K/uL (ref 150–400)
RBC: 4.41 MIL/uL (ref 4.22–5.81)
RDW: 12.2 % (ref 11.5–15.5)
WBC: 8.9 K/uL (ref 4.0–10.5)

## 2016-04-27 MED ORDER — METOPROLOL SUCCINATE ER 25 MG PO TB24: 25.0000 mg | ORAL_TABLET | Freq: Two times a day (BID) | ORAL | 2 refills | Status: DC

## 2016-04-27 NOTE — Telephone Encounter (Signed)
(  Message for Hoyle Sauer) short term disability form in your box that needs to be filled out by physician only.Please date/sign.

## 2016-04-27 NOTE — Patient Instructions (Signed)
Cardioversion scheduled for Thursday, April 19th  - Arrive at the [facility] and go to admitting at 10:30AM  -Do not eat or drink anything after midnight the night prior to your procedure.  - Take all your medication with a sip of water prior to arrival EXCEPT your metoprolol  - You will not be able to drive home after your procedure.   Call with update of heart rate after cardioversion - to determine metoprolol dosing once back in rhythm.

## 2016-04-27 NOTE — Progress Notes (Signed)
  Primary Care Physician: Steve Luking, MD Referring Physician:[facility] ER f/u   Anthony [name] is a 60 y.o. male with a h/o irregular heart beat intermittently for " years" presented to the [facility] ER 3/24 for influenza type illness and was found to be in afib with rvr. He was started on eliquis 5 mg bid, first full day of drug 3/25, and after IV fluids his heart rate decreased from 140 bpm to 100 bpm. He is in the afib clinic today for f/u.  He reports that he does not smoke, drink alcohol, moderate amounts of caffeine.By his wife reports , he does snore"terribly" and has witnessed apnea spells. He states that he has been tired for years and sleeps poorly. He has daytime somnolence. EKG shows afib at 124 bpm. He has been on verapamil for many years for extra heart beats.  F/u in afib clinic. He continues in afib with v rate at 76 bpm. He feels slightly better but still with fatigue. Sleep study is still pending. He will have met requirements for 3 weeks uninterrupted anticoagulation and will be eligible for cardioversion next week. Echo results reviewed.  Today, he denies symptoms of palpitations, chest pain, shortness of breath, orthopnea, PND, lower extremity edema, dizziness, presyncope, syncope, or neurologic sequela. Positive for fatigue. The patient is tolerating medications without difficulties and is otherwise without complaint today.   Past Medical History:  Diagnosis Date  . Anxiety   . Back pain   . Hyperlipidemia   . Hypertension   . Insomnia   . Migraine headache   . Reflux   . Sinusitis   . Venous stasis    Past Surgical History:  Procedure Laterality Date  . ABDOMINAL SURGERY    . ANKLE SURGERY      Current Outpatient Prescriptions  Medication Sig Dispense Refill  . ALPRAZolam (XANAX) 0.5 MG tablet Take 1 tablet (0.5 mg total) by mouth daily as needed for anxiety. 30 tablet 5  . apixaban (ELIQUIS) 5 MG TABS tablet Take 1 tablet (5 mg total) by mouth 2 (two) times daily.  60 tablet 0  . Ascorbic Acid (VITAMIN C) 1000 MG tablet Take 1,000 mg by mouth daily.    . escitalopram (LEXAPRO) 10 MG tablet Take 1 tablet (10 mg total) by mouth every morning. 30 tablet 5  . ezetimibe (ZETIA) 10 MG tablet Take 1 tablet (10 mg total) by mouth daily. 90 tablet 1  . lisinopril (PRINIVIL,ZESTRIL) 20 MG tablet Take 1 tablet (20 mg total) by mouth 2 (two) times daily. 60 tablet 5  . loratadine (CLARITIN) 10 MG tablet Take 10 mg by mouth daily.    . metoprolol succinate (TOPROL XL) 25 MG 24 hr tablet Take 1 tablet (25 mg total) by mouth 2 (two) times daily. 60 tablet 2  . Multiple Vitamin (MULTIVITAMIN) tablet Take 1 tablet by mouth daily.    . SUMAtriptan (IMITREX) 50 MG tablet Take 1 tablet at first sign of headache, may take 2 tablets 2 hours later if needed 20 tablet 5  . verapamil (VERELAN PM) 240 MG 24 hr capsule Take 1 capsule (240 mg total) by mouth daily. 30 capsule 5  . vitamin E 400 UNIT capsule Take 400 Units by mouth daily.    . zolpidem (AMBIEN) 10 MG tablet Take 1 tablet (10 mg total) by mouth at bedtime. 30 tablet 5   No current facility-administered medications for this encounter.     Allergies  Allergen Reactions  . Shellfish Allergy   Anaphylaxis and Nausea And Vomiting  . Acyclovir And Related   . Apple Swelling  . Asa [Aspirin] Swelling    Facial Area  . Codeine Other (See Comments)    Nightmares  . Contrast Media [Iodinated Diagnostic Agents] Hives    Needs pre meds  . Isovue [Iopamidol] Hives    Needs pre meds  . Lipitor [Atorvastatin] Other (See Comments)    Aches  . Nsaids Other (See Comments)    Unknown   . Zocor [Simvastatin] Other (See Comments)    Aches    Social History   Social History  . Marital status: Married    Spouse name: N/A  . Number of children: N/A  . Years of education: N/A   Occupational History  . Not on file.   Social History Main Topics  . Smoking status: Never Smoker  . Smokeless tobacco: Never Used  .  Alcohol use No  . Drug use: No  . Sexual activity: Not on file   Other Topics Concern  . Not on file   Social History Narrative  . No narrative on file    No family history on file.  ROS- All systems are reviewed and negative except as per the HPI above  Physical Exam: Vitals:   04/27/16 0833  BP: (!) 142/80  Weight: 203 lb 12.8 oz (92.4 kg)  Height: 5' 11" (1.803 m)   Wt Readings from Last 3 Encounters:  04/27/16 203 lb 12.8 oz (92.4 kg)  04/21/16 199 lb (90.3 kg)  04/14/16 203 lb 12.8 oz (92.4 kg)    Labs: Lab Results  Component Value Date   NA 141 04/10/2016   K 3.8 04/10/2016   CL 109 04/10/2016   CO2 25 04/10/2016   GLUCOSE 133 (H) 04/10/2016   BUN 11 04/10/2016   CREATININE 1.19 04/10/2016   CALCIUM 9.1 04/10/2016   No results found for: INR Lab Results  Component Value Date   CHOL 226 (H) 04/08/2016   HDL 36 (L) 04/08/2016   LDLCALC 166 (H) 04/08/2016   TRIG 121 04/08/2016     GEN- The patient is well appearing, alert and oriented x 3 today.   Head- normocephalic, atraumatic Eyes-  Sclera clear, conjunctiva pink Ears- hearing intact Oropharynx- clear Neck- supple, no JVP Lymph- no cervical lymphadenopathy Lungs- Clear to ausculation bilaterally, normal work of breathing Heart-irregular rate and rhythm, no murmurs, rubs or gallops, PMI not laterally displaced GI- soft, NT, ND, + BS Extremities- no clubbing, cyanosis, or edema MS- no significant deformity or atrophy Skin- no rash or lesion Psych- euthymic mood, full affect Neuro- strength and sensation are intact  EKG-afib at 76 bpm, IRBBB, qrs int 92 ms, qtc 432 ms Epic records reviewed Echo-Study Conclusions  - Left ventricle: The cavity size was normal. Wall thickness was   normal. Systolic function was normal. The estimated ejection   fraction was in the range of 55% to 60%. Wall motion was normal;   there were no regional wall motion abnormalities. - Left atrium: The atrium was  moderately dilated. 46 mm     Assessment and Plan: 1. Newly diagnosed afib Continue eliquis 5 mg bid, will have had 3 weeks uninterrupted use on 4/15, will schedule cardioversion on 4/19 Continue verapamil  Risk vrs benefit of cardioversion discussed Continue metoprolol ER 25 mg bid but do not take am of cardioversion until can see heart rate in SR, will then determine dose going forward   2. Lifestyle issues possibly contributing to   afib Sleep study scheduled for snoring and witnessed apnea, daytime somnolence   Limit caffeine Encouraged regular exercise when rate control/SR has been obtained  F/u one week post cardioversion   Ellorie Kindall C. Tasean Mancha, ANP-C [facility] [address] [place], [place] [address] [phone]   

## 2016-05-03 NOTE — Telephone Encounter (Signed)
Please forward form to his cardiologists. He is under their care for his condition and will be undergoing cardioversion this week. It will be up to them about when he can to return to work and if any restrictions. Thanks.

## 2016-05-04 ENCOUNTER — Encounter: Payer: Self-pay | Admitting: Family Medicine

## 2016-05-04 ENCOUNTER — Telehealth: Payer: Self-pay | Admitting: Family Medicine

## 2016-05-04 DIAGNOSIS — Z029 Encounter for administrative examinations, unspecified: Secondary | ICD-10-CM

## 2016-05-04 NOTE — Telephone Encounter (Signed)
ok 

## 2016-05-04 NOTE — Telephone Encounter (Signed)
Faxed note and left copy up front for patient to pick up.

## 2016-05-04 NOTE — Telephone Encounter (Signed)
Patient is requesting for Dr. Richardson Landry to extend his work excuse to last until 05/11/16 when he sees his cardiologist.

## 2016-05-04 NOTE — Telephone Encounter (Signed)
Spoke with patient and informed him per Dr.Steve Luking- May have a work excuse extended until 05/15/16. Patient verbalized understanding and asked if we could fax the note to [phone]. Patient would also like to pick up a copy of the work excuse.

## 2016-05-06 ENCOUNTER — Ambulatory Visit (HOSPITAL_COMMUNITY): Payer: 59 | Admitting: Anesthesiology

## 2016-05-06 ENCOUNTER — Encounter (HOSPITAL_COMMUNITY): Payer: Self-pay | Admitting: Anesthesiology

## 2016-05-06 ENCOUNTER — Encounter (HOSPITAL_COMMUNITY)
Admission: RE | Disposition: A | Discharge status: Home / Self Care | Payer: Self-pay | Source: Ambulatory Visit | Attending: Cardiology

## 2016-05-06 ENCOUNTER — Other Ambulatory Visit (HOSPITAL_COMMUNITY): Payer: Self-pay | Admitting: *Deleted

## 2016-05-06 ENCOUNTER — Ambulatory Visit (HOSPITAL_COMMUNITY)
Admission: RE | Admit: 2016-05-06 | Discharge: 2016-05-06 | Disposition: A | Discharge status: Home / Self Care | Payer: 59 | Source: Ambulatory Visit | Attending: Cardiology | Admitting: Cardiology

## 2016-05-06 DIAGNOSIS — G4733 Obstructive sleep apnea (adult) (pediatric): Secondary | ICD-10-CM | POA: Diagnosis not present

## 2016-05-06 DIAGNOSIS — G4730 Sleep apnea, unspecified: Secondary | ICD-10-CM | POA: Insufficient documentation

## 2016-05-06 DIAGNOSIS — I10 Essential (primary) hypertension: Secondary | ICD-10-CM | POA: Insufficient documentation

## 2016-05-06 DIAGNOSIS — F329 Major depressive disorder, single episode, unspecified: Secondary | ICD-10-CM | POA: Diagnosis not present

## 2016-05-06 DIAGNOSIS — I4819 Other persistent atrial fibrillation: Secondary | ICD-10-CM

## 2016-05-06 DIAGNOSIS — Z91013 Allergy to seafood: Secondary | ICD-10-CM | POA: Insufficient documentation

## 2016-05-06 DIAGNOSIS — K219 Gastro-esophageal reflux disease without esophagitis: Secondary | ICD-10-CM | POA: Diagnosis not present

## 2016-05-06 DIAGNOSIS — G43909 Migraine, unspecified, not intractable, without status migrainosus: Secondary | ICD-10-CM | POA: Insufficient documentation

## 2016-05-06 DIAGNOSIS — I878 Other specified disorders of veins: Secondary | ICD-10-CM | POA: Insufficient documentation

## 2016-05-06 DIAGNOSIS — F419 Anxiety disorder, unspecified: Secondary | ICD-10-CM | POA: Insufficient documentation

## 2016-05-06 DIAGNOSIS — J329 Chronic sinusitis, unspecified: Secondary | ICD-10-CM | POA: Insufficient documentation

## 2016-05-06 DIAGNOSIS — I4891 Unspecified atrial fibrillation: Secondary | ICD-10-CM

## 2016-05-06 DIAGNOSIS — E785 Hyperlipidemia, unspecified: Secondary | ICD-10-CM | POA: Diagnosis not present

## 2016-05-06 DIAGNOSIS — I481 Persistent atrial fibrillation: Secondary | ICD-10-CM | POA: Diagnosis not present

## 2016-05-06 DIAGNOSIS — Z7901 Long term (current) use of anticoagulants: Secondary | ICD-10-CM | POA: Diagnosis not present

## 2016-05-06 DIAGNOSIS — Z91041 Radiographic dye allergy status: Secondary | ICD-10-CM | POA: Insufficient documentation

## 2016-05-06 DIAGNOSIS — G4700 Insomnia, unspecified: Secondary | ICD-10-CM | POA: Insufficient documentation

## 2016-05-06 HISTORY — PX: CARDIOVERSION: SHX1299

## 2016-05-06 SURGERY — CARDIOVERSION
Anesthesia: General

## 2016-05-06 MED ORDER — SODIUM CHLORIDE 0.9 % IV SOLN
INTRAVENOUS | Status: DC
  Administered 2016-05-06: 12:00:00 via INTRAVENOUS

## 2016-05-06 MED ORDER — PROPOFOL 10 MG/ML IV BOLUS
INTRAVENOUS | Status: DC | PRN
  Administered 2016-05-06: 80 mg via INTRAVENOUS

## 2016-05-06 MED ORDER — APIXABAN 5 MG PO TABS
5.0000 mg | ORAL_TABLET | Freq: Two times a day (BID) | ORAL | 3 refills | Status: DC
Start: 2016-05-06 — End: 2016-09-03

## 2016-05-06 MED ORDER — LIDOCAINE HCL (CARDIAC) 20 MG/ML IV SOLN
INTRAVENOUS | Status: DC | PRN
  Administered 2016-05-06: 60 mg via INTRAVENOUS

## 2016-05-06 NOTE — H&P (View-Only) (Signed)
Primary Care Physician: Mickie Hillier, MD Referring Physician:[facility] ER f/u   Anthony [name] is a 61 y.o. male with a h/o irregular heart beat intermittently for " years" presented to the [facility] ER 3/24 for influenza type illness and was found to be in afib with rvr. He was started on eliquis 5 mg bid, first full day of drug 3/25, and after IV fluids his heart rate decreased from 140 bpm to 100 bpm. He is in the afib clinic today for f/u.  He reports that he does not smoke, drink alcohol, moderate amounts of caffeine.By his wife reports , he does snore"terribly" and has witnessed apnea spells. He states that he has been tired for years and sleeps poorly. He has daytime somnolence. EKG shows afib at 124 bpm. He has been on verapamil for many years for extra heart beats.  F/u in afib clinic. He continues in afib with v rate at 76 bpm. He feels slightly better but still with fatigue. Sleep study is still pending. He will have met requirements for 3 weeks uninterrupted anticoagulation and will be eligible for cardioversion next week. Echo results reviewed.  Today, he denies symptoms of palpitations, chest pain, shortness of breath, orthopnea, PND, lower extremity edema, dizziness, presyncope, syncope, or neurologic sequela. Positive for fatigue. The patient is tolerating medications without difficulties and is otherwise without complaint today.   Past Medical History:  Diagnosis Date  . Anxiety   . Back pain   . Hyperlipidemia   . Hypertension   . Insomnia   . Migraine headache   . Reflux   . Sinusitis   . Venous stasis    Past Surgical History:  Procedure Laterality Date  . ABDOMINAL SURGERY    . ANKLE SURGERY      Current Outpatient Prescriptions  Medication Sig Dispense Refill  . ALPRAZolam (XANAX) 0.5 MG tablet Take 1 tablet (0.5 mg total) by mouth daily as needed for anxiety. 30 tablet 5  . apixaban (ELIQUIS) 5 MG TABS tablet Take 1 tablet (5 mg total) by mouth 2 (two) times daily.  60 tablet 0  . Ascorbic Acid (VITAMIN C) 1000 MG tablet Take 1,000 mg by mouth daily.    Marland Kitchen escitalopram (LEXAPRO) 10 MG tablet Take 1 tablet (10 mg total) by mouth every morning. 30 tablet 5  . ezetimibe (ZETIA) 10 MG tablet Take 1 tablet (10 mg total) by mouth daily. 90 tablet 1  . lisinopril (PRINIVIL,ZESTRIL) 20 MG tablet Take 1 tablet (20 mg total) by mouth 2 (two) times daily. 60 tablet 5  . loratadine (CLARITIN) 10 MG tablet Take 10 mg by mouth daily.    . metoprolol succinate (TOPROL XL) 25 MG 24 hr tablet Take 1 tablet (25 mg total) by mouth 2 (two) times daily. 60 tablet 2  . Multiple Vitamin (MULTIVITAMIN) tablet Take 1 tablet by mouth daily.    . SUMAtriptan (IMITREX) 50 MG tablet Take 1 tablet at first sign of headache, may take 2 tablets 2 hours later if needed 20 tablet 5  . verapamil (VERELAN PM) 240 MG 24 hr capsule Take 1 capsule (240 mg total) by mouth daily. 30 capsule 5  . vitamin E 400 UNIT capsule Take 400 Units by mouth daily.    Marland Kitchen zolpidem (AMBIEN) 10 MG tablet Take 1 tablet (10 mg total) by mouth at bedtime. 30 tablet 5   No current facility-administered medications for this encounter.     Allergies  Allergen Reactions  . Shellfish Allergy  Anaphylaxis and Nausea And Vomiting  . Acyclovir And Related   . Apple Swelling  . Asa [Aspirin] Swelling    Facial Area  . Codeine Other (See Comments)    Nightmares  . Contrast Media [Iodinated Diagnostic Agents] Hives    Needs pre meds  . Isovue [Iopamidol] Hives    Needs pre meds  . Lipitor [Atorvastatin] Other (See Comments)    Aches  . Nsaids Other (See Comments)    Unknown   . Zocor [Simvastatin] Other (See Comments)    Aches    Social History   Social History  . Marital status: Married    Spouse name: N/A  . Number of children: N/A  . Years of education: N/A   Occupational History  . Not on file.   Social History Main Topics  . Smoking status: Never Smoker  . Smokeless tobacco: Never Used  .  Alcohol use No  . Drug use: No  . Sexual activity: Not on file   Other Topics Concern  . Not on file   Social History Narrative  . No narrative on file    No family history on file.  ROS- All systems are reviewed and negative except as per the HPI above  Physical Exam: Vitals:   04/27/16 0833  BP: (!) 142/80  Weight: 203 lb 12.8 oz (92.4 kg)  Height: _0  (1.803 m)   Wt Readings from Last 3 Encounters:  04/27/16 203 lb 12.8 oz (92.4 kg)  04/21/16 199 lb (90.3 kg)  04/14/16 203 lb 12.8 oz (92.4 kg)    Labs: Lab Results  Component Value Date   NA 141 04/10/2016   K 3.8 04/10/2016   CL 109 04/10/2016   CO2 25 04/10/2016   GLUCOSE 133 (H) 04/10/2016   BUN 11 04/10/2016   CREATININE 1.19 04/10/2016   CALCIUM 9.1 04/10/2016   No results found for: INR Lab Results  Component Value Date   CHOL 226 (H) 04/08/2016   HDL 36 (L) 04/08/2016   LDLCALC 166 (H) 04/08/2016   TRIG 121 04/08/2016     GEN- The patient is well appearing, alert and oriented x 3 today.   Head- normocephalic, atraumatic Eyes-  Sclera clear, conjunctiva pink Ears- hearing intact Oropharynx- clear Neck- supple, no JVP Lymph- no cervical lymphadenopathy Lungs- Clear to ausculation bilaterally, normal work of breathing Heart-irregular rate and rhythm, no murmurs, rubs or gallops, PMI not laterally displaced GI- soft, NT, ND, + BS Extremities- no clubbing, cyanosis, or edema MS- no significant deformity or atrophy Skin- no rash or lesion Psych- euthymic mood, full affect Neuro- strength and sensation are intact  EKG-afib at 76 bpm, IRBBB, qrs int 92 ms, qtc 432 ms Epic records reviewed Echo-Study Conclusions  - Left ventricle: The cavity size was normal. Wall thickness was   normal. Systolic function was normal. The estimated ejection   fraction was in the range of 55% to 60%. Wall motion was normal;   there were no regional wall motion abnormalities. - Left atrium: The atrium was  moderately dilated. 46 mm     Assessment and Plan: 1. Newly diagnosed afib Continue eliquis 5 mg bid, will have had 3 weeks uninterrupted use on 4/15, will schedule cardioversion on 4/19 Continue verapamil  Risk vrs benefit of cardioversion discussed Continue metoprolol ER 25 mg bid but do not take am of cardioversion until can see heart rate in SR, will then determine dose going forward   2. Lifestyle issues possibly contributing to  afib Sleep study scheduled for snoring and witnessed apnea, daytime somnolence   Limit caffeine Encouraged regular exercise when rate control/SR has been obtained  F/u one week post cardioversion   Butch Penny C. Dilynn Munroe, [facility] [address] [place], [place] [address] [phone]

## 2016-05-06 NOTE — Anesthesia Procedure Notes (Signed)
Date/Time: 05/06/2016 11:29 AM Performed by: Jenne Campus Pre-anesthesia Checklist: Patient identified, Emergency Drugs available, Suction available, Patient being monitored and Timeout performed Patient Re-evaluated:Patient Re-evaluated prior to inductionOxygen Delivery Method: Ambu bag Preoxygenation: Pre-oxygenation with 100% oxygen Intubation Type: IV induction

## 2016-05-06 NOTE — CV Procedure (Signed)
    Electrical Cardioversion Procedure Note Anthony [name] [id number] May 19, 1955  Procedure: Electrical Cardioversion Indications:  Atrial Fibrillation  Time Out: Verified patient identification, verified procedure,medications/allergies/relevent history reviewed, required imaging and test results available.  Performed  Procedure Details  The patient was NPO after midnight. Anesthesia was administered at the beside  by Dr.Foster with propofol.  Cardioversion was performed with synchronized biphasic defibrillation via AP pads with 120 joules.  1 attempt(s) were performed.  The patient converted to normal sinus rhythm however after about 5 minutes, he went back into atrial fibrillation.  The patient tolerated the procedure well   IMPRESSION:  Brief NSR (36min) with return to atrial fibrillation.     Anthony [name] 05/06/2016, 12:07 PM

## 2016-05-06 NOTE — Interval H&P Note (Signed)
History and Physical Interval Note:  05/06/2016 11:52 AM  Anthony [name]  has presented today for surgery, with the diagnosis of afib  The various methods of treatment have been discussed with the patient and family. After consideration of risks, benefits and other options for treatment, the patient has consented to  Procedure(s): CARDIOVERSION (N/A) as a surgical intervention .  The patient's history has been reviewed, patient examined, no change in status, stable for surgery.  I have reviewed the patient's chart and labs.  Questions were answered to the patient's satisfaction.     [employer]

## 2016-05-06 NOTE — Discharge Instructions (Signed)
Electrical Cardioversion, Care After °This sheet gives you information about how to care for yourself after your procedure. Your health care provider may also give you more specific instructions. If you have problems or questions, contact your health care provider. °What can I expect after the procedure? °After the procedure, it is common to have: °· Some redness on the skin where the shocks were given. °Follow these instructions at home: °· Do not drive for 24 hours if you were given a medicine to help you relax (sedative). °· Take over-the-counter and prescription medicines only as told by your health care provider. °· Ask your health care provider how to check your pulse. Check it often. °· Rest for 48 hours after the procedure or as told by your health care provider. °· Avoid or limit your caffeine use as told by your health care provider. °Contact a health care provider if: °· You feel like your heart is beating too quickly or your pulse is not regular. °· You have a serious muscle cramp that does not go away. °Get help right away if: °· You have discomfort in your chest. °· You are dizzy or you feel faint. °· You have trouble breathing or you are short of breath. °· Your speech is slurred. °· You have trouble moving an arm or leg on one side of your body. °· Your fingers or toes turn cold or blue. °This information is not intended to replace advice given to you by your health care provider. Make sure you discuss any questions you have with your health care provider. °Document Released: 10/25/2012 Document Revised: 08/08/2015 Document Reviewed: 07/11/2015 °Elsevier Interactive Patient Education © 2017 [employer] ° °

## 2016-05-06 NOTE — Anesthesia Postprocedure Evaluation (Signed)
Anesthesia Post Note  Patient: Anthony [name] Alert  Procedure(s) Performed: Procedure(s) (LRB): CARDIOVERSION (N/A)  Patient location during evaluation: PACU Anesthesia Type: General Level of consciousness: awake and alert Pain management: pain level controlled Vital Signs Assessment: post-procedure vital signs reviewed and stable Respiratory status: spontaneous breathing, nonlabored ventilation and respiratory function stable Cardiovascular status: blood pressure returned to baseline and stable Postop Assessment: no signs of nausea or vomiting Anesthetic complications: no       Last Vitals:  Vitals:   05/06/16 1046 05/06/16 1207  BP: (!) 185/95 (!) 149/85  Pulse: 68 72  Resp: 15 15  Temp: 36.8 C     Last Pain:  Vitals:   05/06/16 1207  TempSrc: Oral                 Marshon Bangs A.

## 2016-05-06 NOTE — Anesthesia Preprocedure Evaluation (Addendum)
Anesthesia Evaluation  Patient identified by MRN, date of birth, ID band Patient awake    Reviewed: Allergy & Precautions, NPO status , Patient's Chart, lab work & pertinent test results, reviewed documented beta blocker date and time   Airway Mallampati: I  TM Distance: >3 FB Neck ROM: Full    Dental no notable dental hx. (+) Teeth Intact, Caps, Dental Advisory Given   Pulmonary sleep apnea ,    Pulmonary exam normal breath sounds clear to auscultation       Cardiovascular hypertension, Pt. on medications and Pt. on home beta blockers  Rhythm:Irregular Rate:Normal     Neuro/Psych  Headaches, PSYCHIATRIC DISORDERS Anxiety Depression    GI/Hepatic Neg liver ROS, GERD  Medicated and Controlled,  Endo/Other  negative endocrine ROS  Renal/GU negative Renal ROS  negative genitourinary   Musculoskeletal negative musculoskeletal ROS (+)   Abdominal   Peds  Hematology On Eliquis- last dose today   Anesthesia Other Findings   Reproductive/Obstetrics                            Anesthesia Physical Anesthesia Plan  ASA: III  Anesthesia Plan: General   Post-op Pain Management:    Induction: Intravenous  Airway Management Planned: Mask  Additional Equipment:   Intra-op Plan:   Post-operative Plan:   Informed Consent: I have reviewed the patients History and Physical, chart, labs and discussed the procedure including the risks, benefits and alternatives for the proposed anesthesia with the patient or authorized representative who has indicated his/her understanding and acceptance.   Dental advisory given  Plan Discussed with: Anesthesiologist, CRNA and Surgeon  Anesthesia Plan Comments:         Anesthesia Quick Evaluation

## 2016-05-06 NOTE — Transfer of Care (Signed)
Immediate Anesthesia Transfer of Care Note  Patient: Anthony [name]  Procedure(s) Performed: Procedure(s): CARDIOVERSION (N/A)  Patient Location: Endoscopy Unit  Anesthesia Type:General  Level of Consciousness: awake, oriented and patient cooperative  Airway & Oxygen Therapy: Patient Spontanous Breathing and Patient connected to nasal cannula oxygen  Post-op Assessment: Report given to RN and Post -op Vital signs reviewed and stable  Post vital signs: Reviewed  Last Vitals:  Vitals:   05/06/16 1046 05/06/16 1207  BP: (!) 185/95 (!) 149/85  Pulse: 68 72  Resp: 15 15  Temp: 36.8 C     Last Pain:  Vitals:   05/06/16 1207  TempSrc: Oral         Complications: No apparent anesthesia complications

## 2016-05-11 ENCOUNTER — Ambulatory Visit (HOSPITAL_COMMUNITY)
Admission: RE | Admit: 2016-05-11 | Discharge: 2016-05-11 | Disposition: A | Discharge status: Home / Self Care | Payer: 59 | Source: Ambulatory Visit | Attending: Nurse Practitioner | Admitting: Nurse Practitioner

## 2016-05-11 ENCOUNTER — Encounter (HOSPITAL_COMMUNITY): Payer: Self-pay | Admitting: Nurse Practitioner

## 2016-05-11 VITALS — BP 126/78 | HR 63 | Ht 71.0 in | Wt 201.4 lb

## 2016-05-11 DIAGNOSIS — I481 Persistent atrial fibrillation: Secondary | ICD-10-CM

## 2016-05-11 DIAGNOSIS — Z79899 Other long term (current) drug therapy: Secondary | ICD-10-CM | POA: Diagnosis not present

## 2016-05-11 DIAGNOSIS — Z9889 Other specified postprocedural states: Secondary | ICD-10-CM | POA: Insufficient documentation

## 2016-05-11 DIAGNOSIS — I4819 Other persistent atrial fibrillation: Secondary | ICD-10-CM

## 2016-05-11 DIAGNOSIS — E785 Hyperlipidemia, unspecified: Secondary | ICD-10-CM | POA: Insufficient documentation

## 2016-05-11 DIAGNOSIS — Z886 Allergy status to analgesic agent status: Secondary | ICD-10-CM | POA: Diagnosis not present

## 2016-05-11 DIAGNOSIS — Z91013 Allergy to seafood: Secondary | ICD-10-CM | POA: Insufficient documentation

## 2016-05-11 DIAGNOSIS — I10 Essential (primary) hypertension: Secondary | ICD-10-CM | POA: Insufficient documentation

## 2016-05-11 DIAGNOSIS — Z9109 Other allergy status, other than to drugs and biological substances: Secondary | ICD-10-CM | POA: Diagnosis not present

## 2016-05-11 NOTE — Progress Notes (Signed)
Primary Care Physician: Mickie Hillier, MD Referring Physician:[facility] ER f/u   Anthony [name] is a 61 y.o. male with a h/o irregular heart beat intermittently for " years" presented to the [facility] ER 3/24 for influenza type illness and was found to be in afib with rvr. He was started on eliquis 5 mg bid, first full day of drug 3/25, and after IV fluids his heart rate decreased from 140 bpm to 100 bpm. He is in the afib clinic today for f/u.  He reports that he does not smoke, drink alcohol, moderate amounts of caffeine.By his wife reports , he does snore"terribly" and has witnessed apnea spells. He states that he has been tired for years and sleeps poorly. He has daytime somnolence. EKG shows afib at 124 bpm. He has been on verapamil for many years for extra heart beats.  F/u in afib clinic. He continues in afib with v rate at 76 bpm. He feels slightly better but still with fatigue. Sleep study is still pending. He will have met requirements for 3 weeks uninterrupted anticoagulation and will be eligible for cardioversion next week. Echo results reviewed.  F/u in afib clinic, 4/24, unfortunately, pt did cardiovert for 5 mins but returned to afib.  He is in the afib clinic for f/u. Options discussed. He continues to feel poorly with poor exertional dyspnea, at times chest pressure with exertion and fatigue. Sleep study pending in May.  Today, he denies symptoms of palpitations, chest pain, shortness of breath, orthopnea, PND, lower extremity edema, dizziness, presyncope, syncope, or neurologic sequela. Positive for fatigue. The patient is tolerating medications without difficulties and is otherwise without complaint today.   Past Medical History:  Diagnosis Date  . Anxiety   . Back pain   . Hyperlipidemia   . Hypertension   . Insomnia   . Migraine headache   . Reflux   . Sinusitis   . Venous stasis    Past Surgical History:  Procedure Laterality Date  . ABDOMINAL SURGERY    . ANKLE SURGERY     . CARDIOVERSION N/A 05/06/2016   Procedure: CARDIOVERSION;  Surgeon: Jerline Pain, MD;  Location: [facility] ENDOSCOPY;  Service: Cardiovascular;  Laterality: N/A;    Current Outpatient Prescriptions  Medication Sig Dispense Refill  . ALPRAZolam (XANAX) 0.5 MG tablet Take 1 tablet (0.5 mg total) by mouth daily as needed for anxiety. 30 tablet 5  . apixaban (ELIQUIS) 5 MG TABS tablet Take 1 tablet (5 mg total) by mouth 2 (two) times daily. 60 tablet 3  . Ascorbic Acid (VITAMIN C) 1000 MG tablet Take 1,000 mg by mouth daily.    Marland Kitchen escitalopram (LEXAPRO) 10 MG tablet Take 1 tablet (10 mg total) by mouth every morning. 30 tablet 5  . ezetimibe (ZETIA) 10 MG tablet Take 1 tablet (10 mg total) by mouth daily. 90 tablet 1  . lisinopril (PRINIVIL,ZESTRIL) 20 MG tablet Take 1 tablet (20 mg total) by mouth 2 (two) times daily. 60 tablet 5  . loratadine (CLARITIN) 10 MG tablet Take 10 mg by mouth daily.    . metoprolol succinate (TOPROL XL) 25 MG 24 hr tablet Take 1 tablet (25 mg total) by mouth 2 (two) times daily. 60 tablet 2  . Multiple Vitamin (MULTIVITAMIN) tablet Take 1 tablet by mouth daily.    . SUMAtriptan (IMITREX) 50 MG tablet Take 1 tablet at first sign of headache, may take 2 tablets 2 hours later if needed 20 tablet 5  . verapamil (  VERELAN PM) 240 MG 24 hr capsule Take 1 capsule (240 mg total) by mouth daily. 30 capsule 5  . vitamin E 400 UNIT capsule Take 400 Units by mouth daily.    Marland Kitchen zolpidem (AMBIEN) 10 MG tablet Take 1 tablet (10 mg total) by mouth at bedtime. 30 tablet 5   No current facility-administered medications for this encounter.     Allergies  Allergen Reactions  . Shellfish Allergy Anaphylaxis and Nausea And Vomiting  . Acyclovir And Related   . Apple Swelling  . Asa [Aspirin] Swelling    Facial Area  . Codeine Other (See Comments)    Nightmares  . Contrast Media [Iodinated Diagnostic Agents] Hives    Needs pre meds  . Isovue [Iopamidol] Hives    Needs pre meds  .  Lipitor [Atorvastatin] Other (See Comments)    Aches  . Nsaids Other (See Comments)    Unknown   . Zocor [Simvastatin] Other (See Comments)    Aches    Social History   Social History  . Marital status: Married    Spouse name: N/A  . Number of children: N/A  . Years of education: N/A   Occupational History  . Not on file.   Social History Main Topics  . Smoking status: Never Smoker  . Smokeless tobacco: Never Used  . Alcohol use No  . Drug use: No  . Sexual activity: Not on file   Other Topics Concern  . Not on file   Social History Narrative  . No narrative on file    No family history on file.  ROS- All systems are reviewed and negative except as per the HPI above  Physical Exam: Vitals:   05/11/16 0924  BP: 126/78  Pulse: 63  Weight: 201 lb 6.4 oz (91.4 kg)  Height: '5\' 11"'  (1.803 m)   Wt Readings from Last 3 Encounters:  05/11/16 201 lb 6.4 oz (91.4 kg)  04/27/16 203 lb 12.8 oz (92.4 kg)  04/21/16 199 lb (90.3 kg)    Labs: Lab Results  Component Value Date   NA 142 04/27/2016   K 4.6 04/27/2016   CL 109 04/27/2016   CO2 27 04/27/2016   GLUCOSE 88 04/27/2016   BUN 8 04/27/2016   CREATININE 1.06 04/27/2016   CALCIUM 8.9 04/27/2016   No results found for: INR Lab Results  Component Value Date   CHOL 226 (H) 04/08/2016   HDL 36 (L) 04/08/2016   LDLCALC 166 (H) 04/08/2016   TRIG 121 04/08/2016     GEN- The patient is well appearing, alert and oriented x 3 today.   Head- normocephalic, atraumatic Eyes-  Sclera clear, conjunctiva pink Ears- hearing intact Oropharynx- clear Neck- supple, no JVP Lymph- no cervical lymphadenopathy Lungs- Clear to ausculation bilaterally, normal work of breathing Heart-irregular rate and rhythm, no murmurs, rubs or gallops, PMI not laterally displaced GI- soft, NT, ND, + BS Extremities- no clubbing, cyanosis, or edema MS- no significant deformity or atrophy Skin- no rash or lesion Psych- euthymic mood,  full affect Neuro- strength and sensation are intact  EKG-afib at  63 bpm, IRBBB,LAD, qrs int 96 ms, qtc 402 ms Epic records reviewed Echo-Study Conclusions  - Left ventricle: The cavity size was normal. Wall thickness was   normal. Systolic function was normal. The estimated ejection   fraction was in the range of 55% to 60%. Wall motion was normal;   there were no regional wall motion abnormalities. - Left atrium: The atrium was  moderately dilated. 46 mm     Assessment and Plan: 1. Newly diagnosed symptomatic persistent  afib Unfortunately did not return to SR but for 5 mins after DCCV and then return to afib AAD's discussed  In detail with pt and he would like to try flecainide Will schedule lexi myoview before starting flecainide, scheduled for 4/25 at [facility] No contraindications by echo Continue eliquis 5 mg bid  Continue verapamil but may stop if flecainide is started   Continue metoprolol ER 25 mg bid  Will schedule lexi myoview  2. Lifestyle issues possibly contributing to afib Sleep study scheduled for snoring and witnessed apnea, daytime somnolence   Limit caffeine Encouraged regular exercise when rate control/SR has been obtained  F/u per results of myoview   Khizar Fiorella C. Maxmilian Trostel, [facility] [address] [place], [place] [address] [phone]

## 2016-05-12 ENCOUNTER — Ambulatory Visit (HOSPITAL_COMMUNITY)
Admission: RE | Admit: 2016-05-12 | Discharge: 2016-05-12 | Disposition: A | Discharge status: Home / Self Care | Payer: 59 | Source: Ambulatory Visit | Attending: Cardiovascular Disease | Admitting: Cardiovascular Disease

## 2016-05-12 DIAGNOSIS — I4819 Other persistent atrial fibrillation: Secondary | ICD-10-CM

## 2016-05-12 DIAGNOSIS — I481 Persistent atrial fibrillation: Secondary | ICD-10-CM | POA: Diagnosis not present

## 2016-05-12 LAB — MYOCARDIAL PERFUSION IMAGING
Peak HR: 96 {beats}/min
Rest HR: 63 {beats}/min
SDS: 3
SRS: 1
SSS: 4
TID: 1.03

## 2016-05-12 MED ORDER — REGADENOSON 0.4 MG/5ML IV SOLN
0.4000 mg | Freq: Once | INTRAVENOUS | Status: AC
  Administered 2016-05-12: 0.4 mg via INTRAVENOUS

## 2016-05-12 MED ORDER — TECHNETIUM TC 99M TETROFOSMIN IV KIT
10.2000 | PACK | Freq: Once | INTRAVENOUS | Status: AC | PRN
  Administered 2016-05-12: 10.2 via INTRAVENOUS
  Filled 2016-05-12: qty 11

## 2016-05-12 MED ORDER — TECHNETIUM TC 99M TETROFOSMIN IV KIT
30.5000 | PACK | Freq: Once | INTRAVENOUS | Status: AC | PRN
  Administered 2016-05-12: 30.5 via INTRAVENOUS
  Filled 2016-05-12: qty 31

## 2016-05-13 ENCOUNTER — Encounter (HOSPITAL_COMMUNITY): Payer: Self-pay | Admitting: *Deleted

## 2016-05-14 ENCOUNTER — Other Ambulatory Visit: Payer: Self-pay

## 2016-05-17 ENCOUNTER — Encounter: Payer: Self-pay | Admitting: Internal Medicine

## 2016-05-17 ENCOUNTER — Ambulatory Visit (INDEPENDENT_AMBULATORY_CARE_PROVIDER_SITE_OTHER): Payer: 59 | Admitting: Internal Medicine

## 2016-05-17 VITALS — BP 156/84 | HR 73 | Ht 71.0 in | Wt 205.0 lb

## 2016-05-17 DIAGNOSIS — R0683 Snoring: Secondary | ICD-10-CM | POA: Diagnosis not present

## 2016-05-17 DIAGNOSIS — I10 Essential (primary) hypertension: Secondary | ICD-10-CM | POA: Diagnosis not present

## 2016-05-17 DIAGNOSIS — I481 Persistent atrial fibrillation: Secondary | ICD-10-CM

## 2016-05-17 DIAGNOSIS — I4819 Other persistent atrial fibrillation: Secondary | ICD-10-CM

## 2016-05-17 MED ORDER — FLECAINIDE ACETATE 100 MG PO TABS: 100.0000 mg | ORAL_TABLET | Freq: Two times a day (BID) | ORAL | 3 refills | Status: DC

## 2016-05-17 NOTE — Patient Instructions (Addendum)
Medication Instructions:  START flecainide 100mg  (1 tablet) two times daily.  Labwork: NONE  Testing/Procedures: NONE  Follow-Up: EKG-Wednesday 5/2 4PM at [facility] office: [address], [place] If you are still in A fib-we will schedule you for a cardioversion.    Your physician has recommended that you have a Cardioversion (DCCV). Electrical Cardioversion uses a jolt of electricity to your heart either through paddles or wired patches attached to your chest. This is a controlled, usually prescheduled, procedure. Defibrillation is done under light anesthesia in the hospital, and you usually go home the day of the procedure. This is done to get your heart back into a normal rhythm. You are not awake for the procedure. Please see the instruction sheet given to you today.   Your physician recommends that you schedule a follow-up appointment in: Monday June 11th at [facility] with Dr. Rayann Heman  Any Other Special Instructions Will Be Listed Below (If Applicable).     If you need a refill on your cardiac medications before your next appointment, please call your pharmacy.

## 2016-05-17 NOTE — Progress Notes (Signed)
Electrophysiology Office Note   Date:  05/17/2016   ID:  Anthony, [name] [birth year], MRN [id number]  PCP:  Mickie Hillier, MD  Cardiologist:  none Primary Electrophysiologist: Thompson Grayer, MD    CC: afib   History of Present Illness: Anthony [name] is a 61 y.o. male who presents today for electrophysiology evaluation.   He is referred by Butch Penny in the AF clinic for EP consultation regarding afib management strategies.  He was diagnosed with afib in March after presenting with the flu.  In retrospect, he has had symptoms of afib for the past year.  He reports fatigue and decreased exercise tolerance with his afib.  He has occasional palpitations.  He was initiated on eliquis and rate controlled.  He underwent cardioversion 04/2016 but quickly returned to afib.  He has a low risk myoview.  He presents today for further evaluation.  + rare dizziness  Today, he denies symptoms of exertional chest pain,  lower extremity edema, claudication,  presyncope, syncope, bleeding, or neurologic sequela. The patient is tolerating medications without difficulties and is otherwise without complaint today.    Past Medical History:  Diagnosis Date  . Anxiety   . Back pain   . Hyperlipidemia   . Hypertension   . Insomnia   . Migraine headache   . Persistent atrial fibrillation ([place])   . Reflux   . Sinusitis   . Venous stasis    Past Surgical History:  Procedure Laterality Date  . ABDOMINAL SURGERY    . ANKLE SURGERY    . CARDIOVERSION N/A 05/06/2016   Procedure: CARDIOVERSION;  Surgeon: Jerline Pain, MD;  Location: [facility] ENDOSCOPY;  Service: Cardiovascular;  Laterality: N/A;     Current Outpatient Prescriptions  Medication Sig Dispense Refill  . ALPRAZolam (XANAX) 0.5 MG tablet Take 1 tablet (0.5 mg total) by mouth daily as needed for anxiety. 30 tablet 5  . apixaban (ELIQUIS) 5 MG TABS tablet Take 1 tablet (5 mg total) by mouth 2 (two) times daily. 60 tablet 3  . Ascorbic Acid (VITAMIN C)  1000 MG tablet Take 1,000 mg by mouth daily.    Marland Kitchen escitalopram (LEXAPRO) 10 MG tablet Take 1 tablet (10 mg total) by mouth every morning. 30 tablet 5  . ezetimibe (ZETIA) 10 MG tablet Take 1 tablet (10 mg total) by mouth daily. 90 tablet 1  . lisinopril (PRINIVIL,ZESTRIL) 20 MG tablet Take 1 tablet (20 mg total) by mouth 2 (two) times daily. 60 tablet 5  . loratadine (CLARITIN) 10 MG tablet Take 10 mg by mouth daily.    . metoprolol succinate (TOPROL XL) 25 MG 24 hr tablet Take 1 tablet (25 mg total) by mouth 2 (two) times daily. 60 tablet 2  . Multiple Vitamin (MULTIVITAMIN) tablet Take 1 tablet by mouth daily.    . SUMAtriptan (IMITREX) 50 MG tablet Take 1 tablet at first sign of headache, may take 2 tablets 2 hours later if needed 20 tablet 5  . verapamil (VERELAN PM) 240 MG 24 hr capsule Take 1 capsule (240 mg total) by mouth daily. 30 capsule 5  . vitamin E 400 UNIT capsule Take 400 Units by mouth daily.    Marland Kitchen zolpidem (AMBIEN) 10 MG tablet Take 1 tablet (10 mg total) by mouth at bedtime. 30 tablet 5   No current facility-administered medications for this visit.     Allergies:   Shellfish allergy; Acyclovir and related; Apple; Asa [aspirin]; Codeine; Contrast media [iodinated diagnostic agents]; Isovue [iopamidol];  Lipitor [atorvastatin]; Nsaids; and Zocor [simvastatin]   Social History:  The patient  reports that he has never smoked. He has never used smokeless tobacco. He reports that he does not drink alcohol or use drugs.   Family History:  The patient's  family history includes Heart attack in his brother and father; Heart failure in his father; Hyperlipidemia in his brother and father; Hypertension in his brother, father, and mother.    ROS:  Please see the history of present illness.   All other systems are personally reviewed and negative.    PHYSICAL EXAM: VS:  BP (!) 156/84   Pulse 73   Ht 5\' 11"  (1.803 m)   Wt 205 lb (93 kg)   SpO2 98%   BMI 28.59 kg/m  , BMI Body mass  index is 28.59 kg/m. GEN: Well nourished, well developed, in no acute distress  HEENT: normal  Neck: no JVD, carotid bruits, or masses Cardiac: iRRR; no murmurs, rubs, or gallops,no edema  Respiratory:  clear to auscultation bilaterally, normal work of breathing GI: soft, nontender, nondistended, + BS MS: no deformity or atrophy  Skin: warm and dry  Neuro:  Strength and sensation are intact Psych: euthymic mood, full affect  EKG:  EKG is ordered today. The ekg ordered today is personally reviewed and shows afib, V rate 57 bpm, QRS 94 msec, Qtc 414 msec, LVH   Recent Labs: 04/08/2016: TSH 2.720 04/10/2016: ALT 31 04/27/2016: BUN 8; Creatinine, Ser 1.06; Hemoglobin 13.7; Platelets 277; Potassium 4.6; Sodium 142  personally reviewed   Lipid Panel     Component Value Date/Time   CHOL 226 (H) 04/08/2016 0806   TRIG 121 04/08/2016 0806   HDL 36 (L) 04/08/2016 0806   CHOLHDL 6.3 (H) 04/08/2016 0806   CHOLHDL 6.3 06/05/2013 0724   VLDL 23 06/05/2013 0724   LDLCALC 166 (H) 04/08/2016 0806   personally reviewed   Wt Readings from Last 3 Encounters:  05/17/16 205 lb (93 kg)  05/12/16 201 lb (91.2 kg)  05/11/16 201 lb 6.4 oz (91.4 kg)      Other studies personally reviewed: Additional studies/ records that were reviewed today include: AF clinic notes, Dr Marlou Porch cardioversion note, echo  Review of the above records today demonstrates: preserved EF, mild LA enlargement, no valvular disease   ASSESSMENT AND PLAN:  1.  Persistent afib The patient has symptomatic persistent afib.  His chads2vascs score is 1.  He is on eliquis. Therapeutic strategies for afib including medicine and ablation were discussed in detail with the patient today. Risk, benefits, and alternatives to EP study and radiofrequency ablation for afib were also discussed in detail today. At this time, I would advise flecainide.  Will start flecainide 100mg  BID.  Return on Wed to [place] office for an EKG.  If still  in AF, will require cardioversion to be arranged at that time.  Pt is aware of risks of cardioversion.  Importance of compliance with eliquis without interruption was discussed today.  2. snoring Sleep study is pending  3. HTN Elevated Will need aggressive BP control in order to maintain sinus rhythm long term  Return to see me 4 weeks after cardioversion   Current medicines are reviewed at length with the patient today.   The patient does not have concerns regarding his medicines.  The following changes were made today:  none   Signed, Thompson Grayer, MD  05/17/2016 9:03 AM     [facility] [address]  [place] [address] [phone] (office) [phone] (fax)

## 2016-05-19 ENCOUNTER — Ambulatory Visit (INDEPENDENT_AMBULATORY_CARE_PROVIDER_SITE_OTHER): Payer: 59 | Admitting: *Deleted

## 2016-05-19 DIAGNOSIS — I4819 Other persistent atrial fibrillation: Secondary | ICD-10-CM

## 2016-05-19 DIAGNOSIS — I481 Persistent atrial fibrillation: Secondary | ICD-10-CM

## 2016-05-19 NOTE — Progress Notes (Signed)
Pt reports being tired and feeling weak. States that he feels his heart is out of rhythm.

## 2016-05-20 ENCOUNTER — Telehealth: Payer: Self-pay | Admitting: Internal Medicine

## 2016-05-20 NOTE — Telephone Encounter (Signed)
New message      Calling to get the date of the cardioversion that was to be scheduled.

## 2016-05-21 ENCOUNTER — Other Ambulatory Visit: Payer: Self-pay | Admitting: *Deleted

## 2016-05-21 ENCOUNTER — Telehealth: Payer: Self-pay | Admitting: *Deleted

## 2016-05-21 ENCOUNTER — Encounter (HOSPITAL_COMMUNITY): Payer: Self-pay | Admitting: *Deleted

## 2016-05-21 ENCOUNTER — Telehealth: Payer: Self-pay | Admitting: Internal Medicine

## 2016-05-21 DIAGNOSIS — Z01812 Encounter for preprocedural laboratory examination: Secondary | ICD-10-CM

## 2016-05-21 DIAGNOSIS — Z01818 Encounter for other preprocedural examination: Secondary | ICD-10-CM

## 2016-05-21 DIAGNOSIS — I4891 Unspecified atrial fibrillation: Secondary | ICD-10-CM

## 2016-05-21 NOTE — Telephone Encounter (Addendum)
Returned call to patient-patient request to schedule DCCV for Monday 5/7  DCCV scheduled for Monday 5/7 at 10AM with Dr. Stanford Breed ([facility]).  Will have lab work today-ordered STAT in order to get results back prior.    Patient made aware to arrive at [place] at [facility] [place] at midnight -aware to take eliquis, do not miss any doses. -ok to take remaining medication with sip of water -have someone to drive you home -bring all medications and insurance cards to hospital.    Hospital orders placed.  Patient aware, no further questions and verbalized understanding.

## 2016-05-21 NOTE — Telephone Encounter (Signed)
Spoke to patient-verified patient had pre procedure labs drawn today.  Patient had labs drawn before noon-not showing as "in process" in lab results.    Called [facility]-not processed as STAT but will sent as high priority to have results prior to procedure on Monday.    Patient made aware.

## 2016-05-21 NOTE — Telephone Encounter (Signed)
Attempt to contact patient to schedule DCCV-no answer, lmtcb.

## 2016-05-21 NOTE — Telephone Encounter (Signed)
°  New Message ° ° pt verbalized that he is calling for rn  °

## 2016-05-21 NOTE — Telephone Encounter (Signed)
See telephone note from 5/4

## 2016-05-22 LAB — CBC
Hematocrit: 40.4 % (ref 37.5–51.0)
Hemoglobin: 13.8 g/dL (ref 13.0–17.7)
MCH: 30.7 pg (ref 26.6–33.0)
MCHC: 34.2 g/dL (ref 31.5–35.7)
MCV: 90 fL (ref 79–97)
Platelets: 187 x10E3/uL (ref 150–379)
RBC: 4.5 x10E6/uL (ref 4.14–5.80)
RDW: 13.7 % (ref 12.3–15.4)
WBC: 9.2 x10E3/uL (ref 3.4–10.8)

## 2016-05-22 LAB — BASIC METABOLIC PANEL WITH GFR
BUN/Creatinine Ratio: 12 (ref 10–24)
BUN: 14 mg/dL (ref 8–27)
CO2: 24 mmol/L (ref 18–29)
Calcium: 9.2 mg/dL (ref 8.6–10.2)
Chloride: 103 mmol/L (ref 96–106)
Creatinine, Ser: 1.18 mg/dL (ref 0.76–1.27)
GFR calc Af Amer: 77 mL/min/1.73
GFR calc non Af Amer: 67 mL/min/1.73
Glucose: 85 mg/dL (ref 65–99)
Potassium: 4.2 mmol/L (ref 3.5–5.2)
Sodium: 142 mmol/L (ref 134–144)

## 2016-05-24 ENCOUNTER — Encounter (HOSPITAL_COMMUNITY)
Admission: RE | Disposition: A | Discharge status: Home / Self Care | Payer: Self-pay | Source: Ambulatory Visit | Attending: Internal Medicine

## 2016-05-24 ENCOUNTER — Ambulatory Visit (HOSPITAL_COMMUNITY)
Admission: RE | Admit: 2016-05-24 | Discharge: 2016-05-24 | Disposition: A | Discharge status: Home / Self Care | Payer: 59 | Source: Ambulatory Visit | Attending: Internal Medicine | Admitting: Internal Medicine

## 2016-05-24 ENCOUNTER — Encounter (HOSPITAL_COMMUNITY): Payer: Self-pay | Admitting: Certified Registered Nurse Anesthetist

## 2016-05-24 ENCOUNTER — Encounter (HOSPITAL_COMMUNITY): Payer: Self-pay

## 2016-05-24 DIAGNOSIS — G4700 Insomnia, unspecified: Secondary | ICD-10-CM | POA: Insufficient documentation

## 2016-05-24 DIAGNOSIS — Z7901 Long term (current) use of anticoagulants: Secondary | ICD-10-CM | POA: Diagnosis not present

## 2016-05-24 DIAGNOSIS — F419 Anxiety disorder, unspecified: Secondary | ICD-10-CM | POA: Diagnosis not present

## 2016-05-24 DIAGNOSIS — K219 Gastro-esophageal reflux disease without esophagitis: Secondary | ICD-10-CM | POA: Insufficient documentation

## 2016-05-24 DIAGNOSIS — I481 Persistent atrial fibrillation: Secondary | ICD-10-CM | POA: Diagnosis present

## 2016-05-24 DIAGNOSIS — E785 Hyperlipidemia, unspecified: Secondary | ICD-10-CM | POA: Diagnosis not present

## 2016-05-24 DIAGNOSIS — I10 Essential (primary) hypertension: Secondary | ICD-10-CM | POA: Insufficient documentation

## 2016-05-24 DIAGNOSIS — Z79899 Other long term (current) drug therapy: Secondary | ICD-10-CM | POA: Diagnosis not present

## 2016-05-24 DIAGNOSIS — Z5309 Procedure and treatment not carried out because of other contraindication: Secondary | ICD-10-CM | POA: Diagnosis not present

## 2016-05-24 SURGERY — CANCELLED PROCEDURE

## 2016-05-24 NOTE — Procedures (Signed)
Pt presented for DCCV; ECG shows sinus rhythm; procedure canceled; continue present meds.   Kirk Ruths 05/24/2016, 7:23 AM

## 2016-05-24 NOTE — H&P (Addendum)
Office Visit   05/17/2016 [facility]  Thompson Grayer, MD  Cardiology   Persistent atrial fibrillation [facility]) +2 more  Dx   Atrial Fibrillation; Referred by Mikey Kirschner, MD  Reason for Visit   Additional Documentation   Vitals:   BP  156/84   Pulse 73   Ht 5\' 11"  (1.803 m)   Wt 205 lb (93 kg)   SpO2 98%   BMI 28.59 kg/m   BSA 2.16 m   Flowsheets:   Custom Formula Data,   MEWS Score,   Anthropometrics     Encounter Info:   Billing Info,   History,   Allergies,   Detailed Report     All Notes   Procedures by Thompson Grayer, MD at 05/17/2016 4:36 PM   Author: Thompson Grayer, MD Author Type: Physician Filed: 05/17/2016 4:36 PM  Note Status: Signed Cosign: Cosign Not Required Encounter Date: 05/17/2016  Editor: Sallee Provencal L        Scan on 05/17/2016 4:36 PM by Myles Rosenthal : Ekg - [facility]  Progress Notes by Thompson Grayer, MD at 05/17/2016 8:30 AM   Author: Thompson Grayer, MD Author Type: Physician Filed: 05/17/2016 11:17 AM  Note Status: Signed Cosign: Cosign Not Required Encounter Date: 05/17/2016  Editor: Thompson Grayer, MD (Physician)  Expand All Collapse All      Electrophysiology Office Note   Date:  05/17/2016   ID:  Anthony, [name] 1955-05-19, MRN [id number]  PCP:  Mickie Hillier, MD          Cardiologist:  none Primary Electrophysiologist: Thompson Grayer, MD       CC: afib   History of Present Illness: Anthony [name] is a 61 y.o. male who presents today for electrophysiology evaluation.   He is referred by Butch Penny in the AF clinic for EP consultation regarding afib management strategies.  He was diagnosed with afib in March after presenting with the flu.  In retrospect, he has had symptoms of afib for the past year.  He reports fatigue and decreased exercise tolerance with his afib.  He has occasional palpitations.  He was initiated on eliquis and rate controlled.  He underwent cardioversion 04/2016 but quickly  returned to afib.  He has a low risk myoview.  He presents today for further evaluation.  + rare dizziness  Today, he denies symptoms of exertional chest pain,  lower extremity edema, claudication,  presyncope, syncope, bleeding, or neurologic sequela. The patient is tolerating medications without difficulties and is otherwise without complaint today.        Past Medical History:  Diagnosis Date  . Anxiety   . Back pain   . Hyperlipidemia   . Hypertension   . Insomnia   . Migraine headache   . Persistent atrial fibrillation ([place])   . Reflux   . Sinusitis   . Venous stasis         Past Surgical History:  Procedure Laterality Date  . ABDOMINAL SURGERY    . ANKLE SURGERY    . CARDIOVERSION N/A 05/06/2016   Procedure: CARDIOVERSION;  Surgeon: Jerline Pain, MD;  Location: [facility] ENDOSCOPY;  Service: Cardiovascular;  Laterality: N/A;           Current Outpatient Prescriptions  Medication Sig Dispense Refill  . ALPRAZolam (XANAX) 0.5 MG tablet Take 1 tablet (0.5 mg total) by mouth daily as needed for anxiety. 30 tablet 5  . apixaban (ELIQUIS) 5 MG TABS tablet Take 1  tablet (5 mg total) by mouth 2 (two) times daily. 60 tablet 3  . Ascorbic Acid (VITAMIN C) 1000 MG tablet Take 1,000 mg by mouth daily.    Marland Kitchen escitalopram (LEXAPRO) 10 MG tablet Take 1 tablet (10 mg total) by mouth every morning. 30 tablet 5  . ezetimibe (ZETIA) 10 MG tablet Take 1 tablet (10 mg total) by mouth daily. 90 tablet 1  . lisinopril (PRINIVIL,ZESTRIL) 20 MG tablet Take 1 tablet (20 mg total) by mouth 2 (two) times daily. 60 tablet 5  . loratadine (CLARITIN) 10 MG tablet Take 10 mg by mouth daily.    . metoprolol succinate (TOPROL XL) 25 MG 24 hr tablet Take 1 tablet (25 mg total) by mouth 2 (two) times daily. 60 tablet 2  . Multiple Vitamin (MULTIVITAMIN) tablet Take 1 tablet by mouth daily.    . SUMAtriptan (IMITREX) 50 MG tablet Take 1 tablet at first sign of headache, may take 2  tablets 2 hours later if needed 20 tablet 5  . verapamil (VERELAN PM) 240 MG 24 hr capsule Take 1 capsule (240 mg total) by mouth daily. 30 capsule 5  . vitamin E 400 UNIT capsule Take 400 Units by mouth daily.    Marland Kitchen zolpidem (AMBIEN) 10 MG tablet Take 1 tablet (10 mg total) by mouth at bedtime. 30 tablet 5   No current facility-administered medications for this visit.     Allergies:   Shellfish allergy; Acyclovir and related; Apple; Asa [aspirin]; Codeine; Contrast media [iodinated diagnostic agents]; Isovue [iopamidol]; Lipitor [atorvastatin]; Nsaids; and Zocor [simvastatin]   Social History:  The patient  reports that he has never smoked. He has never used smokeless tobacco. He reports that he does not drink alcohol or use drugs.   Family History:  The patient's  family history includes Heart attack in his brother and father; Heart failure in his father; Hyperlipidemia in his brother and father; Hypertension in his brother, father, and mother.    ROS:  Please see the history of present illness.   All other systems are personally reviewed and negative.    PHYSICAL EXAM: VS:  BP (!) 156/84   Pulse 73   Ht 5\' 11"  (1.803 m)   Wt 205 lb (93 kg)   SpO2 98%   BMI 28.59 kg/m  , BMI Body mass index is 28.59 kg/m. GEN: Well nourished, well developed, in no acute distress  HEENT: normal  Neck: no JVD, carotid bruits, or masses Cardiac: iRRR; no murmurs, rubs, or gallops,no edema  Respiratory:  clear to auscultation bilaterally, normal work of breathing GI: soft, nontender, nondistended, + BS MS: no deformity or atrophy  Skin: warm and dry  Neuro:  Strength and sensation are intact Psych: euthymic mood, full affect  EKG:  EKG is ordered today. The ekg ordered today is personally reviewed and shows afib, V rate 57 bpm, QRS 94 msec, Qtc 414 msec, LVH   Recent Labs: 04/08/2016: TSH 2.720 04/10/2016: ALT 31 04/27/2016: BUN 8; Creatinine, Ser 1.06; Hemoglobin 13.7; Platelets  277; Potassium 4.6; Sodium 142  personally reviewed   Lipid Panel  Labs (Brief)          Component Value Date/Time   CHOL 226 (H) 04/08/2016 0806   TRIG 121 04/08/2016 0806   HDL 36 (L) 04/08/2016 0806   CHOLHDL 6.3 (H) 04/08/2016 0806   CHOLHDL 6.3 06/05/2013 0724   VLDL 23 06/05/2013 0724   LDLCALC 166 (H) 04/08/2016 0806     personally reviewed  Wt Readings from Last 3 Encounters:  05/17/16 205 lb (93 kg)  05/12/16 201 lb (91.2 kg)  05/11/16 201 lb 6.4 oz (91.4 kg)      Other studies personally reviewed: Additional studies/ records that were reviewed today include: AF clinic notes, Dr Marlou Porch cardioversion note, echo  Review of the above records today demonstrates: preserved EF, mild LA enlargement, no valvular disease   ASSESSMENT AND PLAN:  1.  Persistent afib The patient has symptomatic persistent afib.  His chads2vascs score is 1.  He is on eliquis. Therapeutic strategies for afib including medicine and ablation were discussed in detail with the patient today. Risk, benefits, and alternatives to EP study and radiofrequency ablation for afib were also discussed in detail today. At this time, I would advise flecainide.  Will start flecainide 100mg  BID.  Return on Wed to [place] office for an EKG.  If still in AF, will require cardioversion to be arranged at that time.  Pt is aware of risks of cardioversion.  Importance of compliance with eliquis without interruption was discussed today.  2. snoring Sleep study is pending  3. HTN Elevated Will need aggressive BP control in order to maintain sinus rhythm long term  Return to see me 4 weeks after cardioversion   Current medicines are reviewed at length with the patient today.   The patient does not have concerns regarding his medicines.  The following changes were made today:  none   Signed, Thompson Grayer, MD  05/17/2016 9:03 AM     [facility] [address] [place] [address] [phone] (office) [phone] (fax)     For DCCV; pt has been compliant with apixaban. Kirk Ruths, MD Addendum; pre ECG shows sinus; procedure canceled. Kirk Ruths, MD

## 2016-06-01 ENCOUNTER — Telehealth (HOSPITAL_COMMUNITY): Payer: Self-pay | Admitting: *Deleted

## 2016-06-01 MED ORDER — METOPROLOL SUCCINATE ER 25 MG PO TB24: 25.0000 mg | ORAL_TABLET | Freq: Every day | ORAL | 2 refills | Status: DC

## 2016-06-01 NOTE — Telephone Encounter (Signed)
Patient called in stating since returning to [facility] his heart rate has been in the 40-50 range with increased fatigue. Discussed with Roderic Palau NP will decrease metoprolol to 25mg  once a day. Pt will call with report of HRs in a week or so.

## 2016-06-06 ENCOUNTER — Ambulatory Visit (HOSPITAL_BASED_OUTPATIENT_CLINIC_OR_DEPARTMENT_OTHER): Payer: 59 | Attending: Nurse Practitioner | Admitting: Cardiology

## 2016-06-06 DIAGNOSIS — I4891 Unspecified atrial fibrillation: Secondary | ICD-10-CM | POA: Insufficient documentation

## 2016-06-06 DIAGNOSIS — G4733 Obstructive sleep apnea (adult) (pediatric): Secondary | ICD-10-CM | POA: Insufficient documentation

## 2016-06-06 DIAGNOSIS — I4892 Unspecified atrial flutter: Secondary | ICD-10-CM | POA: Insufficient documentation

## 2016-06-07 NOTE — Procedures (Signed)
   Patient Name: Anthony, [name] Date: 06/06/2016 Gender: Male D.O.B: 1955-06-29 Age (years): 60 Referring Provider: Sherran Needs Height (inches): 39 Interpreting Physician: Fransico Him MD, ABSM Weight (lbs): 200 RPSGT: Joni Reining BMI: 28 MRN: [id number] Neck Size: 17.00  CLINICAL INFORMATION Sleep Study Type: NPSG  Indication for sleep study: Excessive Daytime Sleepiness, Fatigue, Hypertension, Snoring, Witnessed Apneas  Epworth Sleepiness Score: 17  SLEEP STUDY TECHNIQUE As per the AASM Manual for the Scoring of Sleep and Associated Events v2.3 (April 2016) with a hypopnea requiring 4% desaturations.  The channels recorded and monitored were frontal, central and occipital EEG, electrooculogram (EOG), submentalis EMG (chin), nasal and oral airflow, thoracic and abdominal wall motion, anterior tibialis EMG, snore microphone, electrocardiogram, and pulse oximetry.  MEDICATIONS Medications self-administered by patient taken the night of the study : N/A  SLEEP ARCHITECTURE The study was initiated at 10:38:54 PM and ended at 5:09:35 AM.  Sleep onset time was 20.9 minutes and the sleep efficiency was 70.8%. The total sleep time was 276.7 minutes.  Stage REM latency was 356.5 minutes.  The patient spent 4.34% of the night in stage N1 sleep, 91.15% in stage N2 sleep, 0.00% in stage N3 and 4.52% in REM.  Alpha intrusion was absent.  Supine sleep was 20.96%.  RESPIRATORY PARAMETERS The overall apnea/hypopnea index (AHI) was 13.0 per hour. There were 36 total apneas, including 34 obstructive, 0 central and 2 mixed apneas. There were 24 hypopneas and 6 RERAs.  The AHI during Stage REM sleep was 0.0 per hour.  AHI while supine was 60.0 per hour.  The mean oxygen saturation was 92.13%. The minimum SpO2 during sleep was 82.00%.  Loud snoring was noted during this study.  CARDIAC DATA The 2 lead EKG demonstrated atrial flutter with variable block. The mean  heart rate was 70.98 beats per minute.   LEG MOVEMENT DATA The total PLMS were 75 with a resulting PLMS index of 16.26. Associated arousal with leg movement index was 0.9 .  IMPRESSIONS - Mild obstructive sleep apnea occurred during this study (AHI = 13.0/h). - No significant central sleep apnea occurred during this study (CAI = 0.0/h). - Mild oxygen desaturation was noted during this study (Min O2 = 82.00%). - The patient snored with Loud snoring volume. - Atrial flutter with variable block noted during this study. - Mild periodic limb movements of sleep occurred during the study. No significant associated arousals.  DIAGNOSIS - Obstructive Sleep Apnea (327.23 [G47.33 ICD-10]) - Atrial Flutter with variable block  RECOMMENDATIONS - Therapeutic CPAP titration to determine optimal pressure required to alleviate sleep disordered breathing. - Positional therapy avoiding supine position during sleep. - Avoid alcohol, sedatives and other CNS depressants that may worsen sleep apnea and disrupt normal sleep architecture. - Sleep hygiene should be reviewed to assess factors that may improve sleep quality. - Weight management and regular exercise should be initiated or continued if appropriate.  [place], American Board of Sleep Medicine  ELECTRONICALLY SIGNED ON:  06/07/2016, 12:57 PM [place] PH: [phone]   FX: [phone] [place]

## 2016-06-08 ENCOUNTER — Telehealth: Payer: Self-pay | Admitting: *Deleted

## 2016-06-08 DIAGNOSIS — G4733 Obstructive sleep apnea (adult) (pediatric): Secondary | ICD-10-CM

## 2016-06-08 NOTE — Telephone Encounter (Addendum)
Informed patient of sleep study results and recommendations patient understanding was verbalized. Patient understands his sleep study is scheduled for Tuesday August 10 2016 Patient understands his Titration study will be done at [facility] sleep lab. Patient understands he will receive a sleep letter in a week or so. Patient understands to call if he does not receive the sleep letter in a timely manner. Patient agrees with treatment and thanked me for call

## 2016-06-08 NOTE — Telephone Encounter (Signed)
-----   Message from Sueanne Margarita, MD sent at 06/07/2016 12:59 PM EDT ----- Please let patient know that they have sleep apnea and recommend CPAP titration. Please set up titration in the sleep lab.

## 2016-06-10 ENCOUNTER — Other Ambulatory Visit (HOSPITAL_COMMUNITY): Payer: Self-pay | Admitting: *Deleted

## 2016-06-10 ENCOUNTER — Encounter: Payer: Self-pay | Admitting: *Deleted

## 2016-06-10 MED ORDER — METOPROLOL SUCCINATE ER 25 MG PO TB24: 25.0000 mg | ORAL_TABLET | Freq: Every day | ORAL | 2 refills | Status: DC

## 2016-06-11 ENCOUNTER — Other Ambulatory Visit (HOSPITAL_COMMUNITY): Payer: Self-pay | Admitting: *Deleted

## 2016-06-11 MED ORDER — METOPROLOL SUCCINATE ER 25 MG PO TB24: 25.0000 mg | ORAL_TABLET | Freq: Two times a day (BID) | ORAL | 2 refills | Status: DC

## 2016-06-15 ENCOUNTER — Ambulatory Visit: Payer: 59 | Admitting: Family Medicine

## 2016-06-28 ENCOUNTER — Ambulatory Visit (INDEPENDENT_AMBULATORY_CARE_PROVIDER_SITE_OTHER): Payer: 59 | Admitting: Internal Medicine

## 2016-06-28 ENCOUNTER — Encounter: Payer: Self-pay | Admitting: Internal Medicine

## 2016-06-28 ENCOUNTER — Other Ambulatory Visit: Payer: Self-pay

## 2016-06-28 VITALS — BP 148/102 | HR 79 | Ht 71.0 in | Wt 208.2 lb

## 2016-06-28 DIAGNOSIS — I4819 Other persistent atrial fibrillation: Secondary | ICD-10-CM

## 2016-06-28 DIAGNOSIS — G4733 Obstructive sleep apnea (adult) (pediatric): Secondary | ICD-10-CM | POA: Diagnosis not present

## 2016-06-28 DIAGNOSIS — I481 Persistent atrial fibrillation: Secondary | ICD-10-CM | POA: Diagnosis not present

## 2016-06-28 DIAGNOSIS — I10 Essential (primary) hypertension: Secondary | ICD-10-CM

## 2016-06-28 MED ORDER — HYDROCHLOROTHIAZIDE 25 MG PO TABS: 12.5000 mg | ORAL_TABLET | Freq: Every day | ORAL | 3 refills | Status: DC

## 2016-06-28 NOTE — Patient Instructions (Addendum)
Medication Instructions:  Your physician has recommended you make the following change in your medication:  1) Stop Flecainide 2) Start HCTZ 12.5 mg dail   Labwork: None ordered   Testing/Procedures: Your physician has recommended that you have an ablation. Catheter ablation is a medical procedure used to treat some cardiac arrhythmias (irregular heartbeats). During catheter ablation, a long, thin, flexible tube is put into a blood vessel in your groin (upper thigh), or neck. This tube is called an ablation catheter. It is then guided to your heart through the blood vessel. Radio frequency waves destroy small areas of heart tissue where abnormal heartbeats may cause an arrhythmia to start. Please see the instruction sheet given to you today.  Your physician has requested that you have a TEE. During a TEE, sound waves are used to create images of your heart. It provides your doctor with information about the size and shape of your heart and how well your heart's chambers and valves are working. In this test, a transducer is attached to the end of a flexible tube that's guided down your throat and into your esophagus (the tube leading from you mouth to your stomach) to get a more detailed image of your heart. You are not awake for the procedure. Please see the instruction sheet given to you today. For further information please visit [email or web address].   Call back if you decide to proceed with procedure.  Leave message for Janan Halter, RN [phone]   Follow-Up: Your physician recommends that you schedule a follow-up appointment in: 3 months with Roderic Palau, NP   Any Other Special Instructions Will Be Listed Below (If Applicable).   Low-Sodium Eating Plan Sodium, which is an element that makes up salt, helps you maintain a healthy balance of fluids in your body. Too much sodium can increase your blood pressure and cause fluid and waste to be held in your body. Your health care  provider or dietitian may recommend following this plan if you have high blood pressure (hypertension), kidney disease, liver disease, or heart failure. Eating less sodium can help lower your blood pressure, reduce swelling, and protect your heart, liver, and kidneys. What are tips for following this plan? General guidelines  Most people on this plan should limit their sodium intake to 2,000 mg (milligrams) of sodium each day. Reading food labels  The Nutrition Facts label lists the amount of sodium in one serving of the food. If you eat more than one serving, you must multiply the listed amount of sodium by the number of servings.  Choose foods with less than 140 mg of sodium per serving.  Avoid foods with 300 mg of sodium or more per serving. Shopping  Look for lower-sodium products, often labeled as "low-sodium" or "no salt added."  Always check the sodium content even if foods are labeled as "unsalted" or "no salt added".  Buy fresh foods. ? Avoid canned foods and premade or frozen meals. ? Avoid canned, cured, or processed meats  Buy breads that have less than 80 mg of sodium per slice. Cooking  Eat more home-cooked food and less restaurant, buffet, and fast food.  Avoid adding salt when cooking. Use salt-free seasonings or herbs instead of table salt or sea salt. Check with your health care provider or pharmacist before using salt substitutes.  Cook with plant-based oils, such as canola, sunflower, or olive oil. Meal planning  When eating at a restaurant, ask that your food be prepared with less salt or no salt,  if possible.  Avoid foods that contain MSG (monosodium glutamate). MSG is sometimes added to [place] food, bouillon, and some canned foods. What foods are recommended? The items listed may not be a complete list. Talk with your dietitian about what dietary choices are best for you. Grains Low-sodium cereals, including oats, puffed wheat and rice, and shredded  wheat. Low-sodium crackers. Unsalted rice. Unsalted pasta. Low-sodium bread. Whole-grain breads and whole-grain pasta. Vegetables Fresh or frozen vegetables. "No salt added" canned vegetables. "No salt added" tomato sauce and paste. Low-sodium or reduced-sodium tomato and vegetable juice. Fruits Fresh, frozen, or canned fruit. Fruit juice. Meats and other protein foods Fresh or frozen (no salt added) meat, poultry, seafood, and fish. Low-sodium canned tuna and salmon. Unsalted nuts. Dried peas, beans, and lentils without added salt. Unsalted canned beans. Eggs. Unsalted nut butters. Dairy Milk. Soy milk. Cheese that is naturally low in sodium, such as ricotta cheese, fresh mozzarella, or Swiss cheese Low-sodium or reduced-sodium cheese. Cream cheese. Yogurt. Fats and oils Unsalted butter. Unsalted margarine with no trans fat. Vegetable oils such as canola or olive oils. Seasonings and other foods Fresh and dried herbs and spices. Salt-free seasonings. Low-sodium mustard and ketchup. Sodium-free salad dressing. Sodium-free light mayonnaise. Fresh or refrigerated horseradish. Lemon juice. Vinegar. Homemade, reduced-sodium, or low-sodium soups. Unsalted popcorn and pretzels. Low-salt or salt-free chips. What foods are not recommended? The items listed may not be a complete list. Talk with your dietitian about what dietary choices are best for you. Grains Instant hot cereals. Bread stuffing, pancake, and biscuit mixes. Croutons. Seasoned rice or pasta mixes. Noodle soup cups. Boxed or frozen macaroni and cheese. Regular salted crackers. Self-rising flour. Vegetables Sauerkraut, pickled vegetables, and relishes. Olives. [place] fries. Onion rings. Regular canned vegetables (not low-sodium or reduced-sodium). Regular canned tomato sauce and paste (not low-sodium or reduced-sodium). Regular tomato and vegetable juice (not low-sodium or reduced-sodium). Frozen vegetables in sauces. Meats and other protein  foods Meat or fish that is salted, canned, smoked, spiced, or pickled. Bacon, ham, sausage, hotdogs, corned beef, chipped beef, packaged lunch meats, salt pork, jerky, pickled herring, anchovies, regular canned tuna, sardines, salted nuts. Dairy Processed cheese and cheese spreads. Cheese curds. Blue cheese. Feta cheese. String cheese. Regular cottage cheese. Buttermilk. Canned milk. Fats and oils Salted butter. Regular margarine. Ghee. Bacon fat. Seasonings and other foods Onion salt, garlic salt, seasoned salt, table salt, and sea salt. Canned and packaged gravies. Worcestershire sauce. Tartar sauce. Barbecue sauce. Teriyaki sauce. Soy sauce, including reduced-sodium. Steak sauce. Fish sauce. Oyster sauce. Cocktail sauce. Horseradish that you find on the shelf. Regular ketchup and mustard. Meat flavorings and tenderizers. Bouillon cubes. Hot sauce and Tabasco sauce. Premade or packaged marinades. Premade or packaged taco seasonings. Relishes. Regular salad dressings. Salsa. Potato and tortilla chips. Corn chips and puffs. Salted popcorn and pretzels. Canned or dried soups. Pizza. Frozen entrees and pot pies. Summary  Eating less sodium can help lower your blood pressure, reduce swelling, and protect your heart, liver, and kidneys.  Most people on this plan should limit their sodium intake to 1,500-2,000 mg (milligrams) of sodium each day.  Canned, boxed, and frozen foods are high in sodium. Restaurant foods, fast foods, and pizza are also very high in sodium. You also get sodium by adding salt to food.  Try to cook at home, eat more fresh fruits and vegetables, and eat less fast food, canned, processed, or prepared foods. This information is not intended to replace advice given to you by your  health care provider. Make sure you discuss any questions you have with your health care provider. Document Released: 06/26/2001 Document Revised: 12/29/2015 Document Reviewed: 12/29/2015 Elsevier  Interactive Patient Education  2017 [employer].      If you need a refill on your cardiac medications before your next appointment, please call your pharmacy.

## 2016-06-28 NOTE — Progress Notes (Signed)
PCP: Mikey Kirschner, MD Primary Cardiologist: none  Anthony [name] is a 61 y.o. male who presents today for routine electrophysiology followup.  Since last being seen in our clinic, the patient reports doing reasonably well.  Though he was in sinus when he went for his cardioversion in early MAY, he thinks that he has been in persistent afib/ atrial flutter since 06/04/16.  He has SOB, fatigue, and unsteadiness.  Today, he denies symptoms of palpitations, chest pain, lower extremity edema, dizziness, presyncope, or syncope.  The patient is otherwise without complaint today.   Past Medical History:  Diagnosis Date  . Anxiety   . Back pain   . Hyperlipidemia   . Hypertension   . Insomnia   . Migraine headache   . Persistent atrial fibrillation ([place])   . Reflux   . Sinusitis   . Venous stasis    Past Surgical History:  Procedure Laterality Date  . ABDOMINAL SURGERY    . ANKLE SURGERY    . CARDIOVERSION N/A 05/06/2016   Procedure: CARDIOVERSION;  Surgeon: Jerline Pain, MD;  Location: [facility] ENDOSCOPY;  Service: Cardiovascular;  Laterality: N/A;    ROS- all systems are reviewed and negatives except as per HPI above  Current Outpatient Prescriptions  Medication Sig Dispense Refill  . ALPRAZolam (XANAX) 0.5 MG tablet Take 1 tablet (0.5 mg total) by mouth daily as needed for anxiety. 30 tablet 5  . apixaban (ELIQUIS) 5 MG TABS tablet Take 1 tablet (5 mg total) by mouth 2 (two) times daily. 60 tablet 3  . Ascorbic Acid (VITAMIN C) 1000 MG tablet Take 1,000 mg by mouth daily.    Marland Kitchen escitalopram (LEXAPRO) 10 MG tablet Take 1 tablet (10 mg total) by mouth every morning. 30 tablet 5  . ezetimibe (ZETIA) 10 MG tablet Take 1 tablet (10 mg total) by mouth daily. 90 tablet 1  . flecainide (TAMBOCOR) 100 MG tablet Take 1 tablet (100 mg total) by mouth 2 (two) times daily. 180 tablet 3  . lisinopril (PRINIVIL,ZESTRIL) 20 MG tablet Take 1 tablet (20 mg total) by mouth 2 (two) times daily. 60 tablet  5  . loratadine (CLARITIN) 10 MG tablet Take 10 mg by mouth daily.    . metoprolol succinate (TOPROL XL) 25 MG 24 hr tablet Take 1 tablet (25 mg total) by mouth 2 (two) times daily. 180 tablet 2  . Multiple Vitamin (MULTIVITAMIN) tablet Take 1 tablet by mouth daily.    . SUMAtriptan (IMITREX) 50 MG tablet Take 1 tablet at first sign of headache, may take 2 tablets 2 hours later if needed 20 tablet 5  . verapamil (VERELAN PM) 240 MG 24 hr capsule Take 1 capsule (240 mg total) by mouth daily. 30 capsule 5  . vitamin E 400 UNIT capsule Take 400 Units by mouth daily.    Marland Kitchen zolpidem (AMBIEN) 10 MG tablet Take 1 tablet (10 mg total) by mouth at bedtime. 30 tablet 5   No current facility-administered medications for this visit.     Physical Exam: Vitals:   06/28/16 0905  BP: (!) 148/102  Pulse: 79  SpO2: 95%  Weight: 208 lb 3.2 oz (94.4 kg)  Height: 5\' 11"  (1.803 m)    GEN- The patient is well appearing, alert and oriented x 3 today.   Head- normocephalic, atraumatic Eyes-  Sclera clear, conjunctiva pink Ears- hearing intact Oropharynx- clear Lungs- Clear to ausculation bilaterally, normal work of breathing Heart- irregular rate and rhythm, no murmurs, rubs  or gallops, PMI not laterally displaced GI- soft, NT, ND, + BS Extremities- no clubbing, cyanosis, or edema  EKG tracing ordered today is personally reviewed and shows atrial flutter, V rate 79 bpm, LBBB, QTc 488 msec  Assessment and Plan:  1. Persistent atrial fibrillation/ typical appearing atrial flutter The patient has symptomatic atrial arrhythmias.  He has failed medical therapy with flecainide.  I will stop flecainide today. Therapeutic strategies for afib and atrial flutter including medicine and ablation were discussed in detail with the patient today.  Though we could consider tikosyn or sotalol, I worry that his QT is marginal for these currently.   Risk, benefits, and alternatives to EP study and radiofrequency ablation  were also discussed in detail today. These risks include but are not limited to stroke, bleeding, vascular damage, tamponade, perforation, damage to the esophagus, lungs, and other structures, pulmonary vein stenosis, worsening renal function, and death. The patient understands these risk and wishes to think about this further with his spouse.  I have advised ablation.  If he decides to proceed, he will contact our office and we will schedule TEE and afib ablation at the next available time.  Continue eliquis. If he does not proceed with ablation, he will follow-up in the AF clinic in 3 months  2. Hypertensive cardiovascular disease BP elevated here today and also at home 2 gram sodium diet Start hctz 12.5mg  daily today bmet prior to ablation or upon follow-up in AF clinic  3. OSA Awaiting CPAP titration study with Dr Irving Copas MD, [facility] 06/28/2016 9:24 AM

## 2016-06-29 ENCOUNTER — Telehealth: Payer: Self-pay | Admitting: Internal Medicine

## 2016-06-29 NOTE — Telephone Encounter (Signed)
New message   Pt verbalized that he is calling for rn to schedule ablation anytime  He wants it as soon as possible

## 2016-06-29 NOTE — Telephone Encounter (Signed)
Pt calling to schedule A Fib ablation. Pt states July 10 would be a good day for him if this works with Dr Jackalyn Lombard schedule. Pt knows to expect a call from [place] tomorrow to get ablation scheduled.

## 2016-06-30 NOTE — Telephone Encounter (Signed)
Returned call to patient and let him know the details NPO after MN Report to [employer] at 7:30am Do not take any medications the morning of the procedure Plan for one night stay Will need someone to drive you home after discharge   He is aware of above and will be bringing in [facility] paper work.  I let him know to drop off out front and that he could tell his employer he would be out for a week.  He can expect to return on 08/04/16.  He appreciated my call.

## 2016-06-30 NOTE — Telephone Encounter (Signed)
afib ablation 07/27/16--second case.  To be at the hospital at 7:30am for TEE at 9:00am.  I have left him a message to return my call to discuss details

## 2016-06-30 NOTE — Telephone Encounter (Signed)
Follow Up:      Returning your call from today. 

## 2016-07-02 ENCOUNTER — Other Ambulatory Visit: Payer: Self-pay | Admitting: Family Medicine

## 2016-07-02 NOTE — Telephone Encounter (Signed)
Nurses please call the pharmacy let them know that this medication is discontinued make sure it is off of the Epic med list. I don't trust the electronic message as a way of telling the pharmacy we have discontinued it this medication could be dangerous for this patient if somehow it got refilled he is on a blood thinner

## 2016-07-02 NOTE — Telephone Encounter (Signed)
Last seen 04/21/16

## 2016-07-05 ENCOUNTER — Telehealth: Payer: Self-pay | Admitting: Internal Medicine

## 2016-07-05 NOTE — Telephone Encounter (Signed)
New message    Santiago Glad from [facility] is calling asking for surgery codes for pt.

## 2016-07-06 NOTE — Telephone Encounter (Signed)
lmom for Anthony [name].  ? For which procedure.  He is scheduled for  A [id number] and [id number].  Left my direct line for any questions

## 2016-07-07 ENCOUNTER — Telehealth: Payer: Self-pay | Admitting: Cardiology

## 2016-07-07 NOTE — Telephone Encounter (Signed)
Patient states that he is returning your call. Thanks.

## 2016-07-07 NOTE — Telephone Encounter (Signed)
Patient wanted his sleep study moved up from August 10 2016 Per his doctor because he has a surgery scheduled for July 27 2016.  Sleep study moved up to July 08 2016. Patient notified and agrees to the new sleep study time.

## 2016-07-07 NOTE — Telephone Encounter (Signed)
New message    Pt is calling returning call to [place].

## 2016-07-07 NOTE — Telephone Encounter (Signed)
New Message   pt verbalized that she is returning call for rn  About a quicker appt for sleep study

## 2016-07-08 ENCOUNTER — Ambulatory Visit (HOSPITAL_BASED_OUTPATIENT_CLINIC_OR_DEPARTMENT_OTHER): Payer: 59 | Attending: Cardiology | Admitting: Cardiology

## 2016-07-08 VITALS — Ht 71.0 in | Wt 203.0 lb

## 2016-07-08 DIAGNOSIS — I4892 Unspecified atrial flutter: Secondary | ICD-10-CM | POA: Diagnosis not present

## 2016-07-08 DIAGNOSIS — G4733 Obstructive sleep apnea (adult) (pediatric): Secondary | ICD-10-CM

## 2016-07-17 NOTE — Procedures (Signed)
   Patient Name: Anthony [name], Anthony [name] Date: 07/08/2016 Gender: Male D.O.B: April 28, 1955 Age (years): 60 Referring Provider: Sherran Needs Height (inches): 71 Interpreting Physician: Fransico Him MD, ABSM Weight (lbs): 203 RPSGT: Zadie Rhine BMI: 28 MRN: [id number] Neck Size: 17.00  CLINICAL INFORMATION The patient is referred for a CPAP titration to treat sleep apnea. Date of NPSG, Split Night or HST:06/06/2016  SLEEP STUDY TECHNIQUE As per the AASM Manual for the Scoring of Sleep and Associated Events v2.3 (April 2016) with a hypopnea requiring 4% desaturations.  The channels recorded and monitored were frontal, central and occipital EEG, electrooculogram (EOG), submentalis EMG (chin), nasal and oral airflow, thoracic and abdominal wall motion, anterior tibialis EMG, snore microphone, electrocardiogram, and pulse oximetry. Continuous positive airway pressure (CPAP) was initiated at the beginning of the study and titrated to treat sleep-disordered breathing.  MEDICATIONS Medications self-administered by patient taken the night of the study : [place] Comments added by technician: ONE RESTROOM VISTED  Comments added by scorer: N/A  RESPIRATORY PARAMETERS Optimal PAP Pressure (cm): 7  AHI at Optimal Pressure (/hr):0.0 Overall Minimal O2 (%):91.00  Supine % at Optimal Pressure (%):9 Minimal O2 at Optimal Pressure (%): 91.0    SLEEP ARCHITECTURE The study was initiated at 9:13:00 PM and ended at 4:01:17 AM.  Sleep onset time was 5.5 minutes and the sleep efficiency was 92.1%. The total sleep time was 376.0 minutes.  The patient spent 1.60% of the night in stage N1 sleep, 52.79% in stage N2 sleep, 29.39% in stage N3 and 16.22% in REM.Stage REM latency was 194.0 minutes  Wake after sleep onset was 26.8. Alpha intrusion was absent. Supine sleep was 22.07%.  CARDIAC DATA The 2 lead EKG demonstrated atrial flutter . The mean heart rate was 94.47 beats per  minute.   LEG MOVEMENT DATA The total Periodic Limb Movements of Sleep (PLMS) were 272. The PLMS index was 43.40. A PLMS index of <15 is considered normal in adults.  IMPRESSIONS - The optimal PAP pressure was 7 cm of water. - Central sleep apnea was not noted during this titration (CAI = 0.0/h). - Significant oxygen desaturations were not observed during this titration (min O2 = 91.00%). - No snoring was audible during this study. - 2-lead EKG demonstrated: Atrial Flutter - Moderate periodic limb movements were observed during this study. Arousals associated with PLMs were rare.  DIAGNOSIS - Obstructive Sleep Apnea (327.23 [G47.33 ICD-10]) - Atrial Flutter  RECOMMENDATIONS - Trial of CPAP therapy on 7 cm H2O with a Medium size Fisher&Paykel Nasal Mask Eson2 mask and heated humidification. - Avoid alcohol, sedatives and other CNS depressants that may worsen sleep apnea and disrupt normal sleep architecture. - Sleep hygiene should be reviewed to assess factors that may improve sleep quality. - Weight management and regular exercise should be initiated or continued. - Return to Sleep Center for re-evaluation after 10 weeks of therapy   [place], [facility]  ELECTRONICALLY SIGNED ON:  07/17/2016, 11:28 PM [place] PH: [phone]   FX: [phone] [place]

## 2016-07-20 ENCOUNTER — Telehealth: Payer: Self-pay | Admitting: *Deleted

## 2016-07-20 NOTE — Telephone Encounter (Signed)
-----   Message from Sueanne Margarita, MD sent at 07/17/2016 11:34 PM EDT ----- Please let patient know that they have significant sleep apnea and had successful CPAP titration and will be set up with CPAP unit.  Please let DME know that order is in EPIC.  Please set patient up for OV in 10 weeks

## 2016-07-20 NOTE — Telephone Encounter (Signed)
Informed patient of sleep study results and patient understanding was verbalized. Patient understands he will be contacted by [facility] to set up his cpap. He understands to call if [facility] does not contact him with new setup in a timely manner. He understands he will be called once confirmation has been received from [facility] that he has received his new machine to schedule 10 week follow up appointment.  Anthony [name] notified of new cpap order in epic Please add to Anthony [name] He was grateful for the call and thanked me

## 2016-07-20 NOTE — Telephone Encounter (Signed)
Patient called to say he received a letter from his insurance stating his sleep study was not covered. Gae Bon (cpap assist.) called the insurance company to inquire about the letter the patient received.  Call reference number is [id number]. Lei ,David(Representative) is who  I spoke to. Sleep study was approved 07/06/2016-10/04/2016. Sharyne Peach confirmed that the sleep study was approved and she states she has no record of a letter sent to the patient stating he was not approved. Call patient and notified him and he states he will call his insurance company as well.

## 2016-07-23 DIAGNOSIS — G4733 Obstructive sleep apnea (adult) (pediatric): Secondary | ICD-10-CM | POA: Diagnosis not present

## 2016-07-27 ENCOUNTER — Encounter (HOSPITAL_COMMUNITY): Payer: Self-pay | Admitting: *Deleted

## 2016-07-27 ENCOUNTER — Ambulatory Visit (HOSPITAL_COMMUNITY)
Admission: RE | Admit: 2016-07-27 | Discharge: 2016-07-28 | Disposition: A | Discharge status: Home / Self Care | Payer: 59 | Source: Ambulatory Visit | Attending: Internal Medicine | Admitting: Internal Medicine

## 2016-07-27 ENCOUNTER — Encounter (HOSPITAL_COMMUNITY)
Admission: RE | Disposition: A | Discharge status: Home / Self Care | Payer: Self-pay | Source: Ambulatory Visit | Attending: Internal Medicine

## 2016-07-27 ENCOUNTER — Ambulatory Visit (HOSPITAL_COMMUNITY): Payer: 59 | Admitting: Certified Registered Nurse Anesthetist

## 2016-07-27 ENCOUNTER — Ambulatory Visit (HOSPITAL_BASED_OUTPATIENT_CLINIC_OR_DEPARTMENT_OTHER)
Admission: RE | Admit: 2016-07-27 | Discharge: 2016-07-27 | Disposition: A | Discharge status: Home / Self Care | Payer: 59 | Source: Ambulatory Visit | Attending: Nurse Practitioner | Admitting: Nurse Practitioner

## 2016-07-27 DIAGNOSIS — E785 Hyperlipidemia, unspecified: Secondary | ICD-10-CM | POA: Insufficient documentation

## 2016-07-27 DIAGNOSIS — G43909 Migraine, unspecified, not intractable, without status migrainosus: Secondary | ICD-10-CM | POA: Diagnosis not present

## 2016-07-27 DIAGNOSIS — I483 Typical atrial flutter: Secondary | ICD-10-CM | POA: Diagnosis not present

## 2016-07-27 DIAGNOSIS — Z7901 Long term (current) use of anticoagulants: Secondary | ICD-10-CM | POA: Insufficient documentation

## 2016-07-27 DIAGNOSIS — I4891 Unspecified atrial fibrillation: Secondary | ICD-10-CM | POA: Diagnosis not present

## 2016-07-27 DIAGNOSIS — K219 Gastro-esophageal reflux disease without esophagitis: Secondary | ICD-10-CM | POA: Insufficient documentation

## 2016-07-27 DIAGNOSIS — G4733 Obstructive sleep apnea (adult) (pediatric): Secondary | ICD-10-CM | POA: Diagnosis not present

## 2016-07-27 DIAGNOSIS — F329 Major depressive disorder, single episode, unspecified: Secondary | ICD-10-CM | POA: Insufficient documentation

## 2016-07-27 DIAGNOSIS — G4700 Insomnia, unspecified: Secondary | ICD-10-CM | POA: Diagnosis not present

## 2016-07-27 DIAGNOSIS — I10 Essential (primary) hypertension: Secondary | ICD-10-CM | POA: Diagnosis not present

## 2016-07-27 DIAGNOSIS — F419 Anxiety disorder, unspecified: Secondary | ICD-10-CM | POA: Diagnosis not present

## 2016-07-27 DIAGNOSIS — Q211 Atrial septal defect: Secondary | ICD-10-CM | POA: Insufficient documentation

## 2016-07-27 DIAGNOSIS — I4892 Unspecified atrial flutter: Secondary | ICD-10-CM | POA: Diagnosis not present

## 2016-07-27 DIAGNOSIS — I340 Nonrheumatic mitral (valve) insufficiency: Secondary | ICD-10-CM | POA: Diagnosis not present

## 2016-07-27 DIAGNOSIS — I119 Hypertensive heart disease without heart failure: Secondary | ICD-10-CM | POA: Insufficient documentation

## 2016-07-27 DIAGNOSIS — I481 Persistent atrial fibrillation: Secondary | ICD-10-CM | POA: Diagnosis not present

## 2016-07-27 HISTORY — DX: Obstructive sleep apnea (adult) (pediatric): Z99.89

## 2016-07-27 HISTORY — DX: Gastro-esophageal reflux disease without esophagitis: K21.9

## 2016-07-27 HISTORY — DX: Dorsalgia, unspecified: M54.9

## 2016-07-27 HISTORY — PX: ATRIAL FIBRILLATION ABLATION: EP1191

## 2016-07-27 HISTORY — DX: Other chronic pain: G89.29

## 2016-07-27 HISTORY — DX: Personal history of urinary calculi: Z87.442

## 2016-07-27 HISTORY — DX: Obstructive sleep apnea (adult) (pediatric): G47.33

## 2016-07-27 HISTORY — DX: Headache, unspecified: R51.9

## 2016-07-27 HISTORY — PX: TEE WITHOUT CARDIOVERSION: SHX5443

## 2016-07-27 HISTORY — DX: Headache: R51

## 2016-07-27 HISTORY — DX: Scoliosis, unspecified: M41.9

## 2016-07-27 LAB — CBC
HCT: 46.4 % (ref 39.0–52.0)
HEMOGLOBIN: 15.8 g/dL (ref 13.0–17.0)
MCH: 30.6 pg (ref 26.0–34.0)
MCHC: 34.1 g/dL (ref 30.0–36.0)
MCV: 89.7 fL (ref 78.0–100.0)
PLATELETS: 168 10*3/uL (ref 150–400)
RBC: 5.17 MIL/uL (ref 4.22–5.81)
RDW: 12.6 % (ref 11.5–15.5)
WBC: 6.5 10*3/uL (ref 4.0–10.5)

## 2016-07-27 LAB — BASIC METABOLIC PANEL
ANION GAP: 10 (ref 5–15)
BUN: 9 mg/dL (ref 6–20)
CHLORIDE: 106 mmol/L (ref 101–111)
CO2: 26 mmol/L (ref 22–32)
Calcium: 9.3 mg/dL (ref 8.9–10.3)
Creatinine, Ser: 1.15 mg/dL (ref 0.61–1.24)
GFR calc Af Amer: >60 mL/min (ref >60)
GLUCOSE: 99 mg/dL (ref 65–99)
POTASSIUM: 4 mmol/L (ref 3.5–5.1)
Sodium: 142 mmol/L (ref 135–145)

## 2016-07-27 LAB — POCT ACTIVATED CLOTTING TIME
ACTIVATED CLOTTING TIME: 186 s
ACTIVATED CLOTTING TIME: 202 s
Activated Clotting Time: 268 seconds
Activated Clotting Time: 307 seconds
Activated Clotting Time: 345 seconds

## 2016-07-27 SURGERY — ATRIAL FIBRILLATION ABLATION
Anesthesia: Monitor Anesthesia Care

## 2016-07-27 SURGERY — ECHOCARDIOGRAM, TRANSESOPHAGEAL
Anesthesia: Moderate Sedation

## 2016-07-27 MED ORDER — HEPARIN SODIUM (PORCINE) 1000 UNIT/ML IJ SOLN
INTRAMUSCULAR | Status: AC
Start: 2016-07-27 — End: ?
  Filled 2016-07-27: qty 1

## 2016-07-27 MED ORDER — BUTAMBEN-TETRACAINE-BENZOCAINE 2-2-14 % EX AERO
INHALATION_SPRAY | CUTANEOUS | Status: DC | PRN
  Administered 2016-07-27: 2 via TOPICAL

## 2016-07-27 MED ORDER — BUPIVACAINE HCL (PF) 0.25 % IJ SOLN
INTRAMUSCULAR | Status: AC
  Filled 2016-07-27: qty 30

## 2016-07-27 MED ORDER — PROTAMINE SULFATE 10 MG/ML IV SOLN
INTRAVENOUS | Status: DC | PRN
  Administered 2016-07-27 (×3): 10 mg via INTRAVENOUS

## 2016-07-27 MED ORDER — BUPIVACAINE HCL (PF) 0.25 % IJ SOLN
INTRAMUSCULAR | Status: DC | PRN
  Administered 2016-07-27: 20 mL

## 2016-07-27 MED ORDER — FENTANYL CITRATE (PF) 100 MCG/2ML IJ SOLN
INTRAMUSCULAR | Status: AC
  Filled 2016-07-27: qty 2

## 2016-07-27 MED ORDER — LIDOCAINE VISCOUS 2 % MT SOLN
OROMUCOSAL | Status: DC | PRN
  Administered 2016-07-27: 15 mL via OROMUCOSAL

## 2016-07-27 MED ORDER — SODIUM CHLORIDE 0.9 % IV SOLN
INTRAVENOUS | Status: DC | PRN
  Administered 2016-07-27 (×2): via INTRAVENOUS

## 2016-07-27 MED ORDER — DEXAMETHASONE SODIUM PHOSPHATE 10 MG/ML IJ SOLN
INTRAMUSCULAR | Status: DC | PRN
  Administered 2016-07-27: 10 mg via INTRAVENOUS

## 2016-07-27 MED ORDER — PROPOFOL 10 MG/ML IV BOLUS
INTRAVENOUS | Status: DC | PRN
  Administered 2016-07-27: 150 mg via INTRAVENOUS
  Administered 2016-07-27: 40 mg via INTRAVENOUS

## 2016-07-27 MED ORDER — ONDANSETRON HCL 4 MG/2ML IJ SOLN: 4.0000 mg | Freq: Four times a day (QID) | INTRAMUSCULAR | Status: DC | PRN

## 2016-07-27 MED ORDER — HEPARIN (PORCINE) IN NACL 2-0.9 UNIT/ML-% IJ SOLN
INTRAMUSCULAR | Status: AC
  Filled 2016-07-27: qty 500

## 2016-07-27 MED ORDER — PHENYLEPHRINE HCL 10 MG/ML IJ SOLN
INTRAVENOUS | Status: DC | PRN
  Administered 2016-07-27: 40 ug/min via INTRAVENOUS

## 2016-07-27 MED ORDER — METOPROLOL SUCCINATE ER 25 MG PO TB24
25.0000 mg | ORAL_TABLET | Freq: Two times a day (BID) | ORAL | Status: DC
  Administered 2016-07-28: 09:00:00 25 mg via ORAL
  Filled 2016-07-27: qty 1

## 2016-07-27 MED ORDER — MIDAZOLAM HCL 5 MG/5ML IJ SOLN
INTRAMUSCULAR | Status: DC | PRN
  Administered 2016-07-27: 2 mg via INTRAVENOUS

## 2016-07-27 MED ORDER — ZOLPIDEM TARTRATE 5 MG PO TABS: 10.0000 mg | ORAL_TABLET | Freq: Every evening | ORAL | Status: DC | PRN

## 2016-07-27 MED ORDER — VERAPAMIL HCL ER 240 MG PO TBCR
240.0000 mg | EXTENDED_RELEASE_TABLET | Freq: Every day | ORAL | Status: DC
  Administered 2016-07-28: 240 mg via ORAL
  Filled 2016-07-27: qty 1

## 2016-07-27 MED ORDER — SODIUM CHLORIDE 0.9 % IV SOLN: 250.0000 mL | INTRAVENOUS | Status: DC | PRN

## 2016-07-27 MED ORDER — SODIUM CHLORIDE 0.9 % IV SOLN
INTRAVENOUS | Status: DC
  Administered 2016-07-27: 08:00:00 via INTRAVENOUS

## 2016-07-27 MED ORDER — LISINOPRIL 10 MG PO TABS
20.0000 mg | ORAL_TABLET | Freq: Two times a day (BID) | ORAL | Status: DC
  Administered 2016-07-28: 20 mg via ORAL
  Filled 2016-07-27: qty 2

## 2016-07-27 MED ORDER — SODIUM CHLORIDE 0.9 % IV SOLN: INTRAVENOUS | Status: DC

## 2016-07-27 MED ORDER — MIDAZOLAM HCL 10 MG/2ML IJ SOLN
INTRAMUSCULAR | Status: DC | PRN
  Administered 2016-07-27: 1 mg via INTRAVENOUS
  Administered 2016-07-27: 2 mg via INTRAVENOUS
  Administered 2016-07-27: 1 mg via INTRAVENOUS
  Administered 2016-07-27: 2 mg via INTRAVENOUS

## 2016-07-27 MED ORDER — ONDANSETRON HCL 4 MG/2ML IJ SOLN
INTRAMUSCULAR | Status: DC | PRN
  Administered 2016-07-27: 4 mg via INTRAVENOUS

## 2016-07-27 MED ORDER — FENTANYL CITRATE (PF) 100 MCG/2ML IJ SOLN
INTRAMUSCULAR | Status: DC | PRN
  Administered 2016-07-27 (×4): 25 ug via INTRAVENOUS

## 2016-07-27 MED ORDER — HEPARIN SODIUM (PORCINE) 1000 UNIT/ML IJ SOLN
INTRAMUSCULAR | Status: DC | PRN
  Administered 2016-07-27: 4000 [IU] via INTRAVENOUS
  Administered 2016-07-27: 3000 [IU] via INTRAVENOUS
  Administered 2016-07-27: 1000 [IU] via INTRAVENOUS

## 2016-07-27 MED ORDER — HYDROCHLOROTHIAZIDE 25 MG PO TABS
12.5000 mg | ORAL_TABLET | Freq: Every day | ORAL | Status: DC
  Administered 2016-07-28: 12.5 mg via ORAL
  Filled 2016-07-27: qty 1

## 2016-07-27 MED ORDER — MIDAZOLAM HCL 5 MG/ML IJ SOLN
INTRAMUSCULAR | Status: AC
  Filled 2016-07-27: qty 2

## 2016-07-27 MED ORDER — HEPARIN (PORCINE) IN NACL 2-0.9 UNIT/ML-% IJ SOLN
INTRAMUSCULAR | Status: AC | PRN
  Administered 2016-07-27: 500 mL

## 2016-07-27 MED ORDER — ESCITALOPRAM OXALATE 10 MG PO TABS
10.0000 mg | ORAL_TABLET | Freq: Every day | ORAL | Status: DC
  Administered 2016-07-28: 10 mg via ORAL
  Filled 2016-07-27: qty 1

## 2016-07-27 MED ORDER — SODIUM CHLORIDE 0.9% FLUSH
3.0000 mL | Freq: Two times a day (BID) | INTRAVENOUS | Status: DC
  Administered 2016-07-27 – 2016-07-28 (×2): 3 mL via INTRAVENOUS

## 2016-07-27 MED ORDER — ACETAMINOPHEN 325 MG PO TABS: 650.0000 mg | ORAL_TABLET | ORAL | Status: DC | PRN

## 2016-07-27 MED ORDER — METOPROLOL TARTRATE 5 MG/5ML IV SOLN
INTRAVENOUS | Status: AC
  Filled 2016-07-27: qty 5

## 2016-07-27 MED ORDER — HEPARIN SODIUM (PORCINE) 1000 UNIT/ML IJ SOLN
INTRAMUSCULAR | Status: DC | PRN
  Administered 2016-07-27 (×3): 1000 [IU] via INTRAVENOUS
  Administered 2016-07-27: 12000 [IU] via INTRAVENOUS

## 2016-07-27 MED ORDER — OFF THE BEAT BOOK
Freq: Once | Status: AC
  Administered 2016-07-27: 21:00:00
  Filled 2016-07-27: qty 1

## 2016-07-27 MED ORDER — FENTANYL CITRATE (PF) 100 MCG/2ML IJ SOLN
INTRAMUSCULAR | Status: DC | PRN
  Administered 2016-07-27 (×3): 25 ug via INTRAVENOUS

## 2016-07-27 MED ORDER — GLYCOPYRROLATE 0.2 MG/ML IJ SOLN
INTRAMUSCULAR | Status: DC | PRN
  Administered 2016-07-27: 0.2 mg via INTRAVENOUS

## 2016-07-27 MED ORDER — SODIUM CHLORIDE 0.9% FLUSH: 3.0000 mL | INTRAVENOUS | Status: DC | PRN

## 2016-07-27 MED ORDER — HYDROCODONE-ACETAMINOPHEN 5-325 MG PO TABS: 1.0000 | ORAL_TABLET | ORAL | Status: DC | PRN

## 2016-07-27 MED ORDER — LIDOCAINE VISCOUS 2 % MT SOLN
OROMUCOSAL | Status: AC
  Filled 2016-07-27: qty 15

## 2016-07-27 MED ORDER — PHENYLEPHRINE HCL 10 MG/ML IJ SOLN
INTRAMUSCULAR | Status: DC | PRN
  Administered 2016-07-27 (×4): 80 ug via INTRAVENOUS

## 2016-07-27 MED ORDER — APIXABAN 5 MG PO TABS
5.0000 mg | ORAL_TABLET | Freq: Two times a day (BID) | ORAL | Status: DC
  Administered 2016-07-27 – 2016-07-28 (×2): 5 mg via ORAL
  Filled 2016-07-27 (×2): qty 1

## 2016-07-27 SURGICAL SUPPLY — 17 items
BLANKET WARM UNDERBOD FULL ACC (MISCELLANEOUS) ×2 IMPLANT
CATH MAPPNG PENTARAY F 2-6-2MM (CATHETERS) ×1 IMPLANT
CATH NAVISTAR SMARTTOUCH DF (ABLATOR) ×2 IMPLANT
CATH SOUNDSTAR 3D IMAGING (CATHETERS) ×2 IMPLANT
CATH WEBSTER BI DIR CS D-F CRV (CATHETERS) ×2 IMPLANT
COVER SWIFTLINK CONNECTOR (BAG) ×2 IMPLANT
NEEDLE TRANSEP BRK 71CM 407200 (NEEDLE) ×2 IMPLANT
PACK EP LATEX FREE (CUSTOM PROCEDURE TRAY) ×1
PACK EP LF (CUSTOM PROCEDURE TRAY) ×1 IMPLANT
PAD DEFIB LIFELINK (PAD) ×2 IMPLANT
PATCH CARTO3 (PAD) ×2 IMPLANT
PENTARAY F 2-6-2MM (CATHETERS) ×2
SHEATH AVANTI 11F 11CM (SHEATH) ×2 IMPLANT
SHEATH PINNACLE 7F 10CM (SHEATH) ×4 IMPLANT
SHEATH PINNACLE 9F 10CM (SHEATH) ×2 IMPLANT
SHEATH SWARTZ TS SL2 63CM 8.5F (SHEATH) ×2 IMPLANT
TUBING SMART ABLATE COOLFLOW (TUBING) ×2 IMPLANT

## 2016-07-27 NOTE — Anesthesia Postprocedure Evaluation (Signed)
Anesthesia Post Note  Patient: [name]  Procedure(s) Performed: Procedure(s) (LRB): Atrial Fibrillation Ablation (N/A)     Patient location during evaluation: Cath Lab Anesthesia Type: General Level of consciousness: awake, awake and alert and oriented Pain management: pain level controlled Vital Signs Assessment: post-procedure vital signs reviewed and stable Respiratory status: spontaneous breathing, nonlabored ventilation and respiratory function stable Cardiovascular status: blood pressure returned to baseline Anesthetic complications: no    Last Vitals:  Vitals:   07/27/16 1555 07/27/16 1600  BP: 130/82 120/72  Pulse: 86 87  Resp: 12 16  Temp:      Last Pain:  Vitals:   07/27/16 1550  TempSrc: Temporal  PainSc:                  Dominiq Fontaine COKER

## 2016-07-27 NOTE — Transfer of Care (Signed)
Immediate Anesthesia Transfer of Care Note  Patient: Anthony [name]  Procedure(s) Performed: Procedure(s): Atrial Fibrillation Ablation (N/A)  Patient Location: Cath Lab  Anesthesia Type:General  Level of Consciousness: awake and [name]   Airway & Oxygen Therapy: Patient Spontanous Breathing and Patient connected to nasal cannula oxygen  Post-op Assessment: Report given to RN and Post -op Vital signs reviewed and stable  Post vital signs: Reviewed and stable  Last Vitals:  Vitals:   07/27/16 0955 07/27/16 1445  BP: (!) 156/85   Pulse: 62   Resp: 13   Temp:  (!) 36.1 C    Last Pain:  Vitals:   07/27/16 1445  TempSrc: Temporal  PainSc: Asleep         Complications: No apparent anesthesia complications

## 2016-07-27 NOTE — H&P (Signed)
   INTERVAL PROCEDURE H&P  History and Physical Interval Note:  07/27/2016 8:25 AM  Anthony [name] has presented today for their planned procedure. The various methods of treatment have been discussed with the patient and family. After consideration of risks, benefits and other options for treatment, the patient has consented to the procedure.  The patients' outpatient history has been reviewed, patient examined, and no change in status from most recent office note within the past 30 days. I have reviewed the patients' chart and labs and will proceed as planned. Questions were answered to the patient's satisfaction.   Pixie Casino, MD, [place]  Attending Cardiologist  Direct Dial: [phone]  Fax: [phone]  Website:  [email or web address]  Anthony [name] 07/27/2016, 8:25 AM

## 2016-07-27 NOTE — Progress Notes (Addendum)
Site area: 3 rt fv sheaths Site Prior to Removal:  Level 0 Pressure Applied For: 30 minutes Manual:   yes Patient Status During Pull:  stable Post Pull Site:  Level 0 Post Pull Instructions Given:  yes Post Pull Pulses Present: palpable Dressing Applied:  Gauze and tegaderm Bedrest begins @ 1610 Comments: IV saline locked

## 2016-07-27 NOTE — H&P (View-Only) (Signed)
PCP: Mikey Kirschner, MD Primary Cardiologist: none  Anthony [name] is a 61 y.o. male who presents today for routine electrophysiology followup.  Since last being seen in our clinic, the patient reports doing reasonably well.  Though he was in sinus when he went for his cardioversion in early MAY, he thinks that he has been in persistent afib/ atrial flutter since 06/04/16.  He has SOB, fatigue, and unsteadiness.  Today, he denies symptoms of palpitations, chest pain, lower extremity edema, dizziness, presyncope, or syncope.  The patient is otherwise without complaint today.   Past Medical History:  Diagnosis Date  . Anxiety   . Back pain   . Hyperlipidemia   . Hypertension   . Insomnia   . Migraine headache   . Persistent atrial fibrillation ([place])   . Reflux   . Sinusitis   . Venous stasis    Past Surgical History:  Procedure Laterality Date  . ABDOMINAL SURGERY    . ANKLE SURGERY    . CARDIOVERSION N/A 05/06/2016   Procedure: CARDIOVERSION;  Surgeon: Jerline Pain, MD;  Location: [facility] ENDOSCOPY;  Service: Cardiovascular;  Laterality: N/A;    ROS- all systems are reviewed and negatives except as per HPI above  Current Outpatient Prescriptions  Medication Sig Dispense Refill  . ALPRAZolam (XANAX) 0.5 MG tablet Take 1 tablet (0.5 mg total) by mouth daily as needed for anxiety. 30 tablet 5  . apixaban (ELIQUIS) 5 MG TABS tablet Take 1 tablet (5 mg total) by mouth 2 (two) times daily. 60 tablet 3  . Ascorbic Acid (VITAMIN C) 1000 MG tablet Take 1,000 mg by mouth daily.    Marland Kitchen escitalopram (LEXAPRO) 10 MG tablet Take 1 tablet (10 mg total) by mouth every morning. 30 tablet 5  . ezetimibe (ZETIA) 10 MG tablet Take 1 tablet (10 mg total) by mouth daily. 90 tablet 1  . flecainide (TAMBOCOR) 100 MG tablet Take 1 tablet (100 mg total) by mouth 2 (two) times daily. 180 tablet 3  . lisinopril (PRINIVIL,ZESTRIL) 20 MG tablet Take 1 tablet (20 mg total) by mouth 2 (two) times daily. 60 tablet  5  . loratadine (CLARITIN) 10 MG tablet Take 10 mg by mouth daily.    . metoprolol succinate (TOPROL XL) 25 MG 24 hr tablet Take 1 tablet (25 mg total) by mouth 2 (two) times daily. 180 tablet 2  . Multiple Vitamin (MULTIVITAMIN) tablet Take 1 tablet by mouth daily.    . SUMAtriptan (IMITREX) 50 MG tablet Take 1 tablet at first sign of headache, may take 2 tablets 2 hours later if needed 20 tablet 5  . verapamil (VERELAN PM) 240 MG 24 hr capsule Take 1 capsule (240 mg total) by mouth daily. 30 capsule 5  . vitamin E 400 UNIT capsule Take 400 Units by mouth daily.    Marland Kitchen zolpidem (AMBIEN) 10 MG tablet Take 1 tablet (10 mg total) by mouth at bedtime. 30 tablet 5   No current facility-administered medications for this visit.     Physical Exam: Vitals:   06/28/16 0905  BP: (!) 148/102  Pulse: 79  SpO2: 95%  Weight: 208 lb 3.2 oz (94.4 kg)  Height: 5\' 11"  (1.803 m)    GEN- The patient is well appearing, alert and oriented x 3 today.   Head- normocephalic, atraumatic Eyes-  Sclera clear, conjunctiva pink Ears- hearing intact Oropharynx- clear Lungs- Clear to ausculation bilaterally, normal work of breathing Heart- irregular rate and rhythm, no murmurs, rubs  or gallops, PMI not laterally displaced GI- soft, NT, ND, + BS Extremities- no clubbing, cyanosis, or edema  EKG tracing ordered today is personally reviewed and shows atrial flutter, V rate 79 bpm, LBBB, QTc 488 msec  Assessment and Plan:  1. Persistent atrial fibrillation/ typical appearing atrial flutter The patient has symptomatic atrial arrhythmias.  He has failed medical therapy with flecainide.  I will stop flecainide today. Therapeutic strategies for afib and atrial flutter including medicine and ablation were discussed in detail with the patient today.  Though we could consider tikosyn or sotalol, I worry that his QT is marginal for these currently.   Risk, benefits, and alternatives to EP study and radiofrequency ablation  were also discussed in detail today. These risks include but are not limited to stroke, bleeding, vascular damage, tamponade, perforation, damage to the esophagus, lungs, and other structures, pulmonary vein stenosis, worsening renal function, and death. The patient understands these risk and wishes to think about this further with his spouse.  I have advised ablation.  If he decides to proceed, he will contact our office and we will schedule TEE and afib ablation at the next available time.  Continue eliquis. If he does not proceed with ablation, he will follow-up in the AF clinic in 3 months  2. Hypertensive cardiovascular disease BP elevated here today and also at home 2 gram sodium diet Start hctz 12.5mg  daily today bmet prior to ablation or upon follow-up in AF clinic  3. OSA Awaiting CPAP titration study with Dr Irving Copas MD, [facility] 06/28/2016 9:24 AM

## 2016-07-27 NOTE — CV Procedure (Signed)
TRANSESOPHAGEAL ECHOCARDIOGRAM (TEE) NOTE  INDICATIONS: atrial flutter, pre-ablation  PROCEDURE:   Informed consent was obtained prior to the procedure. The risks, benefits and alternatives for the procedure were discussed and the patient comprehended these risks.  Risks include, but are not limited to, cough, sore throat, vomiting, nausea, somnolence, esophageal and stomach trauma or perforation, bleeding, low blood pressure, aspiration, pneumonia, infection, trauma to the teeth and death.    After a procedural time-out, the patient was given 6 mg versed and 75 mcg fentanyl for moderate sedation.  The patient's heart rate, blood pressure, and oxygen saturation are monitored continuously during the procedure.The oropharynx was anesthetized 10 cc of topical 1% viscous lidocaine and 2 cetacaine sprays.  The transesophageal probe was inserted in the esophagus and stomach without difficulty and multiple views were obtained.  The patient was kept under observation until the patient left the procedure room.  The period of conscious sedation is 22 minutes, of which I was present face-to-face 100% of this time. The patient left the procedure room in stable condition.   Agitated microbubble saline contrast was administered.  COMPLICATIONS:    There were no immediate complications.  Findings:  1. LEFT VENTRICLE: The left ventricular wall thickness is mildly increased.  The left ventricular cavity is normal in size. Wall motion is normal.  LVEF is 55-60%.  2. RIGHT VENTRICLE:  The right ventricle is normal in structure and function without any thrombus or masses.    3. LEFT ATRIUM:  The left atrium is moderately dilated in size without any thrombus or masses.  There is not spontaneous echo contrast ("smoke") in the left atrium consistent with a low flow state.  4. LEFT ATRIAL APPENDAGE:  The small left atrial appendage is free of any thrombus or masses. The appendage has single lobes. Pulse doppler  indicates moderate flow in the appendage.  5. ATRIAL SEPTUM:  The atrial septum appears intact and is free of thrombus and/or masses.  There is no evidence for interatrial shunting by color doppler and saline microbubble.  6. RIGHT ATRIUM:  The right atrium is normal in size and function without any thrombus or masses.  7. MITRAL VALVE:  The mitral valve is normal in structure and function with Mild regurgitation.  There were no vegetations or stenosis.  8. AORTIC VALVE:  The aortic valve is trileaflet, normal in structure and function with no regurgitation.  There were no vegetations or stenosis  9. TRICUSPID VALVE:  The tricuspid valve is normal in structure and function with trivial regurgitation.  There were no vegetations or stenosis  10.  PULMONIC VALVE:  The pulmonic valve is normal in structure and function with no regurgitation.  There were no vegetations or stenosis.   11. AORTIC ARCH, ASCENDING AND DESCENDING AORTA:  There was grade 1 Ron Parker et. Al, 1992) atherosclerosis of the distal aortic arch and proximal descending aorta.  12. PULMONARY VEINS: Anomalous pulmonary venous return was not noted.  13. PERICARDIUM: The pericardium appeared normal and non-thickened.  There is no pericardial effusion.  IMPRESSION:   1. No LAA thrombus. 2. Small PFO and associated atrial septal aneurysm with right to left shunting by color doppler and brisk right to left shunting of saline microbubble contrast without provocation. 3. Moderate LAE 4. Normal LVEF 55-60%  RECOMMENDATIONS:    1. Ok to proceed with flutter ablation. Given PFO, consider at minimum aspirin long-term for stroke prevention if he eventually is able to come off of anticoagulation.  Time Spent  Directly with the Patient:  45 minutes   Pixie Casino, MD, [place]  Attending Cardiologist  Direct Dial: [phone]  Fax: [phone]  Website:  [email or web address]  [place] 07/27/2016, 9:27 AM

## 2016-07-27 NOTE — Care Management Note (Signed)
Case Management Note  Patient Details  Name: Anthony [name] MRN: [id number] Date of Birth: 07-07-1955  Subjective/Objective:   S/p afib ablation, he has been on eliquis already at home per Home med list.                   Action/Plan: NCM will follow for dc needs.   Expected Discharge Date:                  Expected Discharge Plan:     In-House Referral:     Discharge planning Services  CM Consult  Post Acute Care Choice:    Choice offered to:     DME Arranged:    DME Agency:     HH Arranged:    HH Agency:     Status of Service:  In process, will continue to follow  If discussed at Long Length of Stay Meetings, dates discussed:    Additional Comments:  Zenon Mayo, RN 07/27/2016, 10:10 PM

## 2016-07-27 NOTE — Interval H&P Note (Signed)
History and Physical Interval Note:  07/27/2016 8:20 AM  Anthony [name]  has presented today for surgery, with the diagnosis of afib  The various methods of treatment have been discussed with the patient and family. After consideration of risks, benefits and other options for treatment, the patient has consented to  Procedure(s): Atrial Fibrillation Ablation (N/A) as a surgical intervention .  The patient's history has been reviewed, patient examined, no change in status, stable for surgery.  I have reviewed the patient's chart and labs.  Questions were answered to the patient's satisfaction.     Thompson Grayer

## 2016-07-27 NOTE — Anesthesia Preprocedure Evaluation (Signed)
Anesthesia Evaluation  Patient identified by MRN, date of birth, ID band Patient awake    Reviewed: Allergy & Precautions, NPO status , Patient's Chart, lab work & pertinent test results  History of Anesthesia Complications Negative for: history of anesthetic complications  Airway Mallampati: II  TM Distance: >3 FB Neck ROM: Full    Dental  (+) Teeth Intact   Pulmonary sleep apnea ,    breath sounds clear to auscultation       Cardiovascular hypertension, Pt. on medications + dysrhythmias Atrial Fibrillation  Rhythm:Irregular     Neuro/Psych  Headaches, PSYCHIATRIC DISORDERS Anxiety Depression    GI/Hepatic Neg liver ROS, GERD  ,  Endo/Other  negative endocrine ROS  Renal/GU negative Renal ROS     Musculoskeletal   Abdominal   Peds  Hematology negative hematology ROS (+)   Anesthesia Other Findings   Reproductive/Obstetrics                             Anesthesia Physical Anesthesia Plan  ASA: III  Anesthesia Plan: General   Post-op Pain Management:    Induction: Intravenous  PONV Risk Score and Plan: 2 and Ondansetron and Dexamethasone  Airway Management Planned: LMA  Additional Equipment: None  Intra-op Plan:   Post-operative Plan: Extubation in OR  Informed Consent: I have reviewed the patients History and Physical, chart, labs and discussed the procedure including the risks, benefits and alternatives for the proposed anesthesia with the patient or authorized representative who has indicated his/her understanding and acceptance.   Dental advisory given  Plan Discussed with: CRNA and Surgeon  Anesthesia Plan Comments:         Anesthesia Quick Evaluation

## 2016-07-27 NOTE — Anesthesia Procedure Notes (Signed)
Procedure Name: LMA Insertion Date/Time: 07/27/2016 10:58 AM Performed by: Ollen Bowl Pre-anesthesia Checklist: Patient identified, Emergency Drugs available, Suction available, Patient being monitored and Timeout performed Patient Re-evaluated:Patient Re-evaluated prior to inductionOxygen Delivery Method: Circle system utilized and Simple face mask Preoxygenation: Pre-oxygenation with 100% oxygen Intubation Type: IV induction Ventilation: Mask ventilation without difficulty LMA: LMA inserted LMA Size: 5.0 Number of attempts: 1 Airway Equipment and Method: Patient positioned with wedge pillow Placement Confirmation: positive ETCO2 and breath sounds checked- equal and bilateral Tube secured with: Tape Dental Injury: Teeth and Oropharynx as per pre-operative assessment

## 2016-07-28 ENCOUNTER — Encounter (HOSPITAL_COMMUNITY): Payer: Self-pay | Admitting: Internal Medicine

## 2016-07-28 DIAGNOSIS — I119 Hypertensive heart disease without heart failure: Secondary | ICD-10-CM | POA: Diagnosis not present

## 2016-07-28 DIAGNOSIS — I481 Persistent atrial fibrillation: Secondary | ICD-10-CM

## 2016-07-28 DIAGNOSIS — I483 Typical atrial flutter: Secondary | ICD-10-CM

## 2016-07-28 DIAGNOSIS — Q211 Atrial septal defect: Secondary | ICD-10-CM | POA: Diagnosis not present

## 2016-07-28 MED ORDER — PANTOPRAZOLE SODIUM 40 MG PO TBEC: 40.0000 mg | DELAYED_RELEASE_TABLET | Freq: Every day | ORAL | 0 refills | Status: DC

## 2016-07-28 NOTE — Discharge Summary (Signed)
ELECTROPHYSIOLOGY PROCEDURE DISCHARGE SUMMARY    Patient ID: Anthony [name],  MRN: [id number], DOB/AGE: Mar 05, 1955 61 y.o.  Admit date: 07/27/2016 Discharge date: 07/28/2016  Primary Care Physician: Mikey Kirschner, MD  Primary Cardiologist/Electrophysiologist: Thompson Grayer, MD  Primary Discharge Diagnosis:  1. Persistent AFib 2. AFlutter     CHA2DS2Vasc is at least 1, on Eliquis  Secondary Discharge Diagnosis:  1. HTN, hypertensive heart disease  Procedures This Admission:  1.  Electrophysiology study and radiofrequency catheter ablation on 07/27/16 by Dr Thompson Grayer.  This study demonstrated: CONCLUSIONS: 1. Isthmus dependant right atrial flutter upon presentation.  2. Atrial flutter successfully ablated along the Cavo-tricuspid isthmus with complete bidirectional isthmus block achieved 3. Successful electrical isolation and anatomical encircling of all four pulmonary veins with radiofrequency current. 4. No inducible arrhythmias following ablation 5. No early apparent complications.    Brief HPI: Anthony [name] is a 61 y.o. male with a history of persistent atrial fibrillation, AFlutter.  He has failed medical therapy with flecainide. Risks, benefits, and alternatives to catheter ablation of atrial fibrillation/flutter were reviewed with the patient who wished to proceed.  The patient underwent TEE prior to the procedure which demonstrated normal LV function and no LAA thrombus, incidentally noting a small PFO    Hospital Course:  The patient was admitted and underwent EPS/RFCA of atrial fibrillation with details as outlined above.  They were monitored on telemetry overnight which demonstrated SR.  Right Groin (procedure site) is without complication on the day of discharge.  The patient was examined by Dr. Rayann Heman and considered to be stable for discharge.  Wound care and restrictions were reviewed with the patient.  The patient will be seen back by Roderic Palau, NP in 4  weeks and Dr Rayann Heman in 12 weeks for post ablation follow up. We will Rx 45 days of PPI for the patient.     Physical Exam: Vitals:   07/27/16 1640 07/27/16 2000 07/28/16 0354 07/28/16 0821  BP: (!) 123/58 (!) 153/88 (!) 167/73 (!) 177/80  Pulse:  81 76 88  Resp:  14 16 (!) 25  Temp: 98.8 F (37.1 C) 98.1 F (36.7 C) (!) 97.4 F (36.3 C) 97.9 F (36.6 C)  TempSrc: Oral Oral Oral Oral  SpO2:  97% 95% 96%  Weight:   198 lb 6.6 oz (90 kg)   Height:        GEN- The patient is well appearing, alert and oriented x 3 today.   HEENT: normocephalic, atraumatic; sclera clear, conjunctiva pink; hearing intact; oropharynx clear; neck supple  Lungs- CTA b/l, normal work of breathing.  No wheezes, rales, rhonchi Heart- RRR, no murmurs, rubs or gallops  GI- soft, non-tender, non-distended  Extremities- no clubbing, cyanosis, or edema; DP/PT/radial pulses 2+ bilaterally, right groin is without hematoma/bruit MS- no significant deformity or atrophy Skin- warm and dry, no rash or lesion Psych- euthymic mood, full affect Neuro- strength and sensation are intact   Labs:   Lab Results  Component Value Date   WBC 6.5 07/27/2016   HGB 15.8 07/27/2016   HCT 46.4 07/27/2016   MCV 89.7 07/27/2016   PLT 168 07/27/2016     Recent Labs Lab 07/27/16 0750  NA 142  K 4.0  CL 106  CO2 26  BUN 9  CREATININE 1.15  CALCIUM 9.3  GLUCOSE 99     Discharge Medications:  Allergies as of 07/28/2016      Reactions   Shellfish Allergy Anaphylaxis, Nausea And  Vomiting   Apple Swelling   Asa [aspirin] Swelling   Facial Area   Codeine Other (See Comments)   Nightmares   Contrast Media [iodinated Diagnostic Agents] Hives   Needs pre meds   Isovue [iopamidol] Hives   Needs pre meds   Lipitor [atorvastatin] Other (See Comments)   Aches and pains   Nsaids Other (See Comments)   Told not to take    Zocor [simvastatin] Other (See Comments)   Aches   Acyclovir And Related Palpitations        Medication List    TAKE these medications   ALPRAZolam 0.5 MG tablet Commonly known as:  XANAX Take 1 tablet (0.5 mg total) by mouth daily as needed for anxiety.   apixaban 5 MG Tabs tablet Commonly known as:  ELIQUIS Take 1 tablet (5 mg total) by mouth 2 (two) times daily.   escitalopram 10 MG tablet Commonly known as:  LEXAPRO Take 1 tablet (10 mg total) by mouth every morning.   ezetimibe 10 MG tablet Commonly known as:  ZETIA Take 1 tablet (10 mg total) by mouth daily.   hydrochlorothiazide 25 MG tablet Commonly known as:  HYDRODIURIL Take 0.5 tablets (12.5 mg total) by mouth daily.   lisinopril 20 MG tablet Commonly known as:  PRINIVIL,ZESTRIL Take 1 tablet (20 mg total) by mouth 2 (two) times daily.   loratadine 10 MG tablet Commonly known as:  CLARITIN Take 10 mg by mouth daily.   metoprolol succinate 25 MG 24 hr tablet Commonly known as:  TOPROL XL Take 1 tablet (25 mg total) by mouth 2 (two) times daily.   multivitamin tablet Take 1 tablet by mouth daily.   pantoprazole 40 MG tablet Commonly known as:  PROTONIX Take 1 tablet (40 mg total) by mouth daily.   SUMAtriptan 50 MG tablet Commonly known as:  IMITREX Take 1 tablet at first sign of headache, may take 2 tablets 2 hours later if needed   verapamil 240 MG 24 hr capsule Commonly known as:  VERELAN PM Take 1 capsule (240 mg total) by mouth daily.   vitamin E 400 UNIT capsule Take 400 Units by mouth daily.   zolpidem 10 MG tablet Commonly known as:  AMBIEN Take 1 tablet (10 mg total) by mouth at bedtime. What changed:  when to take this  reasons to take this       Disposition:  Discharge Instructions    Diet - low sodium heart healthy    Complete by:  As directed    Increase activity slowly    Complete by:  As directed      Follow-up Information    [facility] Follow up on 08/27/2016.   Specialty:  Cardiology Why:  9:00AM Contact information: [address] [id number] [place] [phone]       Thompson Grayer, MD Follow up on 10/29/2016.   Specialty:  Cardiology Why:  12:00PM (noon) Contact information: [place] [address] [phone]           Duration of Discharge Encounter: Greater than 30 minutes including physician time.  Signed, Tommye Standard, PA-C 07/28/2016 9:06 AM   I have seen, examined the patient, and reviewed the above assessment and plan.  On exam, RRR. Changes to above are made where necessary.    Co Sign: Thompson Grayer, MD 07/28/2016 4:34 PM

## 2016-07-28 NOTE — Care Management Note (Signed)
Case Management Note  Patient Details  Name: Anthony [name] MRN: [id number] Date of Birth: June 26, 1955  Subjective/Objective: from home with wife,pta indep, S/p afib ablation, he has been on eliquis already at home per Home med list.                                 Action/Plan: Discharge today, no needs.  Expected Discharge Date:  07/28/16               Expected Discharge Plan:  Home/Self Care  In-House Referral:     Discharge planning Services  CM Consult  Post Acute Care Choice:    Choice offered to:     DME Arranged:    DME Agency:     HH Arranged:    HH Agency:     Status of Service:  Completed, signed off  If discussed at [employer] of Stay Meetings, dates discussed:    Additional Comments:  Zenon Mayo, RN 07/28/2016, 9:25 AM

## 2016-07-28 NOTE — Discharge Instructions (Addendum)
No driving for 4 days. No lifting over 5 lbs for 1 week. No vigorous or sexual activity for 1 week. You may return to work on 08/03/16. Keep procedure site clean & dry. If you notice increased pain, swelling, bleeding or pus, call/return!  You may shower, but no soaking baths/hot tubs/pools for 1 week.   You have an appointment set up with the [facility].  Multiple studies have shown that being followed by a dedicated atrial fibrillation clinic in addition to the standard care you receive from your other physicians improves health. We believe that enrollment in the atrial fibrillation clinic will allow [place] to better care for you.   The phone number to the [facility] is [phone]. The clinic is staffed Monday through Friday from 8:30am to 5pm.  Parking Directions: The clinic is located in the Heart and Vascular Building connected to [facility]. 1)From [address] turn on to [address] and go to the 3rd entrance  (Heart and Vascular entrance) on the right. 2)Look to the right for Heart &Vascular Parking Garage. 3)the code for the entrance is 7000 for July.   4)Take the elevators to the 1st floor. Registration is in the room with the glass walls at the end of the hallway.  If you have any trouble parking or locating the clinic, please dont hesitate to call [phone].     TEE  YOU HAD AN CARDIAC PROCEDURE TODAY: Refer to the procedure report and other information in the discharge instructions given to you for any specific questions about what was found during the examination. If this information does not answer your questions, please call [facility] office at [phone] to clarify.   DIET: Your first meal following the procedure should be a light meal and then it is ok to progress to your normal diet. A half-sandwich or bowl of soup is an example of a good first meal. Heavy or fried foods are harder to digest and may make you feel nauseous  or bloated. Drink plenty of fluids but you should avoid alcoholic beverages for 24 hours. If you had a esophageal dilation, please see attached instructions for diet.   ACTIVITY: Your care partner should take you home directly after the procedure. You should plan to take it easy, moving slowly for the rest of the day. You can resume normal activity the day after the procedure however YOU SHOULD NOT DRIVE, use power tools, machinery or perform tasks that involve climbing or major physical exertion for 24 hours (because of the sedation medicines used during the test).   SYMPTOMS TO REPORT IMMEDIATELY: A cardiologist can be reached at any hour. Please call [phone] for any of the following symptoms:  Vomiting of blood or coffee ground material  New, significant abdominal pain  New, significant chest pain or pain under the shoulder blades  Painful or persistently difficult swallowing  New shortness of breath  Black, tarry-looking or red, bloody stools  FOLLOW UP:  Please also call with any specific questions about appointments or follow up tests.      Information on my medicine - ELIQUIS (apixaban)  This medication education was reviewed with me or my healthcare representative as part of my discharge preparation.  The pharmacist that spoke with me during my hospital stay was:  Wayland Salinas, [facility]  Why was Eliquis prescribed for you? Eliquis was prescribed for you to reduce the risk of a blood clot forming that can cause a stroke if you have  a medical condition called atrial fibrillation (a type of irregular heartbeat).  What do You need to know about Eliquis ? Take your Eliquis TWICE DAILY - one tablet in the morning and one tablet in the evening with or without food. If you have difficulty swallowing the tablet whole please discuss with your pharmacist how to take the medication safely.  Take Eliquis exactly as prescribed by your doctor and DO NOT stop taking  Eliquis without talking to the doctor who prescribed the medication.  Stopping may increase your risk of developing a stroke.  Refill your prescription before you run out.  After discharge, you should have regular check-up appointments with your healthcare provider that is prescribing your Eliquis.  In the future your dose may need to be changed if your kidney function or weight changes by a significant amount or as you get older.  What do you do if you miss a dose? If you miss a dose, take it as soon as you remember on the same day and resume taking twice daily.  Do not take more than one dose of ELIQUIS at the same time to make up a missed dose.  Important Safety Information A possible side effect of Eliquis is bleeding. You should call your healthcare provider right away if you experience any of the following: ? Bleeding from an injury or your nose that does not stop. ? Unusual colored urine (red or dark brown) or unusual colored stools (red or black). ? Unusual bruising for unknown reasons. ? A serious fall or if you hit your head (even if there is no bleeding).  Some medicines may interact with Eliquis and might increase your risk of bleeding or clotting while on Eliquis. To help avoid this, consult your healthcare provider or pharmacist prior to using any new prescription or non-prescription medications, including herbals, vitamins, non-steroidal anti-inflammatory drugs (NSAIDs) and supplements.  This website has more information on Eliquis (apixaban): http://www.eliquis.com/eliquis/home

## 2016-08-02 NOTE — Telephone Encounter (Signed)
Pt states that since ablation his BP has been running in the 160-170 sys range. He states that he told Dr. Rayann Heman that HCTZ 12.5 was making his feet and legs cramp, so he was told to decrease this med to 1/2 tab every other day. He has not taken since Saturday. Advised to take 1/2 tab for the next 3 days and call the office on Wednesday pm to report BP. Kt was 4.0 on last check.

## 2016-08-04 ENCOUNTER — Other Ambulatory Visit (HOSPITAL_COMMUNITY): Payer: Self-pay | Admitting: *Deleted

## 2016-08-04 MED ORDER — SPIRONOLACTONE 25 MG PO TABS: 12.5000 mg | ORAL_TABLET | Freq: Every day | ORAL | 0 refills | Status: DC

## 2016-08-04 NOTE — Telephone Encounter (Signed)
Pt cld reporting after starting HCTZ 12.5 mg daily he is still having elevated BPs. Today 157/100, yesterday 145/93.  Pt is already on Lisinopril 20 mg bid, Metoprolol 25 mg bid and verapamil 240 mg daily.  Pt was advised per Roderic Palau, NP to STOP HCTZ and START Spironolactone 25 mg, 1/2 tablet qd.  Pt to have BP, lab check Monday.  Pt understood instructions and was agreeable to come in Monday.

## 2016-08-09 ENCOUNTER — Other Ambulatory Visit (HOSPITAL_COMMUNITY): Payer: Self-pay | Admitting: *Deleted

## 2016-08-09 ENCOUNTER — Encounter (HOSPITAL_COMMUNITY): Payer: Self-pay | Admitting: Nurse Practitioner

## 2016-08-09 ENCOUNTER — Ambulatory Visit (HOSPITAL_COMMUNITY)
Admission: RE | Admit: 2016-08-09 | Discharge: 2016-08-09 | Disposition: A | Discharge status: Home / Self Care | Payer: 59 | Source: Ambulatory Visit | Attending: Nurse Practitioner | Admitting: Nurse Practitioner

## 2016-08-09 DIAGNOSIS — Z0130 Encounter for examination of blood pressure without abnormal findings: Secondary | ICD-10-CM | POA: Diagnosis not present

## 2016-08-09 LAB — BASIC METABOLIC PANEL
Anion gap: 5 (ref 5–15)
BUN: 10 mg/dL (ref 6–20)
CALCIUM: 9.4 mg/dL (ref 8.9–10.3)
CO2: 29 mmol/L (ref 22–32)
CREATININE: 1.17 mg/dL (ref 0.61–1.24)
Chloride: 107 mmol/L (ref 101–111)
GFR calc Af Amer: >60 mL/min (ref >60)
GLUCOSE: 97 mg/dL (ref 65–99)
POTASSIUM: 3.9 mmol/L (ref 3.5–5.1)
Sodium: 141 mmol/L (ref 135–145)

## 2016-08-09 MED ORDER — SPIRONOLACTONE 25 MG PO TABS: 25.0000 mg | ORAL_TABLET | Freq: Every day | ORAL | 0 refills | Status: DC

## 2016-08-09 NOTE — Progress Notes (Addendum)
Pt in for BP check and labs after having medications changed.  Pt stopped HCTZ and started spironolactone.  Pt states BPs are still high at home.  Pt will have BMET drawn today.  Pt called to office last week c/o elevated BP since ablation. He had decreased hctz to 1/2 tab qod with Dr. Jackalyn Lombard approval for leg cramps.  He was told to take hctz when I talked with him, 3 days in a row, with some improvement in BP but still elevated. He was told to stop HCTZ and start spironolactone 25 mg 1/2 tab a day. He is in the clinic for f/u with bmet. BP initially 152/86 and then rechecked 10 mins later 138/88. Pt has seen BP around 140-150 sys at home.Depending on labs may increase to one tab a day.

## 2016-08-10 ENCOUNTER — Encounter (HOSPITAL_BASED_OUTPATIENT_CLINIC_OR_DEPARTMENT_OTHER): Payer: Self-pay

## 2016-08-11 NOTE — Telephone Encounter (Signed)
Patient has a 10 week sleep follow appointment scheduled for 10/22/2016 at 8:00 am. Patient understands this appointment is for his insurance compliance and that he needs to keep this appointment. Patient was grateful for the call and thanked me.

## 2016-08-16 ENCOUNTER — Inpatient Hospital Stay (HOSPITAL_COMMUNITY): Admission: RE | Admit: 2016-08-16 | Payer: Self-pay | Source: Ambulatory Visit | Admitting: Nurse Practitioner

## 2016-08-17 ENCOUNTER — Ambulatory Visit (HOSPITAL_COMMUNITY)
Admission: RE | Admit: 2016-08-17 | Discharge: 2016-08-17 | Disposition: A | Discharge status: Home / Self Care | Payer: 59 | Source: Ambulatory Visit | Attending: Nurse Practitioner | Admitting: Nurse Practitioner

## 2016-08-17 VITALS — BP 124/76

## 2016-08-17 DIAGNOSIS — I10 Essential (primary) hypertension: Secondary | ICD-10-CM

## 2016-08-17 DIAGNOSIS — I4891 Unspecified atrial fibrillation: Secondary | ICD-10-CM | POA: Insufficient documentation

## 2016-08-17 LAB — BASIC METABOLIC PANEL
Anion gap: 4 — ABNORMAL LOW (ref 5–15)
BUN: 13 mg/dL (ref 6–20)
CHLORIDE: 104 mmol/L (ref 101–111)
CO2: 30 mmol/L (ref 22–32)
CREATININE: 1.25 mg/dL — AB (ref 0.61–1.24)
Calcium: 9.4 mg/dL (ref 8.9–10.3)
GFR calc Af Amer: >60 mL/min (ref >60)
GFR calc non Af Amer: >60 mL/min (ref >60)
GLUCOSE: 89 mg/dL (ref 65–99)
POTASSIUM: 4.4 mmol/L (ref 3.5–5.1)
SODIUM: 138 mmol/L (ref 135–145)

## 2016-08-17 NOTE — Progress Notes (Addendum)
Pt in for repeat bmet and BP check  Has been placed on 25 mg spironolactone after HCTZ was stopped due to poorly controlled BP at 1/2 tab every other day. Higher daily doses gave him leg cramping. BP at home better controlled in the 120 sys range. Bmet pending.BP today 124/76. F/u as planned 8/10 in afib clinic.

## 2016-08-23 DIAGNOSIS — G4733 Obstructive sleep apnea (adult) (pediatric): Secondary | ICD-10-CM | POA: Diagnosis not present

## 2016-08-25 ENCOUNTER — Encounter: Payer: Self-pay | Admitting: Cardiology

## 2016-08-27 ENCOUNTER — Encounter (HOSPITAL_COMMUNITY): Payer: Self-pay | Admitting: Nurse Practitioner

## 2016-08-27 ENCOUNTER — Other Ambulatory Visit (HOSPITAL_COMMUNITY): Payer: Self-pay | Admitting: Nurse Practitioner

## 2016-08-27 ENCOUNTER — Ambulatory Visit (HOSPITAL_COMMUNITY)
Admit: 2016-08-27 | Discharge: 2016-08-27 | Disposition: A | Discharge status: Home / Self Care | Payer: 59 | Source: Ambulatory Visit | Attending: Nurse Practitioner | Admitting: Nurse Practitioner

## 2016-08-27 VITALS — BP 130/84 | HR 76 | Ht 71.0 in | Wt 201.4 lb

## 2016-08-27 DIAGNOSIS — Z888 Allergy status to other drugs, medicaments and biological substances status: Secondary | ICD-10-CM | POA: Insufficient documentation

## 2016-08-27 DIAGNOSIS — I10 Essential (primary) hypertension: Secondary | ICD-10-CM | POA: Insufficient documentation

## 2016-08-27 DIAGNOSIS — Z7901 Long term (current) use of anticoagulants: Secondary | ICD-10-CM | POA: Diagnosis not present

## 2016-08-27 DIAGNOSIS — Z885 Allergy status to narcotic agent status: Secondary | ICD-10-CM | POA: Insufficient documentation

## 2016-08-27 DIAGNOSIS — Z91013 Allergy to seafood: Secondary | ICD-10-CM | POA: Diagnosis not present

## 2016-08-27 DIAGNOSIS — Z886 Allergy status to analgesic agent status: Secondary | ICD-10-CM | POA: Diagnosis not present

## 2016-08-27 DIAGNOSIS — Z8249 Family history of ischemic heart disease and other diseases of the circulatory system: Secondary | ICD-10-CM | POA: Insufficient documentation

## 2016-08-27 DIAGNOSIS — G4733 Obstructive sleep apnea (adult) (pediatric): Secondary | ICD-10-CM | POA: Diagnosis not present

## 2016-08-27 DIAGNOSIS — Z91041 Radiographic dye allergy status: Secondary | ICD-10-CM | POA: Insufficient documentation

## 2016-08-27 DIAGNOSIS — I481 Persistent atrial fibrillation: Secondary | ICD-10-CM | POA: Insufficient documentation

## 2016-08-27 DIAGNOSIS — Z79899 Other long term (current) drug therapy: Secondary | ICD-10-CM | POA: Diagnosis not present

## 2016-08-27 DIAGNOSIS — I4891 Unspecified atrial fibrillation: Secondary | ICD-10-CM | POA: Diagnosis not present
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
## 2016-09-01 ENCOUNTER — Telehealth: Payer: Self-pay | Admitting: *Deleted

## 2016-09-01 NOTE — Telephone Encounter (Signed)
Informed patient of compliance results and patient understanding was verbalized. Patient states he missed one day because he was having an ablation that was successful. Patient understand his events per hour are in normal range. Patient understands his settings will not change. Patient was grateful for the call and thanked me.

## 2016-09-01 NOTE — Telephone Encounter (Signed)
-----   Message from Sueanne Margarita, MD sent at 08/30/2016 10:24 PM EDT ----- Good AHI and compliance.  Continue current CPAP settings.

## 2016-09-03 ENCOUNTER — Other Ambulatory Visit (HOSPITAL_COMMUNITY): Payer: Self-pay | Admitting: Nurse Practitioner

## 2016-09-03 ENCOUNTER — Other Ambulatory Visit: Payer: Self-pay | Admitting: *Deleted

## 2016-09-03 ENCOUNTER — Ambulatory Visit (HOSPITAL_COMMUNITY)
Admission: RE | Admit: 2016-09-03 | Discharge: 2016-09-03 | Disposition: A | Discharge status: Home / Self Care | Payer: 59 | Source: Ambulatory Visit | Attending: Urology | Admitting: Urology

## 2016-09-03 DIAGNOSIS — N200 Calculus of kidney: Secondary | ICD-10-CM | POA: Diagnosis not present

## 2016-09-03 DIAGNOSIS — N281 Cyst of kidney, acquired: Secondary | ICD-10-CM | POA: Insufficient documentation

## 2016-09-03 MED ORDER — APIXABAN 5 MG PO TABS: 5.0000 mg | ORAL_TABLET | Freq: Two times a day (BID) | ORAL | 8 refills | Status: DC

## 2016-09-03 NOTE — Telephone Encounter (Signed)
Pt last saw Dr Rayann Heman on 06/28/16, last labs 08/17/16 Creat 1.25, age 61, weight 91.4kg, based on specified criteria pt is on appropriate dosage of Eliquis 5mg  BID.  Will refill rx.

## 2016-09-03 NOTE — Telephone Encounter (Signed)
Patient called and stated that Anthony [name] office has been refilling the eliquis however when he called her office today he got a message to call our office as they were in training and not taking calls and any messages left would not be addressed until Monday.

## 2016-09-23 DIAGNOSIS — G4733 Obstructive sleep apnea (adult) (pediatric): Secondary | ICD-10-CM | POA: Diagnosis not present

## 2016-09-28 ENCOUNTER — Other Ambulatory Visit: Payer: Self-pay | Admitting: Family Medicine

## 2016-09-29 ENCOUNTER — Ambulatory Visit (HOSPITAL_COMMUNITY): Payer: Self-pay | Admitting: Nurse Practitioner

## 2016-10-01 ENCOUNTER — Ambulatory Visit (INDEPENDENT_AMBULATORY_CARE_PROVIDER_SITE_OTHER): Payer: 59 | Admitting: Urology

## 2016-10-01 DIAGNOSIS — N200 Calculus of kidney: Secondary | ICD-10-CM

## 2016-10-01 DIAGNOSIS — R3129 Other microscopic hematuria: Secondary | ICD-10-CM | POA: Diagnosis not present

## 2016-10-14 ENCOUNTER — Telehealth: Payer: Self-pay | Admitting: Family Medicine

## 2016-10-14 ENCOUNTER — Other Ambulatory Visit: Payer: Self-pay | Admitting: Family Medicine

## 2016-10-14 ENCOUNTER — Other Ambulatory Visit (HOSPITAL_COMMUNITY): Payer: Self-pay | Admitting: Physician Assistant

## 2016-10-14 ENCOUNTER — Other Ambulatory Visit: Payer: Self-pay

## 2016-10-14 ENCOUNTER — Other Ambulatory Visit: Payer: Self-pay | Admitting: *Deleted

## 2016-10-14 MED ORDER — LISINOPRIL 20 MG PO TABS: 20.0000 mg | ORAL_TABLET | Freq: Two times a day (BID) | ORAL | 1 refills | Status: DC

## 2016-10-14 MED ORDER — VERAPAMIL HCL ER 240 MG PO CP24: 240.0000 mg | ORAL_CAPSULE | Freq: Every day | ORAL | 1 refills | Status: DC

## 2016-10-14 MED ORDER — ALPRAZOLAM 0.5 MG PO TABS: 0.5000 mg | ORAL_TABLET | Freq: Every day | ORAL | 5 refills | Status: DC | PRN

## 2016-10-14 MED ORDER — PANTOPRAZOLE SODIUM 40 MG PO TBEC: 40.0000 mg | DELAYED_RELEASE_TABLET | Freq: Every day | ORAL | 1 refills | Status: DC

## 2016-10-14 MED ORDER — SUMATRIPTAN SUCCINATE 50 MG PO TABS: ORAL_TABLET | ORAL | 5 refills | Status: DC

## 2016-10-14 NOTE — Telephone Encounter (Signed)
Ok si mo worth

## 2016-10-14 NOTE — Telephone Encounter (Signed)
Await signature. Call after faxing xanax. imitrex already sent through epic.

## 2016-10-14 NOTE — Telephone Encounter (Signed)
Pt needs refill on his Xanax, Imitrex  Pt has appointment here 10/27/16  Please call pt when done  [facility]

## 2016-10-14 NOTE — Telephone Encounter (Signed)
Ok six mo 

## 2016-10-14 NOTE — Telephone Encounter (Signed)
I called and left a detailed message that all medications he requested have been sent into the Pharmacy. If any questions or concerns to please call our office.CS 10/14/2016 @ 3:07pm

## 2016-10-21 ENCOUNTER — Telehealth: Payer: Self-pay | Admitting: Internal Medicine

## 2016-10-21 NOTE — Telephone Encounter (Signed)
Recall for tcs °

## 2016-10-21 NOTE — Telephone Encounter (Signed)
Letter mailed to pt.  

## 2016-10-22 ENCOUNTER — Ambulatory Visit (INDEPENDENT_AMBULATORY_CARE_PROVIDER_SITE_OTHER): Payer: 59 | Admitting: Cardiology

## 2016-10-22 ENCOUNTER — Encounter: Payer: Self-pay | Admitting: Cardiology

## 2016-10-22 ENCOUNTER — Other Ambulatory Visit: Payer: Self-pay | Admitting: Family Medicine

## 2016-10-22 VITALS — BP 140/80 | HR 60 | Ht 71.0 in | Wt 205.2 lb

## 2016-10-22 DIAGNOSIS — I10 Essential (primary) hypertension: Secondary | ICD-10-CM | POA: Diagnosis not present

## 2016-10-22 DIAGNOSIS — G4733 Obstructive sleep apnea (adult) (pediatric): Secondary | ICD-10-CM

## 2016-10-22 NOTE — Patient Instructions (Signed)
Medication Instructions:  Your physician recommends that you continue on your current medications as directed. Please refer to the Current Medication list given to you today.   Labwork: -None  Testing/Procedures: -None  Follow-Up: Your physician wants you to follow-up in: 1 year f/u with Dr. Radford Pax. You will receive a reminder letter in the mail two months in advance. If you don't receive a letter, please call our office to schedule the follow-up appointment.  Keep follow up with Dr. Rayann Heman.  Any Other Special Instructions Will Be Listed Below (If Applicable).     If you need a refill on your cardiac medications before your next appointment, please call your pharmacy.
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
## 2016-10-23 DIAGNOSIS — G4733 Obstructive sleep apnea (adult) (pediatric): Secondary | ICD-10-CM | POA: Diagnosis not present

## 2016-10-25 NOTE — Telephone Encounter (Signed)
This patient cannot be on this. #1 notify the pharmacy to take this off his med list patient is on Eliquis No. 2 deny the prescription

## 2016-10-27 ENCOUNTER — Encounter: Payer: Self-pay | Admitting: Family Medicine

## 2016-10-27 ENCOUNTER — Ambulatory Visit (INDEPENDENT_AMBULATORY_CARE_PROVIDER_SITE_OTHER): Payer: 59 | Admitting: Family Medicine

## 2016-10-27 VITALS — BP 130/90 | Ht 71.0 in | Wt 207.1 lb

## 2016-10-27 DIAGNOSIS — F321 Major depressive disorder, single episode, moderate: Secondary | ICD-10-CM | POA: Diagnosis not present

## 2016-10-27 DIAGNOSIS — E785 Hyperlipidemia, unspecified: Secondary | ICD-10-CM | POA: Diagnosis not present

## 2016-10-27 DIAGNOSIS — I10 Essential (primary) hypertension: Secondary | ICD-10-CM

## 2016-10-27 MED ORDER — EZETIMIBE 10 MG PO TABS
10.0000 mg | ORAL_TABLET | Freq: Every day | ORAL | 1 refills | Status: DC
Start: 2016-10-27 — End: 2017-03-08

## 2016-10-27 MED ORDER — ESCITALOPRAM OXALATE 10 MG PO TABS: 10.0000 mg | ORAL_TABLET | ORAL | 5 refills | Status: DC

## 2016-10-27 MED ORDER — PANTOPRAZOLE SODIUM 40 MG PO TBEC: 40.0000 mg | DELAYED_RELEASE_TABLET | Freq: Every day | ORAL | 1 refills | Status: DC

## 2016-10-27 MED ORDER — VERAPAMIL HCL ER 240 MG PO CP24: 240.0000 mg | ORAL_CAPSULE | Freq: Every day | ORAL | 1 refills | Status: DC

## 2016-10-27 NOTE — Progress Notes (Signed)
   Subjective:    Patient ID: Anthony [name], male    DOB: Jul 10, 1955, 61 y.o.   MRN: [id number] Patient arrives with numerous concerns Hypertension  This is a chronic problem. The current episode started more than 1 year ago.   Patient would also like to discuss an alternative to pain medication since he is unable to take.chronic back pain and knee pain.Now on [name] Spanish for anticoagulation purposes of so cannot use anti-inflammatory medication.  Blood pressure medicine and blood pressure levels reviewed today with patient. Compliant with blood pressure medicine. States does not miss a dose. No obvious side effects. Blood pressure generally good when checked elsewhere. Watching salt intake.   Patient continues to take lipid medication regularly. No obvious side effects from it. Generally does not miss a dose. Prior blood work results are reviewed with patient. Patient continues to work on fat intake in diet   Recently went through ablation. Sinus rhythm on last visit to cardiologist.They still want to maintain anticoagulation for now. No excess bleeding Review of Systems No headache, no major weight loss or weight gain, no chest pain no back pain abdominal pain no change in bowel habits complete ROS otherwise negative     Objective:   Physical Exam  Alert and oriented, vitals reviewed and stable, NAD ENT-TM's and ext canals WNL bilat via otoscopic exam Soft palate, tonsils and post pharynx WNL via oropharyngeal exam Neck-symmetric, no masses; thyroid nonpalpable and nontender Pulmonary-no tachypnea or accessory muscle use; Clear without wheezes via auscultation Card--no abnrml murmurs, rhythm reg and rate WNL Carotid pulses symmetric, without bruits       Assessment & Plan:  Impression 1 hypertension good control discussed maintain same  #2 depression clinically stable discussed maintain same #3 hyperlipidemia status uncertain prior blood work reviewed  #4 atrial  fibrillation no now back into sinus rhythm. Discussed. Patient to maintain anticoagulation per cardiologist  Patient declines flu shot. Appropriate blood work. Medications refilled. Diet exercise discussed. Follow-up as scheduled wellness then also

## 2016-10-29 ENCOUNTER — Encounter: Payer: Self-pay | Admitting: Internal Medicine

## 2016-10-29 ENCOUNTER — Ambulatory Visit (INDEPENDENT_AMBULATORY_CARE_PROVIDER_SITE_OTHER): Payer: 59 | Admitting: Internal Medicine

## 2016-10-29 VITALS — BP 132/82 | HR 66 | Ht 71.0 in | Wt 209.0 lb

## 2016-10-29 DIAGNOSIS — I119 Hypertensive heart disease without heart failure: Secondary | ICD-10-CM

## 2016-10-29 DIAGNOSIS — I4819 Other persistent atrial fibrillation: Secondary | ICD-10-CM

## 2016-10-29 DIAGNOSIS — I481 Persistent atrial fibrillation: Secondary | ICD-10-CM

## 2016-10-29 DIAGNOSIS — G4733 Obstructive sleep apnea (adult) (pediatric): Secondary | ICD-10-CM | POA: Diagnosis not present
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
## 2016-10-29 NOTE — Patient Instructions (Signed)
Medication Instructions:  Your physician recommends that you continue on your current medications as directed. Please refer to the Current Medication list given to you today.  -- If you need a refill on your cardiac medications before your next appointment, please call your pharmacy. --  Labwork: None ordered  Testing/Procedures: None ordered  Follow-Up: Your physician wants you to follow-up in: 3 months with Dr. Rayann Heman.  You will receive a reminder letter in the mail two months in advance. If you don't receive a letter, please call our office to schedule the follow-up appointment.  Thank you for choosing [facility]!!   Frederik Schmidt, RN [phone]  Any Other Special Instructions Will Be Listed Below (If Applicable).

## 2016-11-01 DIAGNOSIS — E785 Hyperlipidemia, unspecified: Secondary | ICD-10-CM | POA: Diagnosis not present

## 2016-11-01 DIAGNOSIS — I10 Essential (primary) hypertension: Secondary | ICD-10-CM | POA: Diagnosis not present

## 2016-11-02 LAB — HEPATIC FUNCTION PANEL
ALK PHOS: 57 IU/L (ref 39–117)
ALT: 25 IU/L (ref 0–44)
AST: 16 IU/L (ref 0–40)
Albumin: 4.1 g/dL (ref 3.6–4.8)
Bilirubin Total: 0.4 mg/dL (ref 0.0–1.2)
Bilirubin, Direct: 0.07 mg/dL (ref 0.00–0.40)
Total Protein: 6.4 g/dL (ref 6.0–8.5)

## 2016-11-02 LAB — BASIC METABOLIC PANEL
BUN / CREAT RATIO: 12 (ref 10–24)
BUN: 16 mg/dL (ref 8–27)
CO2: 25 mmol/L (ref 20–29)
CREATININE: 1.38 mg/dL — AB (ref 0.76–1.27)
Calcium: 9.3 mg/dL (ref 8.6–10.2)
Chloride: 106 mmol/L (ref 96–106)
GFR, EST AFRICAN AMERICAN: 63 mL/min/{1.73_m2} (ref >59)
GFR, EST NON AFRICAN AMERICAN: 55 mL/min/{1.73_m2} — AB (ref >59)
GLUCOSE: 100 mg/dL — AB (ref 65–99)
Potassium: 4.5 mmol/L (ref 3.5–5.2)
SODIUM: 145 mmol/L — AB (ref 134–144)

## 2016-11-02 LAB — LIPID PANEL
CHOLESTEROL TOTAL: 198 mg/dL (ref 100–199)
Chol/HDL Ratio: 6.2 ratio — ABNORMAL HIGH (ref 0.0–5.0)
HDL: 32 mg/dL — AB (ref >39)
LDL CALC: 141 mg/dL — AB (ref 0–99)
TRIGLYCERIDES: 125 mg/dL (ref 0–149)
VLDL CHOLESTEROL CAL: 25 mg/dL (ref 5–40)

## 2016-11-07 ENCOUNTER — Encounter: Payer: Self-pay | Admitting: Family Medicine

## 2016-11-23 DIAGNOSIS — G4733 Obstructive sleep apnea (adult) (pediatric): Secondary | ICD-10-CM | POA: Diagnosis not present

## 2016-12-07 DIAGNOSIS — G4733 Obstructive sleep apnea (adult) (pediatric): Secondary | ICD-10-CM | POA: Diagnosis not present

## 2016-12-23 DIAGNOSIS — G4733 Obstructive sleep apnea (adult) (pediatric): Secondary | ICD-10-CM | POA: Diagnosis not present

## 2017-01-03 ENCOUNTER — Ambulatory Visit: Payer: 59 | Admitting: Family Medicine

## 2017-01-03 ENCOUNTER — Encounter: Payer: Self-pay | Admitting: Family Medicine

## 2017-01-03 VITALS — BP 124/88 | HR 66 | Temp 98.2°F | Ht 71.0 in | Wt 204.0 lb

## 2017-01-03 DIAGNOSIS — T50905A Adverse effect of unspecified drugs, medicaments and biological substances, initial encounter: Secondary | ICD-10-CM

## 2017-01-03 DIAGNOSIS — I10 Essential (primary) hypertension: Secondary | ICD-10-CM

## 2017-01-03 DIAGNOSIS — N62 Hypertrophy of breast: Secondary | ICD-10-CM

## 2017-01-03 MED ORDER — AMOXICILLIN 500 MG PO CAPS: 500.0000 mg | ORAL_CAPSULE | Freq: Three times a day (TID) | ORAL | 0 refills | Status: DC

## 2017-01-03 MED ORDER — HYDROCHLOROTHIAZIDE 25 MG PO TABS: 25.0000 mg | ORAL_TABLET | Freq: Every day | ORAL | 3 refills | Status: DC

## 2017-01-03 NOTE — Progress Notes (Signed)
   Subjective:    Patient ID: Anthony [name], male    DOB: 12/31/1955, 60 y.o.   MRN: [id number]  HPI  Patient is here today with complaints of chest discomfort on both sides of chest. He state he has noticed knots in both of the breast. The discomfort started a week ago,but he states the knots he noticed a month ago.    He has not been taking anything for this  No headache no exertional pain no nausea no  .He is also having a cough,runny nose,headache and sore throat as well.  Was started on spironolactone half a year ago.  Had atrial fibrillation.  Echocardiogram showed no major ventricular dysfunction.  Had elevated blood pressure.  Spironolactone to help by cardiologist  Review of Systems No headache, no major weight loss or weight gain, no chest pain no back pain abdominal pain no change in bowel habits complete ROS otherwise negative     Objective:   Physical Exam Alert and oriented, vitals reviewed and stable, NAD ENT-TM's and ext canals WNL bilat via otoscopic exam Soft palate, tonsils and post pharynx WNL via oropharyngeal exam Neck-symmetric, no masses; thyroid nonpalpable and nontender Pulmonary-no tachypnea or accessory muscle use; Clear without wheezes via auscultation Card--no abnrml murmurs, rhythm reg and rate WNL Carotid pulses symmetric, without bruits Moderate nasal congestion noted.  Breasts symmetrically in bilateral mild enlargement tender   impression bilateral gynecomastia secondary to Spironolactone discussed       Assessment & Plan:  #2 subacute rhinosinusitis.  Discussed  3.  Chronic Spironolactone your slightly related  4.  Hypertension good control  Greater than 50% of this 25 minute face to face visit was spent in counseling and discussion and coordination of care regarding the above diagnosis/diagnosies

## 2017-01-12 DIAGNOSIS — E785 Hyperlipidemia, unspecified: Secondary | ICD-10-CM | POA: Insufficient documentation

## 2017-01-12 DIAGNOSIS — R51 Headache: Secondary | ICD-10-CM

## 2017-01-12 DIAGNOSIS — G43909 Migraine, unspecified, not intractable, without status migrainosus: Secondary | ICD-10-CM | POA: Insufficient documentation

## 2017-01-12 DIAGNOSIS — Z9989 Dependence on other enabling machines and devices: Secondary | ICD-10-CM | POA: Insufficient documentation

## 2017-01-12 DIAGNOSIS — G4733 Obstructive sleep apnea (adult) (pediatric): Secondary | ICD-10-CM | POA: Insufficient documentation

## 2017-01-12 DIAGNOSIS — G8929 Other chronic pain: Secondary | ICD-10-CM | POA: Insufficient documentation

## 2017-01-12 DIAGNOSIS — M549 Dorsalgia, unspecified: Secondary | ICD-10-CM

## 2017-01-12 DIAGNOSIS — G4700 Insomnia, unspecified: Secondary | ICD-10-CM | POA: Insufficient documentation

## 2017-01-12 DIAGNOSIS — M419 Scoliosis, unspecified: Secondary | ICD-10-CM | POA: Insufficient documentation

## 2017-01-12 DIAGNOSIS — I878 Other specified disorders of veins: Secondary | ICD-10-CM | POA: Insufficient documentation

## 2017-01-12 DIAGNOSIS — K219 Gastro-esophageal reflux disease without esophagitis: Secondary | ICD-10-CM | POA: Insufficient documentation

## 2017-01-12 DIAGNOSIS — J329 Chronic sinusitis, unspecified: Secondary | ICD-10-CM | POA: Insufficient documentation

## 2017-01-12 DIAGNOSIS — R519 Headache, unspecified: Secondary | ICD-10-CM | POA: Insufficient documentation

## 2017-01-12 DIAGNOSIS — I10 Essential (primary) hypertension: Secondary | ICD-10-CM | POA: Insufficient documentation

## 2017-01-12 DIAGNOSIS — Z87442 Personal history of urinary calculi: Secondary | ICD-10-CM | POA: Insufficient documentation

## 2017-01-12 DIAGNOSIS — F419 Anxiety disorder, unspecified: Secondary | ICD-10-CM | POA: Insufficient documentation

## 2017-01-23 DIAGNOSIS — G4733 Obstructive sleep apnea (adult) (pediatric): Secondary | ICD-10-CM | POA: Diagnosis not present

## 2017-02-02 ENCOUNTER — Ambulatory Visit: Payer: Self-pay | Admitting: Internal Medicine

## 2017-02-04 ENCOUNTER — Encounter: Payer: Self-pay | Admitting: Internal Medicine

## 2017-02-04 ENCOUNTER — Ambulatory Visit: Payer: 59 | Admitting: Internal Medicine

## 2017-02-04 VITALS — BP 128/76 | HR 63 | Ht 71.0 in | Wt 212.0 lb

## 2017-02-04 DIAGNOSIS — I481 Persistent atrial fibrillation: Secondary | ICD-10-CM | POA: Diagnosis not present

## 2017-02-04 DIAGNOSIS — I119 Hypertensive heart disease without heart failure: Secondary | ICD-10-CM

## 2017-02-04 DIAGNOSIS — G4733 Obstructive sleep apnea (adult) (pediatric): Secondary | ICD-10-CM

## 2017-02-04 DIAGNOSIS — I4819 Other persistent atrial fibrillation: Secondary | ICD-10-CM
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
## 2017-02-04 NOTE — Patient Instructions (Addendum)

## 2017-02-23 DIAGNOSIS — G4733 Obstructive sleep apnea (adult) (pediatric): Secondary | ICD-10-CM | POA: Diagnosis not present

## 2017-03-08 ENCOUNTER — Other Ambulatory Visit (HOSPITAL_COMMUNITY): Payer: Self-pay | Admitting: Nurse Practitioner

## 2017-03-08 ENCOUNTER — Other Ambulatory Visit: Payer: Self-pay | Admitting: Family Medicine

## 2017-03-11 ENCOUNTER — Telehealth: Payer: Self-pay | Admitting: Family Medicine

## 2017-03-11 ENCOUNTER — Other Ambulatory Visit: Payer: Self-pay | Admitting: *Deleted

## 2017-03-11 MED ORDER — EZETIMIBE 10 MG PO TABS: 10.0000 mg | ORAL_TABLET | Freq: Every day | ORAL | 1 refills | Status: DC

## 2017-03-11 NOTE — Telephone Encounter (Signed)
Requesting refill for Zetia 10 mg tabs to [facility] on [address].  He said pharmacy told him they didn't receive what was sent over on 03/08/17.

## 2017-03-11 NOTE — Telephone Encounter (Signed)
Refill sent to pharm again and pt notified.

## 2017-03-22 ENCOUNTER — Other Ambulatory Visit: Payer: Self-pay | Admitting: Family Medicine

## 2017-03-23 ENCOUNTER — Telehealth: Payer: Self-pay | Admitting: Family Medicine

## 2017-03-23 DIAGNOSIS — G4733 Obstructive sleep apnea (adult) (pediatric): Secondary | ICD-10-CM | POA: Diagnosis not present

## 2017-03-23 DIAGNOSIS — Z125 Encounter for screening for malignant neoplasm of prostate: Secondary | ICD-10-CM

## 2017-03-23 DIAGNOSIS — E785 Hyperlipidemia, unspecified: Secondary | ICD-10-CM

## 2017-03-23 DIAGNOSIS — I10 Essential (primary) hypertension: Secondary | ICD-10-CM

## 2017-03-23 NOTE — Telephone Encounter (Signed)
Blood work ordered in Epic. Patient notified. 

## 2017-03-23 NOTE — Telephone Encounter (Signed)
Same plus psa 

## 2017-03-23 NOTE — Telephone Encounter (Signed)
Patient is having physical 4/12 and didn't know if he needed labs done or not.

## 2017-03-23 NOTE — Telephone Encounter (Signed)
Last labs 10/2016: Lipid, Liver, and Met 7

## 2017-04-08 ENCOUNTER — Encounter: Payer: Self-pay | Admitting: Nurse Practitioner

## 2017-04-08 ENCOUNTER — Ambulatory Visit: Payer: 59 | Admitting: Nurse Practitioner

## 2017-04-08 VITALS — BP 120/78 | Temp 97.6°F | Ht 71.0 in | Wt 211.8 lb

## 2017-04-08 DIAGNOSIS — J0100 Acute maxillary sinusitis, unspecified: Secondary | ICD-10-CM

## 2017-04-08 MED ORDER — AMOXICILLIN-POT CLAVULANATE 875-125 MG PO TABS: 1.0000 | ORAL_TABLET | Freq: Two times a day (BID) | ORAL | 0 refills | Status: DC

## 2017-04-08 NOTE — Patient Instructions (Signed)
Nasacort AQ

## 2017-04-09 ENCOUNTER — Encounter: Payer: Self-pay | Admitting: Nurse Practitioner

## 2017-04-09 NOTE — Progress Notes (Signed)
Subjective: Presents for complaints of sinus symptoms for the past 3-4 days.  No fever.  Sore throat.  Maxillary area sinus headache.  Greenish brown nasal drainage.  Slight cough.  No wheezing.  Left ear pain.  Had some dizziness initially which has improved.  Has been taking Tylenol for his symptoms.  Objective:   BP 120/78   Temp 97.6 F (36.4 C) (Oral)   Ht 5\' 11"  (1.803 m)   Wt 211 lb 12.8 oz (96.1 kg)   BMI 29.54 kg/m  NAD.  Alert, oriented.  TMs retracted, no erythema.  Posterior pharynx mildly erythematous with PND noted.  Neck supple with mild soft anterior adenopathy.  Lungs clear.  Heart regular rate and rhythm.  Gait normal limit.  Assessment:  Acute non-recurrent maxillary sinusitis    Plan:   Meds ordered this encounter  Medications  . amoxicillin-clavulanate (AUGMENTIN) 875-125 MG tablet    Sig: Take 1 tablet by mouth 2 (two) times daily.    Dispense:  20 tablet    Refill:  0    Order Specific Question:   Supervising Provider    Answer:   Mikey Kirschner [[id number]]   Restart Claritin as directed.  Add Nasacort AQ to regimen as directed.  Call back if symptoms worsen or persist.

## 2017-04-10 ENCOUNTER — Other Ambulatory Visit: Payer: Self-pay | Admitting: Family Medicine

## 2017-04-23 ENCOUNTER — Other Ambulatory Visit: Payer: Self-pay | Admitting: Family Medicine

## 2017-04-23 DIAGNOSIS — G4733 Obstructive sleep apnea (adult) (pediatric): Secondary | ICD-10-CM | POA: Diagnosis not present

## 2017-04-25 DIAGNOSIS — Z125 Encounter for screening for malignant neoplasm of prostate: Secondary | ICD-10-CM | POA: Diagnosis not present

## 2017-04-25 DIAGNOSIS — I10 Essential (primary) hypertension: Secondary | ICD-10-CM | POA: Diagnosis not present

## 2017-04-25 DIAGNOSIS — E785 Hyperlipidemia, unspecified: Secondary | ICD-10-CM | POA: Diagnosis not present

## 2017-04-26 LAB — BASIC METABOLIC PANEL
BUN / CREAT RATIO: 14 (ref 10–24)
BUN: 19 mg/dL (ref 8–27)
CHLORIDE: 101 mmol/L (ref 96–106)
CO2: 27 mmol/L (ref 20–29)
Calcium: 9.4 mg/dL (ref 8.6–10.2)
Creatinine, Ser: 1.36 mg/dL — ABNORMAL HIGH (ref 0.76–1.27)
GFR calc non Af Amer: 56 mL/min/{1.73_m2} — ABNORMAL LOW (ref >59)
GFR, EST AFRICAN AMERICAN: 64 mL/min/{1.73_m2} (ref >59)
GLUCOSE: 95 mg/dL (ref 65–99)
Potassium: 4.3 mmol/L (ref 3.5–5.2)
SODIUM: 142 mmol/L (ref 134–144)

## 2017-04-26 LAB — HEPATIC FUNCTION PANEL
ALT: 26 IU/L (ref 0–44)
AST: 19 IU/L (ref 0–40)
Albumin: 4.5 g/dL (ref 3.6–4.8)
Alkaline Phosphatase: 56 IU/L (ref 39–117)
BILIRUBIN TOTAL: 0.4 mg/dL (ref 0.0–1.2)
BILIRUBIN, DIRECT: 0.11 mg/dL (ref 0.00–0.40)
Total Protein: 6.7 g/dL (ref 6.0–8.5)

## 2017-04-26 LAB — LIPID PANEL
CHOL/HDL RATIO: 5.5 ratio — AB (ref 0.0–5.0)
Cholesterol, Total: 188 mg/dL (ref 100–199)
HDL: 34 mg/dL — AB (ref >39)
LDL Calculated: 132 mg/dL — ABNORMAL HIGH (ref 0–99)
TRIGLYCERIDES: 109 mg/dL (ref 0–149)
VLDL CHOLESTEROL CAL: 22 mg/dL (ref 5–40)

## 2017-04-26 LAB — PSA: Prostate Specific Ag, Serum: 1.2 ng/mL (ref 0.0–4.0)

## 2017-04-27 ENCOUNTER — Encounter: Payer: 59 | Admitting: Family Medicine

## 2017-04-29 ENCOUNTER — Encounter: Payer: Self-pay | Admitting: Family Medicine

## 2017-04-29 ENCOUNTER — Ambulatory Visit (INDEPENDENT_AMBULATORY_CARE_PROVIDER_SITE_OTHER): Payer: 59 | Admitting: Family Medicine

## 2017-04-29 VITALS — BP 110/70 | Ht 70.25 in | Wt 208.0 lb

## 2017-04-29 DIAGNOSIS — G8929 Other chronic pain: Secondary | ICD-10-CM | POA: Diagnosis not present

## 2017-04-29 DIAGNOSIS — E7800 Pure hypercholesterolemia, unspecified: Secondary | ICD-10-CM

## 2017-04-29 DIAGNOSIS — I10 Essential (primary) hypertension: Secondary | ICD-10-CM | POA: Diagnosis not present

## 2017-04-29 DIAGNOSIS — F321 Major depressive disorder, single episode, moderate: Secondary | ICD-10-CM | POA: Diagnosis not present

## 2017-04-29 DIAGNOSIS — Z0000 Encounter for general adult medical examination without abnormal findings: Secondary | ICD-10-CM

## 2017-04-29 DIAGNOSIS — E785 Hyperlipidemia, unspecified: Secondary | ICD-10-CM | POA: Diagnosis not present

## 2017-04-29 DIAGNOSIS — M25562 Pain in left knee: Secondary | ICD-10-CM

## 2017-04-29 DIAGNOSIS — Z0001 Encounter for general adult medical examination with abnormal findings: Secondary | ICD-10-CM

## 2017-04-29 MED ORDER — EZETIMIBE 10 MG PO TABS: 10.0000 mg | ORAL_TABLET | Freq: Every day | ORAL | 1 refills | Status: DC

## 2017-04-29 MED ORDER — VERAPAMIL HCL ER 240 MG PO CP24: ORAL_CAPSULE | ORAL | 1 refills | Status: DC

## 2017-04-29 MED ORDER — ZOLPIDEM TARTRATE 10 MG PO TABS: ORAL_TABLET | ORAL | 5 refills | Status: DC

## 2017-04-29 MED ORDER — HYDROCHLOROTHIAZIDE 25 MG PO TABS: 25.0000 mg | ORAL_TABLET | Freq: Every day | ORAL | 3 refills | Status: DC

## 2017-04-29 MED ORDER — LISINOPRIL 20 MG PO TABS: 20.0000 mg | ORAL_TABLET | Freq: Two times a day (BID) | ORAL | 5 refills | Status: DC

## 2017-04-29 MED ORDER — ESCITALOPRAM OXALATE 10 MG PO TABS: 10.0000 mg | ORAL_TABLET | ORAL | 5 refills | Status: DC

## 2017-04-29 MED ORDER — PANTOPRAZOLE SODIUM 40 MG PO TBEC: 40.0000 mg | DELAYED_RELEASE_TABLET | Freq: Every day | ORAL | 1 refills | Status: DC

## 2017-04-29 NOTE — Patient Instructions (Addendum)
For colonoscopy: Dr Lianne Moris  If you ever want knee injected call ahead and we'll get xray and bring you in after that for injection
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
## 2017-05-06 ENCOUNTER — Encounter: Payer: Self-pay | Admitting: Family Medicine

## 2017-05-06 ENCOUNTER — Encounter (INDEPENDENT_AMBULATORY_CARE_PROVIDER_SITE_OTHER): Payer: Self-pay

## 2017-05-09 ENCOUNTER — Other Ambulatory Visit: Payer: Self-pay | Admitting: Family Medicine

## 2017-05-09 NOTE — Telephone Encounter (Signed)
Six mo worth 

## 2017-05-16 ENCOUNTER — Other Ambulatory Visit: Payer: Self-pay | Admitting: Family Medicine

## 2017-05-31 ENCOUNTER — Other Ambulatory Visit: Payer: Self-pay | Admitting: Internal Medicine

## 2017-05-31 NOTE — Telephone Encounter (Signed)
Pt is a 62 yr old male that last saw Dr Rayann Heman on 02/04/17. Last noted weight was 94.3Kg on 04/29/17. Serum creatine from 04/25/17 was 1.36. Will refill pts Eliquis 5mg  BID.

## 2017-06-02 ENCOUNTER — Encounter: Payer: Self-pay | Admitting: Allergy & Immunology

## 2017-06-02 ENCOUNTER — Ambulatory Visit (INDEPENDENT_AMBULATORY_CARE_PROVIDER_SITE_OTHER): Payer: 59 | Admitting: Allergy & Immunology

## 2017-06-02 VITALS — BP 120/60 | HR 60 | Temp 97.8°F | Resp 16 | Ht 69.5 in | Wt 206.0 lb

## 2017-06-02 DIAGNOSIS — J310 Chronic rhinitis: Secondary | ICD-10-CM | POA: Diagnosis not present

## 2017-06-02 DIAGNOSIS — J302 Other seasonal allergic rhinitis: Secondary | ICD-10-CM

## 2017-06-02 DIAGNOSIS — T7800XD Anaphylactic reaction due to unspecified food, subsequent encounter: Secondary | ICD-10-CM

## 2017-06-02 DIAGNOSIS — J3089 Other allergic rhinitis: Secondary | ICD-10-CM | POA: Diagnosis not present

## 2017-06-02 DIAGNOSIS — T7800XA Anaphylactic reaction due to unspecified food, initial encounter: Secondary | ICD-10-CM | POA: Insufficient documentation

## 2017-06-02 NOTE — Patient Instructions (Addendum)
1. Chronic rhinitis with recent allergic reaction - Testing today showed: horse, trees, weeds, grasses, dust mites, cat and dog - Avoidance measures provided. - Stop taking: Claritin - Start taking: Zyrtec (cetirizine) 10mg  tablet once daily and Nasacort (triamcinolone) two sprays per nostril daily - You can use an extra dose of the antihistamine, if needed, for breakthrough symptoms.  - Consider nasal saline rinses 1-2 times daily to remove allergens from the nasal cavities as well as help with mucous clearance (this is especially helpful to do before the nasal sprays are given) - Consider allergy shots as a means of long-term control. - Allergy shots "re-train" and "reset" the immune system to ignore environmental allergens and decrease the resulting immune response to those allergens (sneezing, itchy watery eyes, runny nose, nasal congestion, etc).    - Allergy shots improve symptoms in 75-85% of patients.  - We can discuss more at the next appointment if the medications are not working for you.  2. Anaphylactic shock due to food - Testing was negative to [employer] , Fish Mix, Catfish, [place], [place], [place], [place], [place], [place], [place], [place], [place], [place], [place] and [employer]  - We will get blood work to confirm these findings.   - Training for epinephrine auto-injectors provided: AuviQ - There is a the low positive predictive value of food allergy testing and hence the high possibility of false positives. - In contrast, food allergy testing has a high negative predictive value, therefore if testing is negative we can be relatively assured that they are indeed negative.  - It is difficult to know how foods allergies will progress.   3. Return in about 3 months (around 09/02/2017).  Please inform us of any Emergency Department visits, hospitalizations, or changes in symptoms. Call us before going to the ED for breathing or allergy symptoms since we might be able to fit you in for a sick  visit. Feel free to contact us anytime with any questions, problems, or concerns.  It was a pleasure to meet you and your family today!  Websites that have reliable patient information: 1. American Academy of Asthma, Allergy, and Immunology: www.aaaai.org 2. Food Allergy Research and Education (FARE): foodallergy.org 3. Mothers of Asthmatics: http://www.asthmacommunitynetwork.org 4. American College of Allergy, Asthma, and Immunology: www.acaai.org   Reducing Pollen Exposure  The American Academy of Allergy, Asthma and Immunology suggests the following steps to reduce your exposure to pollen during allergy seasons.    1. Do not hang sheets or clothing out to dry; pollen may collect on these items. 2. Do not mow lawns or spend time around freshly cut grass; mowing stirs up pollen. 3. Keep windows closed at night.  Keep car windows closed while driving. 4. Minimize morning activities outdoors, a time when pollen counts are usually at their highest. 5. Stay indoors as much as possible when pollen counts or humidity is high and on windy days when pollen tends to remain in the air longer. 6. Use air conditioning when possible.  Many air conditioners have filters that trap the pollen spores. 7. Use a HEPA room air filter to remove pollen form the indoor air you breathe.  Control of House Dust Mite Allergen    House dust mites play a major role in allergic asthma and rhinitis.  They occur in environments with high humidity wherever human skin, the food for dust mites is found. High levels have been detected in dust obtained from mattresses, pillows, carpets, upholstered furniture, bed covers, clothes and soft toys.  The principal allergen of the house dust mite is found in its feces.  A gram of dust may contain 1,000 mites and 250,000 fecal particles.  Mite antigen is easily measured in the air during house cleaning activities.    1. Encase mattresses, including the box spring, and pillow, in  an air tight cover.  Seal the zipper end of the encased mattresses with wide adhesive tape. 2. Wash the bedding in water of 130 degrees Farenheit weekly.  Avoid cotton comforters/quilts and flannel bedding: the most ideal bed covering is the dacron comforter. 3. Remove all upholstered furniture from the bedroom. 4. Remove carpets, carpet padding, rugs, and non-washable window drapes from the bedroom.  Wash drapes weekly or use plastic window coverings. 5. Remove all non-washable stuffed toys from the bedroom.  Wash stuffed toys weekly. 6. Have the room cleaned frequently with a vacuum cleaner and a damp dust-mop.  The patient should not be in a room which is being cleaned and should wait 1 hour after cleaning before going into the room. 7. Close and seal all heating outlets in the bedroom.  Otherwise, the room will become filled with dust-laden air.  An electric heater can be used to heat the room. 8. Reduce indoor humidity to less than 50%.  Do not use a humidifier.  Control of Dog or Cat Allergen  Avoidance is the best way to manage a dog or cat allergy. If you have a dog or cat and are allergic to dog or cats, consider removing the dog or cat from the home. If you have a dog or cat but don't want to find it a new home, or if your family wants a pet even though someone in the household is allergic, here are some strategies that may help keep symptoms at bay:  1. Keep the pet out of your bedroom and restrict it to only a few rooms. Be advised that keeping the dog or cat in only one room will not limit the allergens to that room. 2. Don't pet, hug or kiss the dog or cat; if you do, wash your hands with soap and water. 3. High-efficiency particulate air (HEPA) cleaners run continuously in a bedroom or living room can reduce allergen levels over time. 4. Regular use of a high-efficiency vacuum cleaner or a central vacuum can reduce allergen levels. 5. Giving your dog or cat a bath at least once a  week can reduce airborne allergen.
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
## 2017-06-06 ENCOUNTER — Telehealth: Payer: Self-pay

## 2017-06-06 NOTE — Telephone Encounter (Signed)
Patient is calling because he called the [facility] and they do not have a prescription for [facility].  Please Advise

## 2017-06-07 MED ORDER — EPINEPHRINE 0.3 MG/0.3ML IJ SOAJ: INTRAMUSCULAR | 1 refills | Status: DC

## 2017-06-07 NOTE — Telephone Encounter (Signed)
AuviQ sent in called patient left message advising this was sent in

## 2017-06-11 LAB — ALLERGY PANEL 19, SEAFOOD GROUP
Allergen Salmon IgE: <0.1 kU/L
Codfish IgE: <0.1 kU/L
F080-IgE Lobster: <0.1 kU/L
Tuna: <0.1 kU/L

## 2017-06-11 LAB — ALLERGEN, APPLE F49: Allergen Apple, IgE: <0.1 kU/L

## 2017-06-20 NOTE — Progress Notes (Signed)
Called patient Left voicemail to call the office and set up seafood challenge

## 2017-07-05 ENCOUNTER — Encounter: Payer: Self-pay | Admitting: Family Medicine

## 2017-07-05 ENCOUNTER — Ambulatory Visit: Payer: 59 | Admitting: Family Medicine

## 2017-07-05 VITALS — BP 130/82 | Temp 98.2°F | Ht 69.5 in | Wt 207.6 lb

## 2017-07-05 DIAGNOSIS — J329 Chronic sinusitis, unspecified: Secondary | ICD-10-CM

## 2017-07-05 MED ORDER — HYDROCODONE-HOMATROPINE 5-1.5 MG/5ML PO SYRP: 5.0000 mL | ORAL_SOLUTION | Freq: Every evening | ORAL | 0 refills | Status: DC | PRN

## 2017-07-05 MED ORDER — AMOXICILLIN-POT CLAVULANATE 875-125 MG PO TABS: ORAL_TABLET | ORAL | 0 refills | Status: DC

## 2017-07-05 NOTE — Progress Notes (Signed)
   Subjective:    Patient ID: Anthony [name], male    DOB: [birth year], 62 y.o.   MRN: [id number]  Cough  This is a new problem. The current episode started in the past 7 days. Associated symptoms include ear pain, headaches, nasal congestion and a sore throat. Treatments tried: tylenol and rest.   Sig cough nd cong  And headache    And sore throat   Frontal ha, achiey, worse with cough, comes and goes  Neck stiff  Coughing a fair amnt some productive     Review of Systems  HENT: Positive for ear pain and sore throat.   Respiratory: Positive for cough.   Neurological: Positive for headaches.       Objective:   Physical Exam Alert, mild malaise. Hydration good Vitals stable. frontal/ maxillary tenderness evident positive nasal congestion. pharynx normal neck supple  lungs clear/no crackles or wheezes. heart regular in rhythm        Assessment & Plan:  Impression rhinosinusitis likely post viral, discussed with patient. plan antibiotics prescribed. Questions answered. Symptomatic care discussed. warning signs discussed. WSL

## 2017-07-06 ENCOUNTER — Encounter: Payer: Self-pay | Admitting: Family Medicine

## 2017-07-07 DIAGNOSIS — Z0289 Encounter for other administrative examinations: Secondary | ICD-10-CM

## 2017-07-11 ENCOUNTER — Telehealth: Payer: Self-pay | Admitting: Family Medicine

## 2017-07-11 NOTE — Telephone Encounter (Signed)
Patient dropped off FMLA to be filled out,please review date,sign in your box.

## 2017-07-13 NOTE — Telephone Encounter (Signed)
done

## 2017-07-19 ENCOUNTER — Ambulatory Visit: Payer: 59 | Admitting: Orthopaedic Surgery

## 2017-07-19 ENCOUNTER — Encounter: Payer: Self-pay | Admitting: Orthopaedic Surgery

## 2017-07-19 ENCOUNTER — Ambulatory Visit (INDEPENDENT_AMBULATORY_CARE_PROVIDER_SITE_OTHER): Payer: 59

## 2017-07-19 VITALS — BP 124/73 | HR 76 | Temp 97.7°F | Ht 71.0 in | Wt 204.0 lb

## 2017-07-19 DIAGNOSIS — G8929 Other chronic pain: Secondary | ICD-10-CM

## 2017-07-19 DIAGNOSIS — M25562 Pain in left knee: Secondary | ICD-10-CM
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
## 2017-07-25 ENCOUNTER — Ambulatory Visit
Admission: RE | Admit: 2017-07-25 | Discharge: 2017-07-25 | Disposition: A | Discharge status: Home / Self Care | Payer: 59 | Source: Ambulatory Visit | Attending: Orthopaedic Surgery | Admitting: Orthopaedic Surgery

## 2017-07-25 DIAGNOSIS — G8929 Other chronic pain: Secondary | ICD-10-CM

## 2017-07-25 DIAGNOSIS — M25562 Pain in left knee: Secondary | ICD-10-CM | POA: Diagnosis not present

## 2017-07-28 ENCOUNTER — Ambulatory Visit: Payer: 59 | Admitting: Orthopaedic Surgery

## 2017-07-28 ENCOUNTER — Encounter: Payer: Self-pay | Admitting: Orthopaedic Surgery

## 2017-07-28 VITALS — BP 117/70 | HR 63 | Ht 71.0 in | Wt 200.0 lb

## 2017-07-28 DIAGNOSIS — M25562 Pain in left knee: Secondary | ICD-10-CM

## 2017-07-28 DIAGNOSIS — G8929 Other chronic pain: Secondary | ICD-10-CM | POA: Diagnosis not present
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
## 2017-08-02 NOTE — Progress Notes (Signed)
done

## 2017-08-08 ENCOUNTER — Encounter: Payer: Self-pay | Admitting: Orthopedic Surgery

## 2017-08-08 ENCOUNTER — Ambulatory Visit: Payer: 59 | Admitting: Orthopedic Surgery

## 2017-08-08 VITALS — BP 136/72 | HR 66 | Ht 71.0 in | Wt 200.0 lb

## 2017-08-08 DIAGNOSIS — M23322 Other meniscus derangements, posterior horn of medial meniscus, left knee: Secondary | ICD-10-CM | POA: Diagnosis not present

## 2017-08-08 DIAGNOSIS — M1712 Unilateral primary osteoarthritis, left knee: Secondary | ICD-10-CM | POA: Diagnosis not present

## 2017-08-08 NOTE — Patient Instructions (Signed)
You have decided to proceed with knee replacement surgery. You have decided not to continue with nonoperative measures such as but not limited to oral medication, weight loss, activity modification, physical therapy, bracing, or injection.  We will perform the procedure commonly known as total knee replacement. Some of the risks associated with knee replacement surgery include but are not limited to Bleeding Infection Swelling Stiffness Blood clot Pain that persists even after surgery  Infection is especially devastating complication of knee surgery although rare. If infection does occur your implant will usually have to be removed and several surgeries and antibiotics will be needed to eradicate the infection prior to performing a repeat replacement.   In some cases amputation is required to eradicate the infection. In other rare cases a knee fusion is needed   In compliance with recent [place] law in federal regulation regarding opioid use and abuse and addiction, we will taper (stop) opioid medication after 6 weeks.  If you're not comfortable with these risks and would like to continue with nonoperative treatment please let Dr. Harrison know prior to your surgery.  
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
## 2017-08-11 ENCOUNTER — Encounter (HOSPITAL_COMMUNITY): Payer: Self-pay | Admitting: Emergency Medicine

## 2017-08-11 ENCOUNTER — Emergency Department (HOSPITAL_COMMUNITY)
Admission: EM | Admit: 2017-08-11 | Discharge: 2017-08-12 | Disposition: A | Discharge status: Home / Self Care | Payer: 59 | Attending: Emergency Medicine | Admitting: Emergency Medicine

## 2017-08-11 ENCOUNTER — Other Ambulatory Visit: Payer: Self-pay

## 2017-08-11 DIAGNOSIS — Z7901 Long term (current) use of anticoagulants: Secondary | ICD-10-CM | POA: Diagnosis not present

## 2017-08-11 DIAGNOSIS — E785 Hyperlipidemia, unspecified: Secondary | ICD-10-CM | POA: Insufficient documentation

## 2017-08-11 DIAGNOSIS — I481 Persistent atrial fibrillation: Secondary | ICD-10-CM | POA: Insufficient documentation

## 2017-08-11 DIAGNOSIS — Z79899 Other long term (current) drug therapy: Secondary | ICD-10-CM | POA: Insufficient documentation

## 2017-08-11 DIAGNOSIS — M25462 Effusion, left knee: Secondary | ICD-10-CM | POA: Diagnosis not present

## 2017-08-11 DIAGNOSIS — I10 Essential (primary) hypertension: Secondary | ICD-10-CM | POA: Diagnosis not present

## 2017-08-11 DIAGNOSIS — M25562 Pain in left knee: Secondary | ICD-10-CM | POA: Diagnosis not present

## 2017-08-11 MED ORDER — TRAMADOL HCL 50 MG PO TABS
100.0000 mg | ORAL_TABLET | Freq: Once | ORAL | Status: AC
  Administered 2017-08-12: 100 mg via ORAL
  Filled 2017-08-11: qty 2

## 2017-08-11 NOTE — ED Triage Notes (Signed)
Scheduled for knee surgery august 20th with dr Aline Brochure. Pt called Dr Adron Bene to advise about pain. Pt sent for pain management.

## 2017-08-12 ENCOUNTER — Ambulatory Visit: Payer: 59 | Admitting: Orthopedic Surgery

## 2017-08-12 ENCOUNTER — Emergency Department (HOSPITAL_COMMUNITY): Payer: 59

## 2017-08-12 ENCOUNTER — Encounter: Payer: Self-pay | Admitting: Orthopedic Surgery

## 2017-08-12 ENCOUNTER — Telehealth: Payer: Self-pay | Admitting: Radiology

## 2017-08-12 ENCOUNTER — Telehealth: Payer: Self-pay | Admitting: Internal Medicine

## 2017-08-12 VITALS — BP 115/73 | HR 67 | Ht 71.0 in

## 2017-08-12 DIAGNOSIS — M25462 Effusion, left knee: Secondary | ICD-10-CM | POA: Diagnosis not present

## 2017-08-12 MED ORDER — TRAMADOL HCL 50 MG PO TABS: 50.0000 mg | ORAL_TABLET | Freq: Four times a day (QID) | ORAL | 0 refills | Status: DC | PRN

## 2017-08-12 MED ORDER — TRAMADOL HCL 50 MG PO TABS: 100.0000 mg | ORAL_TABLET | Freq: Four times a day (QID) | ORAL | 0 refills | Status: DC | PRN

## 2017-08-12 NOTE — Telephone Encounter (Signed)
Left patient a message advising him that we have not received a clearance letter from anyone, and we are unaware of his upcoming procedure, and for him to contact office with necessary information so we can start the process.

## 2017-08-12 NOTE — Telephone Encounter (Signed)
Confirmation number for the lab pick up is [id number]

## 2017-08-12 NOTE — Patient Instructions (Signed)
I had a work note Thursday, July 25 through  Wednesday, July 31 return to work August 1  Ice  Rest  Ace wrap  Tramadol 50 mg every 6 hours for 7 days.

## 2017-08-12 NOTE — Telephone Encounter (Signed)
New Message:    Pt is checking on the status of his medical clearance to stop his Eliquis. He surgery is scheduled for 09-06-17.

## 2017-08-12 NOTE — Discharge Instructions (Addendum)
Elevate your leg. Use ice packs for the swelling. Use the ace wrap for comfort. Take the tramadol 2 tabs with acetaminophen 1000 mg 4 times a day for pain. Call Dr Ruthe Mannan office this morning to get an appointment to be seen on Tuesday, July 30. Return to the ED if you get a fever, redness of the skin around your knee or the pain gets worse.

## 2017-08-12 NOTE — Telephone Encounter (Signed)
Recieved call from patient stating Dr Arther Abbott is going to perform knee surgery and the office is closed today at noon.  I left a message with our name, number, and fax number; I advised them to give our office a call back with any questions.

## 2017-08-12 NOTE — Telephone Encounter (Signed)
We need a clearance request.  Kerin Ransom PA-C 08/12/2017 2:58 PM

## 2017-08-12 NOTE — Progress Notes (Addendum)
Progress Note   Patient ID: Anthony [name], male   DOB: 1955/10/17, 62 y.o.   MRN: [id number]   Chief Complaint  Patient presents with  . Follow-up    Recheck on left knee after ER visit on 07-12-17.    HPI  62 year old male on Eliquis prophylactically for heart arrhythmia went to the ER last night for knee pain The patient presents for evaluation of recent onset of swelling and increased pain left knee.  This patient is scheduled for left total knee in August on the 20th.  He went back to work after  seeing him on Monday.  Location left knee Duration 3 days Quality dull Severity so severe he cannot walk Associated with swelling loss of motion gait disturbance  Review of Systems  Constitutional: Negative for chills and fever.  Musculoskeletal: Positive for joint pain. Negative for back pain.  Neurological: Negative for sensory change.   No outpatient medications have been marked as taking for the 08/12/17 encounter (Office Visit) with Carole Civil, MD.   PLAN: (RX., injection, surgery,frx,mri/ct, XR 2 body ares) Aspiration left knee I did not inject steroid because he is having surgery knee replacement within a month  Procedure note  aspiration left knee joint  Verbal consent was obtained to aspirate left knee joint   Timeout was completed to confirm the site of aspiration   An 18-gauge needle was used to aspirate the left knee joint from a suprapatellar lateral approach.  Anesthesia was provided by ethyl chloride and the skin was prepped with alcohol.  After cleaning the skin with alcohol an 18-gauge needle was used to aspirate the right knee joint.  We obtained 50 cc of fluid  There were no complications. A sterile bandage was applied.  I did tell him to ice, rest, out of work until next Thursday, see Dr. Luna Glasgow and review labs next week  Meds ordered this encounter  Medications  . traMADol (ULTRAM) 50 MG tablet    Sig: Take 1 tablet (50 mg total) by mouth  every 6 (six) hours as needed for up to 5 days.    Dispense:  30 tablet    Refill:  0       Past Medical History:  Diagnosis Date  . Anxiety   . Chronic back pain    "related to scoliosis" (07/27/2016)  . GERD (gastroesophageal reflux disease)   . Headache    "BP related" (07/27/2016)  . History of kidney stones   . Hyperlipidemia   . Hypertension   . Insomnia   . Migraine headache    "0-3/week; 0-3/month; haven't had one in a little while" (07/27/2016)  . OSA on CPAP   . Persistent atrial fibrillation ([place])   . Scoliosis   . Sinusitis   . Venous stasis      Allergies  Allergen Reactions  . Spironolactone     Bilateral gynecomastia  . Codeine Other (See Comments)    Nightmares  . Contrast Media [Iodinated Diagnostic Agents] Hives    Needs pre meds  . Isovue [Iopamidol] Hives    Needs pre meds  . Lipitor [Atorvastatin] Other (See Comments)    Aches and pains  . Nsaids Other (See Comments)    Told not to take   . Zocor [Simvastatin] Other (See Comments)    Aches  . Acyclovir And Related Palpitations     BP 115/73   Pulse 67   Ht 5\' 11"  (1.803 m)   BMI 27.89 kg/m  Physical Exam  Constitutional: He is oriented to person, place, and time. He appears well-developed and well-nourished.  Vital signs have been reviewed and are stable. Gen. appearance the patient is well-developed and well-nourished with normal grooming and hygiene.   Musculoskeletal:       Right knee: He exhibits effusion.  Neurological: He is alert and oriented to person, place, and time. Gait abnormal.  Severe limp left knee and leg  Skin: Skin is warm and dry. No erythema.  Psychiatric: He has a normal mood and affect.  Vitals reviewed.   Right Knee Exam   Muscle Strength  The patient has normal right knee strength.  Tenderness  The patient is experiencing tenderness in the lateral joint line and medial joint line.  Range of Motion  Extension:  15 normal  Flexion:  90 normal    Tests  McMurray:  Medial - negative Lateral - negative Varus: negative Valgus: negative Drawer:  Anterior - negative    Posterior - negative  Other  Erythema: absent Scars: absent Sensation: normal Pulse: present Swelling: none Effusion: effusion present   Left Knee Exam   Muscle Strength  The patient has normal left knee strength.  Tenderness  The patient is experiencing no tenderness.   Range of Motion  Extension: normal  Flexion: normal   Tests  McMurray:  Medial - negative Lateral - negative Varus: negative Valgus: negative Drawer:  Anterior - negative     Posterior - negative  Other  Erythema: absent Scars: absent Sensation: normal Pulse: present Swelling: none      Arther Abbott, MD   MEDICAL DECISION MAKING   Imaging:  no  NEW PROBLEM  Encounter Diagnoses  Name Primary?  . Effusion of knee joint, left Yes  . Effusion, left knee      PLAN: (RX., injection, surgery,frx,mri/ct, XR 2 body ares) Aspiration left knee I did not inject steroid because he is having surgery knee replacement within a month  Procedure note  aspiration left knee joint  Verbal consent was obtained to aspirate left knee joint   Timeout was completed to confirm the site of aspiration   An 18-gauge needle was used to aspirate the left knee joint from a suprapatellar lateral approach.  Anesthesia was provided by ethyl chloride and the skin was prepped with alcohol.  After cleaning the skin with alcohol an 18-gauge needle was used to aspirate the right knee joint.  We obtained 50 cc of fluid  There were no complications. A sterile bandage was applied.  I did tell him to ice, rest, out of work until next Thursday, see Dr. Luna Glasgow and review labs next week  Meds ordered this encounter  Medications  . traMADol (ULTRAM) 50 MG tablet    Sig: Take 1 tablet (50 mg total) by mouth every 6 (six) hours as needed for up to 5 days.    Dispense:  30 tablet    Refill:   0   10:04 AM 08/12/2017
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
## 2017-08-13 LAB — SYNOVIAL CELL COUNT + DIFF, W/ CRYSTALS
Basophils, %: 0 %
Eosinophils-Synovial: 0 % (ref 0–2)
Lymphocytes-Synovial Fld: 0 % (ref 0–74)
Monocyte/Macrophage: 9 % (ref 0–69)
Neutrophil, Synovial: 91 % — ABNORMAL HIGH (ref 0–24)
Synoviocytes, %: 0 % (ref 0–15)
WBC, Synovial: 59340 cells/uL — ABNORMAL HIGH (ref <150)

## 2017-08-13 LAB — TIQ-NTM

## 2017-08-15 ENCOUNTER — Telehealth: Payer: Self-pay | Admitting: Orthopedic Surgery

## 2017-08-15 NOTE — Telephone Encounter (Signed)
Pt is for Lt total knee replacement.  09/06/17 by Dr. Aline Brochure   Dr. Aline Brochure would like to hold Eliquis 5 days prior to procedure, I will send this to our Pharmacy.    I talked with pt and on last visit there is a note of chest pain, pt stated today he has no further chest pain or SOB.  He feels well.  Would be able to climb 2 flights of steps but with knee pain he has difficulty now.  No Hx of CAD + a fib post ablation and SR on last visit. Low cardiac risk.

## 2017-08-15 NOTE — Telephone Encounter (Signed)
Patient called today, confirming appointment as noted with Dr Luna Glasgow 08/17/17, 8:40am, per Dr Aline Brochure, for review of lab results.  A call was received as well from [facility] regarding clearance. Per Arbie Cookey of their Pre-Op clinic pool, they have faxed a hard copy of the note indicating patient is cleared for surgery, from cardiac standpoint.

## 2017-08-15 NOTE — Telephone Encounter (Signed)
I s/w Arbie Cookey at [employer] and [facility] Medicine. Advised pt has been cleared. I will fax over a hard copy of clearance to fax # [phone]. Arbie Cookey thanked me for the call and our help.

## 2017-08-15 NOTE — Telephone Encounter (Signed)
See pharm note concerning anticoagulation below.     Primary Cardiologist: Thompson Grayer, MD  Chart reviewed as part of pre-operative protocol coverage. Given past medical history and time since last visit, based on ACC/AHA guidelines, Anthony [name] would be at acceptable risk for the planned procedure without further cardiovascular testing. He has low risk for cardiac event.   I will route this recommendation to the requesting party via Epic fax function and remove from pre-op pool.  Please call with questions.  Cecilie Kicks, NP 08/15/2017, 2:39 PM

## 2017-08-15 NOTE — Telephone Encounter (Signed)
New Message:      Karsten Fells that there is a letter in [place] pertaining to the questions we need answered. The letter was placed there on 08/08/17 by an amy Latrell. She states if you have any questions a nurse will be there this afternoon to assist you.

## 2017-08-15 NOTE — Telephone Encounter (Signed)
Pt takes Eliquis for afib with CHADS2VASc score of 1 (HTN). CrCl is 35mL/min. Eliquis will be cleared from the body within 1-2 days and will not need to hold 5 days prior. Recommend holding Eliquis for 3 days prior to TKA per protocol as this will provide sufficient washout prior to procedure.

## 2017-08-17 ENCOUNTER — Ambulatory Visit: Payer: 59 | Admitting: Orthopaedic Surgery

## 2017-08-17 ENCOUNTER — Encounter: Payer: Self-pay | Admitting: Orthopedic Surgery

## 2017-08-17 ENCOUNTER — Encounter: Payer: Self-pay | Admitting: Orthopaedic Surgery

## 2017-08-17 VITALS — BP 134/80 | HR 67 | Temp 98.9°F | Ht 71.0 in | Wt 202.0 lb

## 2017-08-17 DIAGNOSIS — M1712 Unilateral primary osteoarthritis, left knee: Secondary | ICD-10-CM | POA: Diagnosis not present

## 2017-08-17 DIAGNOSIS — M25462 Effusion, left knee: Secondary | ICD-10-CM | POA: Diagnosis not present

## 2017-08-17 DIAGNOSIS — M10062 Idiopathic gout, left knee: Secondary | ICD-10-CM

## 2017-08-17 DIAGNOSIS — M1A0620 Idiopathic chronic gout, left knee, without tophus (tophi): Secondary | ICD-10-CM

## 2017-08-17 LAB — URIC ACID: URIC ACID, SERUM: 9.9 mg/dL — AB (ref 4.0–8.0)

## 2017-08-17 MED ORDER — ALLOPURINOL 300 MG PO TABS: 300.0000 mg | ORAL_TABLET | Freq: Every day | ORAL | 5 refills | Status: DC

## 2017-08-17 MED ORDER — TRAMADOL HCL 50 MG PO TABS: ORAL_TABLET | ORAL | 1 refills | Status: DC
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
## 2017-08-17 NOTE — Patient Instructions (Signed)
Have lab work done prior to your next appointment

## 2017-08-19 ENCOUNTER — Telehealth: Payer: Self-pay | Admitting: Pharmacist

## 2017-08-19 NOTE — Telephone Encounter (Signed)
This clearance was already provided for 3 day Eliquis hold prior to procedure previously from phone note on 08/12/17. Will route to provider again.

## 2017-08-19 NOTE — Telephone Encounter (Signed)
-----   Message from Thompson Grayer, MD sent at 08/17/2017  9:03 PM EDT ----- Please follow-up with our protocol recommendation  Thanks   ----- Message ----- From: Carole Civil, MD Sent: 08/08/2017  12:23 PM To: Thompson Grayer, MD   Dear Dr. Rayann Heman  Anthony [name] has osteoarthritis of his knee and is planning a left total knee arthroplasty.  We have told him to stop his Eliquis 5 days prior to surgery.  If this is okay please let me know if we need to stop it sooner please let me know.

## 2017-08-23 NOTE — Telephone Encounter (Signed)
He has been cleared for surgery, and is posted for August 20th.  To you FYI   They did Recommend holding Eliquis for 3 days prior to TKA per protocol as this will provide sufficient washout prior to procedure.     *you asked for 5 days they prefer 3

## 2017-08-25 ENCOUNTER — Telehealth: Payer: Self-pay | Admitting: Radiology

## 2017-08-25 ENCOUNTER — Other Ambulatory Visit: Payer: Self-pay | Admitting: Orthopedic Surgery

## 2017-08-25 DIAGNOSIS — M1712 Unilateral primary osteoarthritis, left knee: Secondary | ICD-10-CM

## 2017-08-25 NOTE — Telephone Encounter (Signed)
Completed prior auth request via website.

## 2017-08-26 NOTE — Telephone Encounter (Signed)
His surgery needs to be changed to arthroscoic because he had a ton of wbc in his fluid

## 2017-08-29 ENCOUNTER — Ambulatory Visit: Payer: 59 | Admitting: Orthopedic Surgery

## 2017-08-29 ENCOUNTER — Telehealth: Payer: Self-pay | Admitting: Radiology

## 2017-08-29 VITALS — BP 129/66 | HR 77 | Ht 71.0 in | Wt 202.0 lb

## 2017-08-29 DIAGNOSIS — M10062 Idiopathic gout, left knee: Secondary | ICD-10-CM | POA: Diagnosis not present

## 2017-08-29 DIAGNOSIS — M25462 Effusion, left knee: Secondary | ICD-10-CM | POA: Diagnosis not present

## 2017-08-29 DIAGNOSIS — M1712 Unilateral primary osteoarthritis, left knee: Secondary | ICD-10-CM | POA: Diagnosis not present

## 2017-08-29 MED ORDER — ACETAMINOPHEN-CODEINE #3 300-30 MG PO TABS: 1.0000 | ORAL_TABLET | Freq: Four times a day (QID) | ORAL | 0 refills | Status: DC | PRN

## 2017-08-29 NOTE — Telephone Encounter (Signed)
He called today, his knee is very swollen again. I have asked for him to come in this afternoon  Hoyle Sauer has changed the procedure to knee arthroscopy  The lab did not run the culture, states they did not receive order, but it was sent AND the order was in the system.

## 2017-08-29 NOTE — Telephone Encounter (Signed)
Confirmation number for the lab pick up is [id number] I have sent culture swab and purple top tube with orders for culture and cell count synovial fluid.

## 2017-08-29 NOTE — Progress Notes (Signed)
Chief Complaint  Patient presents with  . Follow-up    Recheck on left knee, wants fluid drawn off.    62 year old male previously scheduled for total knee switched over to arthroscopy when fluid came back 59,000 white cells.  Subsequent fluid analysis showed crystals and he was treated for gout with allopurinol and Uloric.  However, he comes in today with acute increase in pain swelling decreased range of motion without fever chills nausea or fatigue  There is no erythema to the knee although he says it does at times feel warm to touch  Review of systems as stated  Exam of the knee shows that the knee is swollen with a tense effusion there is no erythema or warmth to the knee he cannot fully extend or fully flex the knee but has approximately 90 degrees of flexion passively  His knee is stable he has tenderness over the medial lateral joint lines and also in the synovial areas around the knee joint  He is neurovascularly intact he is ambulatory with a slight limp  I attempted aspiration of the knee had to do 2 attempts at aspiration I did get back some fluid which decompressed his knee although it was not a lot of fluid as expected  We sent fluid for culture and crystal analysis  He will be given his results by phone we plan to do an arthroscopic lavage on the 20th.  He will continue his gout medicine and I changed his pain medication  Encounter Diagnoses  Name Primary?  . Effusion of knee joint, left Yes  . Acute idiopathic gout of left knee   . Primary osteoarthritis of left knee     Effusion left knee joint aspiration  Patient consented for aspiration left knee  Timeout completed to confirm injection aspiration site  Knee cleaned with alcohol and sprayed with ethyl chloride first attempt got back to 3 cc of fluid second attempt got back approximately 15 cc of fluid after injecting with lidocaine  Patient did get some relief  Cultures and fluid sent for  analysis  Allergies  Allergen Reactions  . Spironolactone     Bilateral gynecomastia  . Codeine Other (See Comments)    Nightmares  . Contrast Media [Iodinated Diagnostic Agents] Hives    Needs pre meds  . Isovue [Iopamidol] Hives    Needs pre meds  . Lipitor [Atorvastatin] Other (See Comments)    Aches and pains  . Nsaids Other (See Comments)    Told not to take   . Zocor [Simvastatin] Other (See Comments)    Aches  . Acyclovir And Related Palpitations   Meds ordered this encounter  Medications  . DISCONTD: acetaminophen-codeine (TYLENOL #3) 300-30 MG tablet    Sig: Take 1 tablet by mouth every 6 (six) hours as needed for moderate pain.    Dispense:  30 tablet    Refill:  0  . acetaminophen-codeine (TYLENOL #3) 300-30 MG tablet    Sig: Take 1 tablet by mouth every 6 (six) hours as needed for moderate pain.    Dispense:  30 tablet    Refill:  0     No orders of the defined types were placed in this encounter.

## 2017-08-30 NOTE — Patient Instructions (Signed)
Anthony [name]  08/30/2017     @PREFPERIOPPHARMACY @   Your procedure is scheduled on  09/06/2017   Report to [facility] at  845   A.M.  Call this number if you have problems the morning of surgery:  [phone]   Remember:  Do not eat or drink after midnight.  You may drink clear liquids until  12 midnight 09/05/2017.  Clear liquids allowed are:                    Water, Juice (non-citric and without pulp), Carbonated beverages, Clear Tea, Black Coffee only, Plain Jell-O only, Gatorade and Plain Popsicles only    Take these medicines the morning of surgery with A SIP OF WATER  Alloopurinol, lexapro, lisinopril, claritin, metoprolol, protonix, imitrex( if needed), ultram, verapamil.  FOLLOW instructions given to you by Dr Ruthe Mannan office.    Do not wear jewelry, make-up or nail polish.  Do not wear lotions, powders, or perfumes, or deodorant.  Do not shave 48 hours prior to surgery.  Men may shave face and neck.  Do not bring valuables to the hospital.  [facility] is not responsible for any belongings or valuables.  Contacts, dentures or bridgework may not be worn into surgery.  Leave your suitcase in the car.  After surgery it may be brought to your room.  For patients admitted to the hospital, discharge time will be determined by your treatment team.  Patients discharged the day of surgery will not be allowed to drive home.   Name and phone number of your driver:   family Special instructions:  None  Please read over the following fact sheets that you were given. Anesthesia Post-op Instructions and Care and Recovery After Surgery      Knee Ligament Injury, Arthroscopy Arthroscopy is a surgical technique in which your health care provider examines your knee through a small, pencil-sized telescope (arthroscope). Often, repairs to injured ligaments can be done with instruments in the arthroscope. Arthroscopy is less invasive than open-knee surgery. Tell a  health care provider about:  Any allergies you have.  All medicines you are taking, including vitamins, herbs, eye drops, creams, and over-the-counter medicines.  Any problems you or family members have had with anesthetic medicines.  Any blood disorders you have.  Any surgeries you have had.  Any medical conditions you have. What are the risks? Generally, this is a safe procedure. However, as with any procedure, problems can occur. Possible problems include:  Infection.  Bleeding.  Stiffness.  What happens before the procedure?  Ask your health care provider about changing or stopping any regular medicines. Avoid taking aspirin or blood thinners as directed by your health care provider.  Do not eat or drink anything after midnight the night before surgery.  If you smoke, do not smoke for at least 2 weeks before your surgery.  Do not drink alcohol starting the day before your surgery.  Let your health care provider know if you develop a cold or any infection before your surgery.  Arrange for someone to drive you home after the surgery or after your hospital stay. Also arrange for someone to help you with activities during recovery. What happens during the procedure?  Small monitors will be put on your body. They are used to check your heart, blood pressure, and oxygen levels.  An IV access tube will be put into one of your veins. Medicine will  be able to flow directly into your body through this IV tube.  You might be given a medicine to help you relax (sedative).  You will be given a medicine that makes you go to sleep (general anesthetic), and a breathing tube will be placed into your lungs during the procedure.  Several small incisions are made in your knee. Saline fluid is placed into one of the incisions to expand the knee and clear away any blood in the knee.  Your health care provider will insert the arthroscope to examine the injured knee.  During arthroscopy,  your health care provider may find a partial or complete tear in a ligament.  Tools can be inserted through the other incisions to repair the injured ligaments.  The incisions are then closed with absorbable stitches and covered with dressings. What happens after the procedure?  You will be taken to the recovery area where you will be monitored.  When you are awake, stable, and taking fluids without problems, you will be allowed to go home. This information is not intended to replace advice given to you by your health care provider. Make sure you discuss any questions you have with your health care provider. Document Released: 01/02/2000 Document Revised: 06/12/2015 Document Reviewed: 08/16/2012 Elsevier Interactive Patient Education  2017 [place].  Arthroscopic Knee Ligament Repair, Care After This sheet gives you information about how to care for yourself after your procedure. Your health care provider may also give you more specific instructions. If you have problems or questions, contact your health care provider. What can I expect after the procedure? After the procedure, it is common to have:  Pain in your knee.  Bruising and swelling on your knee, calf, and ankle for 3-4 days.  Fatigue.  Follow these instructions at home: If you have a brace or immobilizer:  Wear the brace or immobilizer as told by your health care provider. Remove it only as told by your health care provider.  Loosen the splint or immobilizer if your toes tingle, become numb, or turn cold and blue.  Keep the brace or immobilizer clean. Bathing  Do not take baths, swim, or use a hot tub until your health care provider approves. Ask your health care provider if you can take showers.  Keep your bandage (dressing) dry until your health care provider says that it can be removed. Cover it and your brace or immobilizer with a watertight covering when you take a shower. Incision care  Follow instructions  from your health care provider about how to take care of your incision. Make sure you: ? Wash your hands with soap and water before you change your bandage (dressing). If soap and water are not available, use hand sanitizer. ? Change your dressing as told by your health care provider. ? Leave stitches (sutures), skin glue, or adhesive strips in place. These skin closures may need to stay in place for 2 weeks or longer. If adhesive strip edges start to loosen and curl up, you may trim the loose edges. Do not remove adhesive strips completely unless your health care provider tells you to do that.  Check your incision area every day for signs of infection. Check for: ? More redness, swelling, or pain. ? More fluid or blood. ? Warmth. ? Pus or a bad smell. Managing pain, stiffness, and swelling  If directed, put ice on the affected area. ? If you have a removable brace or immobilizer, remove it as told by your health care  provider. ? Put ice in a plastic bag. ? Place a towel between your skin and the bag or between your brace or immobilizer and the bag. ? Leave the ice on for 20 minutes, 2-3 times a day.  Move your toes often to avoid stiffness and to lessen swelling.  Raise (elevate) the injured area above the level of your heart while you are sitting or lying down. Driving  Do not drive until your health care provider approves. If you have a brace or immobilizer on your leg, ask your health care provider when it is safe for you to drive.  Do not drive or use heavy machinery while taking prescription pain medicine. Activity  Rest as directed. Ask your health care provider what activities are safe for you.  Do physical therapy exercises as told by your health care provider. Physical therapy will help you regain strength and motion in your knee.  Follow instructions from your health care provider about: ? When you may start motion exercises. ? When you may start riding a stationary bike  and doing other low-impact activities. ? When you may start to jog and do other high-impact activities. Safety  Do not use the injured limb to support your body weight until your health care provider says that you can. Use crutches as told by your health care provider. General instructions  Do not use any products that contain nicotine or tobacco, such as cigarettes and e-cigarettes. These can delay bone healing. If you need help quitting, ask your health care provider.  To prevent or treat constipation while you are taking prescription pain medicine, your health care provider may recommend that you: ? Drink enough fluid to keep your urine clear or pale yellow. ? Take over-the-counter or prescription medicines. ? Eat foods that are high in fiber, such as fresh fruits and vegetables, whole grains, and beans. ? Limit foods that are high in fat and processed sugars, such as fried and sweet foods.  Take over-the-counter and prescription medicines only as told by your health care provider.  Keep all follow-up visits as told by your health care provider. This is important. Contact a health care provider if:  You have more redness, swelling, or pain around an incision.  You have more fluid or blood coming from an incision.  Your incision feels warm to the touch.  You have a fever.  You have pain or swelling in your knee, and it gets worse.  You have pain that does not get better with medicine. Get help right away if:  You have trouble breathing.  You have pus or a bad smell coming from an incision.  You have numbness and tingling near the knee joint. Summary  After the procedure, it is common to have knee pain with bruising and swelling on your knee, calf, and ankle.  Icing your knee and raising your leg above the level of your heart will help control the pain and the swelling.  Do physical therapy exercises as told by your health care provider. Physical therapy will help you  regain strength and motion in your knee. This information is not intended to replace advice given to you by your health care provider. Make sure you discuss any questions you have with your health care provider. Document Released: 10/25/2012 Document Revised: 12/30/2015 Document Reviewed: 12/30/2015 Elsevier Interactive Patient Education  2017 [place] Anesthesia, Adult General anesthesia is the use of medicines to make a person "go to sleep" (be unconscious) for  a medical procedure. General anesthesia is often recommended when a procedure:  Is long.  Requires you to be still or in an unusual position.  Is major and can cause you to lose blood.  Is impossible to do without general anesthesia.  The medicines used for general anesthesia are called general anesthetics. In addition to making you sleep, the medicines:  Prevent pain.  Control your blood pressure.  Relax your muscles.  Tell a health care provider about:  Any allergies you have.  All medicines you are taking, including vitamins, herbs, eye drops, creams, and over-the-counter medicines.  Any problems you or family members have had with anesthetic medicines.  Types of anesthetics you have had in the past.  Any bleeding disorders you have.  Any surgeries you have had.  Any medical conditions you have.  Any history of heart or lung conditions, such as heart failure, sleep apnea, or chronic obstructive pulmonary disease (COPD).  Whether you are pregnant or may be pregnant.  Whether you use tobacco, alcohol, marijuana, or street drugs.  Any history of Armed forces logistics/support/administrative officer.  Any history of depression or anxiety. What are the risks? Generally, this is a safe procedure. However, problems may occur, including:  Allergic reaction to anesthetics.  Lung and heart problems.  Inhaling food or liquids from your stomach into your lungs (aspiration).  Injury to nerves.  Waking up during your procedure and  being unable to move (rare).  Extreme agitation or a state of mental confusion (delirium) when you wake up from the anesthetic.  Air in the bloodstream, which can lead to stroke.  These problems are more likely to develop if you are having a major surgery or if you have an advanced medical condition. You can prevent some of these complications by answering all of your health care provider's questions thoroughly and by following all pre-procedure instructions. General anesthesia can cause side effects, including:  Nausea or vomiting  A sore throat from the breathing tube.  Feeling cold or shivery.  Feeling tired, washed out, or achy.  Sleepiness or drowsiness.  Confusion or agitation.  What happens before the procedure? Staying hydrated Follow instructions from your health care provider about hydration, which may include:  Up to 2 hours before the procedure - you may continue to drink clear liquids, such as water, clear fruit juice, black coffee, and plain tea.  Eating and drinking restrictions Follow instructions from your health care provider about eating and drinking, which may include:  8 hours before the procedure - stop eating heavy meals or foods such as meat, fried foods, or fatty foods.  6 hours before the procedure - stop eating light meals or foods, such as toast or cereal.  6 hours before the procedure - stop drinking milk or drinks that contain milk.  2 hours before the procedure - stop drinking clear liquids.  Medicines  Ask your health care provider about: ? Changing or stopping your regular medicines. This is especially important if you are taking diabetes medicines or blood thinners. ? Taking medicines such as aspirin and ibuprofen. These medicines can thin your blood. Do not take these medicines before your procedure if your health care provider instructs you not to. ? Taking new dietary supplements or medicines. Do not take these during the week before  your procedure unless your health care provider approves them.  If you are told to take a medicine or to continue taking a medicine on the day of the procedure, take the medicine with  sips of water. General instructions   Ask if you will be going home the same day, the following day, or after a longer hospital stay. ? Plan to have someone take you home. ? Plan to have someone stay with you for the first 24 hours after you leave the hospital or clinic.  For 3-6 weeks before the procedure, try not to use any tobacco products, such as cigarettes, chewing tobacco, and e-cigarettes.  You may brush your teeth on the morning of the procedure, but make sure to spit out the toothpaste. What happens during the procedure?  You will be given anesthetics through a mask and through an IV tube in one of your veins.  You may receive medicine to help you relax (sedative).  As soon as you are asleep, a breathing tube may be used to help you breathe.  An anesthesia specialist will stay with you throughout the procedure. He or she will help keep you comfortable and safe by continuing to give you medicines and adjusting the amount of medicine that you get. He or she will also watch your blood pressure, pulse, and oxygen levels to make sure that the anesthetics do not cause any problems.  If a breathing tube was used to help you breathe, it will be removed before you wake up. The procedure may vary among health care providers and hospitals. What happens after the procedure?  You will wake up, often slowly, after the procedure is complete, usually in a recovery area.  Your blood pressure, heart rate, breathing rate, and blood oxygen level will be monitored until the medicines you were given have worn off.  You may be given medicine to help you calm down if you feel anxious or agitated.  If you will be going home the same day, your health care provider may check to make sure you can stand, drink, and  urinate.  Your health care providers will treat your pain and side effects before you go home.  Do not drive for 24 hours if you received a sedative.  You may: ? Feel nauseous and vomit. ? Have a sore throat. ? Have mental slowness. ? Feel cold or shivery. ? Feel sleepy. ? Feel tired. ? Feel sore or achy, even in parts of your body where you did not have surgery. This information is not intended to replace advice given to you by your health care provider. Make sure you discuss any questions you have with your health care provider. Document Released: 04/13/2007 Document Revised: 06/17/2015 Document Reviewed: 12/19/2014 Elsevier Interactive Patient Education  2018 [place] Anesthesia, Adult, Care After These instructions provide you with information about caring for yourself after your procedure. Your health care provider may also give you more specific instructions. Your treatment has been planned according to current medical practices, but problems sometimes occur. Call your health care provider if you have any problems or questions after your procedure. What can I expect after the procedure? After the procedure, it is common to have:  Vomiting.  A sore throat.  Mental slowness.  It is common to feel:  Nauseous.  Cold or shivery.  Sleepy.  Tired.  Sore or achy, even in parts of your body where you did not have surgery.  Follow these instructions at home: For at least 24 hours after the procedure:  Do not: ? Participate in activities where you could fall or become injured. ? Drive. ? Use heavy machinery. ? Drink alcohol. ? Take sleeping pills or medicines  that cause drowsiness. ? Make important decisions or sign legal documents. ? Take care of children on your own.  Rest. Eating and drinking  If you vomit, drink water, juice, or soup when you can drink without vomiting.  Drink enough fluid to keep your urine clear or pale yellow.  Make sure you  have little or no nausea before eating solid foods.  Follow the diet recommended by your health care provider. General instructions  Have a responsible adult stay with you until you are awake and alert.  Return to your normal activities as told by your health care provider. Ask your health care provider what activities are safe for you.  Take over-the-counter and prescription medicines only as told by your health care provider.  If you smoke, do not smoke without supervision.  Keep all follow-up visits as told by your health care provider. This is important. Contact a health care provider if:  You continue to have nausea or vomiting at home, and medicines are not helpful.  You cannot drink fluids or start eating again.  You cannot urinate after 8-12 hours.  You develop a skin rash.  You have fever.  You have increasing redness at the site of your procedure. Get help right away if:  You have difficulty breathing.  You have chest pain.  You have unexpected bleeding.  You feel that you are having a life-threatening or urgent problem. This information is not intended to replace advice given to you by your health care provider. Make sure you discuss any questions you have with your health care provider. Document Released: 04/12/2000 Document Revised: 06/09/2015 Document Reviewed: 12/19/2014 Elsevier Interactive Patient Education  [employer].

## 2017-09-02 ENCOUNTER — Encounter (HOSPITAL_COMMUNITY): Payer: Self-pay

## 2017-09-02 ENCOUNTER — Other Ambulatory Visit: Payer: Self-pay

## 2017-09-02 ENCOUNTER — Inpatient Hospital Stay (HOSPITAL_COMMUNITY): Admission: RE | Admit: 2017-09-02 | Payer: Self-pay | Source: Ambulatory Visit

## 2017-09-02 ENCOUNTER — Telehealth: Payer: Self-pay | Admitting: Orthopedic Surgery

## 2017-09-02 ENCOUNTER — Encounter (HOSPITAL_COMMUNITY)
Admission: RE | Admit: 2017-09-02 | Discharge: 2017-09-02 | Disposition: A | Discharge status: Home / Self Care | Payer: 59 | Source: Ambulatory Visit | Attending: Orthopedic Surgery | Admitting: Orthopedic Surgery

## 2017-09-02 DIAGNOSIS — M1712 Unilateral primary osteoarthritis, left knee: Secondary | ICD-10-CM | POA: Insufficient documentation

## 2017-09-02 DIAGNOSIS — Z01818 Encounter for other preprocedural examination: Secondary | ICD-10-CM | POA: Diagnosis not present

## 2017-09-02 HISTORY — DX: Gout, unspecified: M10.9

## 2017-09-02 LAB — BASIC METABOLIC PANEL
Anion gap: 9 (ref 5–15)
BUN: 22 mg/dL (ref 8–23)
CALCIUM: 9.3 mg/dL (ref 8.9–10.3)
CO2: 26 mmol/L (ref 22–32)
CREATININE: 1.4 mg/dL — AB (ref 0.61–1.24)
Chloride: 103 mmol/L (ref 98–111)
GFR calc Af Amer: >60 mL/min (ref >60)
GFR, EST NON AFRICAN AMERICAN: 53 mL/min — AB (ref >60)
Glucose, Bld: 119 mg/dL — ABNORMAL HIGH (ref 70–99)
POTASSIUM: 3.5 mmol/L (ref 3.5–5.1)
SODIUM: 138 mmol/L (ref 135–145)

## 2017-09-02 LAB — CBC WITH DIFFERENTIAL/PLATELET
BASOS ABS: 0 10*3/uL (ref 0.0–0.1)
BASOS PCT: 0 %
EOS ABS: 0.2 10*3/uL (ref 0.0–0.7)
Eosinophils Relative: 2 %
HCT: 38.3 % — ABNORMAL LOW (ref 39.0–52.0)
Hemoglobin: 13.1 g/dL (ref 13.0–17.0)
Lymphocytes Relative: 26 %
Lymphs Abs: 2.4 10*3/uL (ref 0.7–4.0)
MCH: 31 pg (ref 26.0–34.0)
MCHC: 34.2 g/dL (ref 30.0–36.0)
MCV: 90.5 fL (ref 78.0–100.0)
MONO ABS: 0.8 10*3/uL (ref 0.1–1.0)
Monocytes Relative: 9 %
Neutro Abs: 5.8 10*3/uL (ref 1.7–7.7)
Neutrophils Relative %: 63 %
PLATELETS: 287 10*3/uL (ref 150–400)
RBC: 4.23 MIL/uL (ref 4.22–5.81)
RDW: 12 % (ref 11.5–15.5)
WBC: 9.2 10*3/uL (ref 4.0–10.5)

## 2017-09-02 NOTE — Telephone Encounter (Signed)
Patient called wanting to let Dr. Aline Brochure know that since he drew the fluid out of his knee,he is having problems with his foot. It is swollen and can't put weight on it. States that it is very sensitive to the touch. He is scheduled to do his pre-op this morning.   Please call and advise

## 2017-09-02 NOTE — Telephone Encounter (Signed)
Elevate the leg rest for 48 hrs

## 2017-09-02 NOTE — Telephone Encounter (Signed)
Thank you I have called to advise.

## 2017-09-04 LAB — ANAEROBIC AND AEROBIC CULTURE
AER RESULT: NO GROWTH
MICRO NUMBER: 90954942
MICRO NUMBER:: 90954943
SPECIMEN QUALITY: ADEQUATE
SPECIMEN QUALITY:: ADEQUATE

## 2017-09-04 LAB — SYNOVIAL CELL COUNT + DIFF, W/ CRYSTALS
Basophils, %: 0 %
EOSINOPHILS-SYNOVIAL: 0 % (ref 0–2)
Lymphocytes-Synovial Fld: 3 % (ref 0–74)
MONOCYTE/MACROPHAGE: 3 % (ref 0–69)
Neutrophil, Synovial: 94 % — ABNORMAL HIGH (ref 0–24)
SYNOVIOCYTES, %: 0 % (ref 0–15)
WBC, SYNOVIAL: 20230 {cells}/uL — AB (ref <150)

## 2017-09-05 ENCOUNTER — Telehealth: Payer: Self-pay | Admitting: Orthopedic Surgery

## 2017-09-05 NOTE — Telephone Encounter (Signed)
He is not having a TKR he is having a Knee arthroscopy, which does not require prior authorization.   I called to advise left message   But looking now, it may actually be required, previously posted as TKR , so have submitted documentation to approve for arthroscopy   [id number] is pending case number.

## 2017-09-05 NOTE — Telephone Encounter (Signed)
Can you give her my pending case number ? I called x 2 no answers unable to hold, I am in clinic

## 2017-09-05 NOTE — Telephone Encounter (Signed)
Susie from [employer] called asking for authorization for patient's scheduled procedure on 09/06/17.  Please call Susie at [phone] ext. [phone].

## 2017-09-05 NOTE — Telephone Encounter (Signed)
I called her again to advise, she has the information, it has been approved.
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
## 2017-09-05 NOTE — Telephone Encounter (Signed)
Susie from [employer] called and left message stating she needs pre authorization for TKA on this patient for tomorrow  Please call Susie at [phone] ext. [phone]  Thanks

## 2017-09-06 ENCOUNTER — Ambulatory Visit (HOSPITAL_COMMUNITY): Payer: 59 | Admitting: Anesthesiology

## 2017-09-06 ENCOUNTER — Ambulatory Visit (HOSPITAL_COMMUNITY)
Admission: RE | Admit: 2017-09-06 | Discharge: 2017-09-06 | Disposition: A | Discharge status: Home / Self Care | Payer: 59 | Source: Ambulatory Visit | Attending: Orthopedic Surgery | Admitting: Orthopedic Surgery

## 2017-09-06 ENCOUNTER — Encounter (HOSPITAL_COMMUNITY): Payer: Self-pay

## 2017-09-06 ENCOUNTER — Encounter (HOSPITAL_COMMUNITY)
Admission: RE | Disposition: A | Discharge status: Home / Self Care | Payer: Self-pay | Source: Ambulatory Visit | Attending: Orthopedic Surgery

## 2017-09-06 ENCOUNTER — Telehealth: Payer: Self-pay | Admitting: Orthopedic Surgery

## 2017-09-06 DIAGNOSIS — Z87442 Personal history of urinary calculi: Secondary | ICD-10-CM | POA: Insufficient documentation

## 2017-09-06 DIAGNOSIS — G4700 Insomnia, unspecified: Secondary | ICD-10-CM | POA: Insufficient documentation

## 2017-09-06 DIAGNOSIS — M65962 Unspecified synovitis and tenosynovitis, left lower leg: Secondary | ICD-10-CM

## 2017-09-06 DIAGNOSIS — K219 Gastro-esophageal reflux disease without esophagitis: Secondary | ICD-10-CM | POA: Insufficient documentation

## 2017-09-06 DIAGNOSIS — M65862 Other synovitis and tenosynovitis, left lower leg: Secondary | ICD-10-CM | POA: Insufficient documentation

## 2017-09-06 DIAGNOSIS — Z79899 Other long term (current) drug therapy: Secondary | ICD-10-CM | POA: Diagnosis not present

## 2017-09-06 DIAGNOSIS — M419 Scoliosis, unspecified: Secondary | ICD-10-CM | POA: Insufficient documentation

## 2017-09-06 DIAGNOSIS — F419 Anxiety disorder, unspecified: Secondary | ICD-10-CM | POA: Insufficient documentation

## 2017-09-06 DIAGNOSIS — G4733 Obstructive sleep apnea (adult) (pediatric): Secondary | ICD-10-CM | POA: Insufficient documentation

## 2017-09-06 DIAGNOSIS — Y929 Unspecified place or not applicable: Secondary | ICD-10-CM | POA: Diagnosis not present

## 2017-09-06 DIAGNOSIS — X58XXXA Exposure to other specified factors, initial encounter: Secondary | ICD-10-CM | POA: Diagnosis not present

## 2017-09-06 DIAGNOSIS — Z7902 Long term (current) use of antithrombotics/antiplatelets: Secondary | ICD-10-CM | POA: Insufficient documentation

## 2017-09-06 DIAGNOSIS — M659 Synovitis and tenosynovitis, unspecified: Secondary | ICD-10-CM | POA: Diagnosis not present

## 2017-09-06 DIAGNOSIS — I10 Essential (primary) hypertension: Secondary | ICD-10-CM | POA: Insufficient documentation

## 2017-09-06 DIAGNOSIS — M23322 Other meniscus derangements, posterior horn of medial meniscus, left knee: Secondary | ICD-10-CM

## 2017-09-06 DIAGNOSIS — M23304 Other meniscus derangements, unspecified medial meniscus, left knee: Secondary | ICD-10-CM | POA: Diagnosis not present

## 2017-09-06 DIAGNOSIS — M109 Gout, unspecified: Secondary | ICD-10-CM | POA: Diagnosis not present

## 2017-09-06 DIAGNOSIS — E785 Hyperlipidemia, unspecified: Secondary | ICD-10-CM | POA: Insufficient documentation

## 2017-09-06 DIAGNOSIS — I481 Persistent atrial fibrillation: Secondary | ICD-10-CM | POA: Diagnosis not present

## 2017-09-06 DIAGNOSIS — S83242A Other tear of medial meniscus, current injury, left knee, initial encounter: Secondary | ICD-10-CM | POA: Diagnosis not present

## 2017-09-06 DIAGNOSIS — Z886 Allergy status to analgesic agent status: Secondary | ICD-10-CM | POA: Diagnosis not present

## 2017-09-06 HISTORY — PX: KNEE ARTHROSCOPY WITH MEDIAL MENISECTOMY: SHX5651

## 2017-09-06 SURGERY — ARTHROSCOPY, KNEE
Anesthesia: Choice | Laterality: Left

## 2017-09-06 SURGERY — ARTHROSCOPY, KNEE, WITH MEDIAL MENISCECTOMY
Anesthesia: General | Laterality: Left

## 2017-09-06 MED ORDER — PROMETHAZINE HCL 25 MG/ML IJ SOLN: 6.2500 mg | INTRAMUSCULAR | Status: DC | PRN

## 2017-09-06 MED ORDER — EPINEPHRINE PF 1 MG/ML IJ SOLN
INTRAMUSCULAR | Status: AC
  Filled 2017-09-06: qty 4

## 2017-09-06 MED ORDER — FENTANYL CITRATE (PF) 100 MCG/2ML IJ SOLN
INTRAMUSCULAR | Status: DC | PRN
  Administered 2017-09-06: 25 ug via INTRAVENOUS
  Administered 2017-09-06: 50 ug via INTRAVENOUS
  Administered 2017-09-06 (×3): 25 ug via INTRAVENOUS
  Administered 2017-09-06: 50 ug via INTRAVENOUS
  Administered 2017-09-06 (×2): 25 ug via INTRAVENOUS

## 2017-09-06 MED ORDER — PHENYLEPHRINE HCL 10 MG/ML IJ SOLN
INTRAMUSCULAR | Status: DC | PRN
  Administered 2017-09-06: 40 ug via INTRAVENOUS

## 2017-09-06 MED ORDER — SODIUM CHLORIDE 0.9 % IR SOLN
Status: DC | PRN
  Administered 2017-09-06 (×3): 3000 mL

## 2017-09-06 MED ORDER — LIDOCAINE HCL (CARDIAC) PF 50 MG/5ML IV SOSY
PREFILLED_SYRINGE | INTRAVENOUS | Status: DC | PRN
  Administered 2017-09-06: 40 mg via INTRAVENOUS

## 2017-09-06 MED ORDER — SUCCINYLCHOLINE CHLORIDE 20 MG/ML IJ SOLN
INTRAMUSCULAR | Status: AC
  Filled 2017-09-06: qty 1

## 2017-09-06 MED ORDER — BUPIVACAINE-EPINEPHRINE (PF) 0.5% -1:200000 IJ SOLN
INTRAMUSCULAR | Status: DC | PRN
  Administered 2017-09-06 (×2): 30 mL

## 2017-09-06 MED ORDER — EPHEDRINE SULFATE 50 MG/ML IJ SOLN
INTRAMUSCULAR | Status: AC
  Filled 2017-09-06: qty 1

## 2017-09-06 MED ORDER — EPHEDRINE SULFATE 50 MG/ML IJ SOLN
INTRAMUSCULAR | Status: DC | PRN
  Administered 2017-09-06 (×2): 5 mg via INTRAVENOUS

## 2017-09-06 MED ORDER — COLCHICINE 0.6 MG PO TABS: 0.6000 mg | ORAL_TABLET | Freq: Two times a day (BID) | ORAL | 2 refills | Status: DC

## 2017-09-06 MED ORDER — FENTANYL CITRATE (PF) 250 MCG/5ML IJ SOLN
INTRAMUSCULAR | Status: AC
  Filled 2017-09-06: qty 5

## 2017-09-06 MED ORDER — METHYLPREDNISOLONE SODIUM SUCC 40 MG IJ SOLR
40.0000 mg | Freq: Once | INTRAMUSCULAR | Status: AC
  Administered 2017-09-06: 40 mg via INTRAVENOUS
  Filled 2017-09-06: qty 1

## 2017-09-06 MED ORDER — ONDANSETRON HCL 4 MG/2ML IJ SOLN
4.0000 mg | Freq: Once | INTRAMUSCULAR | Status: AC
  Administered 2017-09-06: 4 mg via INTRAVENOUS
  Filled 2017-09-06: qty 2

## 2017-09-06 MED ORDER — LIDOCAINE HCL (PF) 1 % IJ SOLN
INTRAMUSCULAR | Status: AC
  Filled 2017-09-06: qty 5

## 2017-09-06 MED ORDER — LACTATED RINGERS IV SOLN: INTRAVENOUS | Status: DC

## 2017-09-06 MED ORDER — SODIUM CHLORIDE 0.9 % IR SOLN
Status: DC | PRN
  Administered 2017-09-06: 1000 mL

## 2017-09-06 MED ORDER — HYDROMORPHONE HCL 1 MG/ML IJ SOLN
0.2500 mg | INTRAMUSCULAR | Status: DC | PRN
  Administered 2017-09-06 (×2): 0.5 mg via INTRAVENOUS
  Filled 2017-09-06 (×2): qty 0.5

## 2017-09-06 MED ORDER — SODIUM CHLORIDE 0.9 % IJ SOLN
INTRAMUSCULAR | Status: AC
  Filled 2017-09-06: qty 10

## 2017-09-06 MED ORDER — PROPOFOL 10 MG/ML IV BOLUS
INTRAVENOUS | Status: DC | PRN
  Administered 2017-09-06: 180 mg via INTRAVENOUS

## 2017-09-06 MED ORDER — BUPIVACAINE-EPINEPHRINE (PF) 0.5% -1:200000 IJ SOLN
INTRAMUSCULAR | Status: AC
  Filled 2017-09-06: qty 60

## 2017-09-06 MED ORDER — HYDROCODONE-ACETAMINOPHEN 5-325 MG PO TABS: 1.0000 | ORAL_TABLET | ORAL | 0 refills | Status: DC | PRN

## 2017-09-06 MED ORDER — ACETAMINOPHEN 500 MG PO TABS
1000.0000 mg | ORAL_TABLET | Freq: Once | ORAL | Status: AC
  Administered 2017-09-06: 1000 mg via ORAL
  Filled 2017-09-06: qty 2

## 2017-09-06 MED ORDER — MEPERIDINE HCL 50 MG/ML IJ SOLN: 6.2500 mg | INTRAMUSCULAR | Status: DC | PRN

## 2017-09-06 MED ORDER — CEFAZOLIN SODIUM-DEXTROSE 2-4 GM/100ML-% IV SOLN
2.0000 g | INTRAVENOUS | Status: AC
  Administered 2017-09-06: 2 g via INTRAVENOUS
  Filled 2017-09-06: qty 100

## 2017-09-06 MED ORDER — TRAMADOL HCL 50 MG PO TABS
50.0000 mg | ORAL_TABLET | Freq: Four times a day (QID) | ORAL | Status: DC
  Administered 2017-09-06: 50 mg via ORAL
  Filled 2017-09-06: qty 1

## 2017-09-06 MED ORDER — MIDAZOLAM HCL 5 MG/5ML IJ SOLN
INTRAMUSCULAR | Status: DC | PRN
  Administered 2017-09-06: 2 mg via INTRAVENOUS

## 2017-09-06 MED ORDER — PROPOFOL 10 MG/ML IV BOLUS
INTRAVENOUS | Status: AC
Start: 2017-09-06 — End: ?
  Filled 2017-09-06: qty 20

## 2017-09-06 MED ORDER — LACTATED RINGERS IV SOLN
INTRAVENOUS | Status: DC
  Administered 2017-09-06: 09:00:00 via INTRAVENOUS

## 2017-09-06 MED ORDER — HYDROCODONE-ACETAMINOPHEN 7.5-325 MG PO TABS: 1.0000 | ORAL_TABLET | Freq: Once | ORAL | Status: DC | PRN

## 2017-09-06 MED ORDER — MIDAZOLAM HCL 2 MG/2ML IJ SOLN
INTRAMUSCULAR | Status: AC
  Filled 2017-09-06: qty 2

## 2017-09-06 MED ORDER — CHLORHEXIDINE GLUCONATE 4 % EX LIQD: 60.0000 mL | Freq: Once | CUTANEOUS | Status: DC

## 2017-09-06 SURGICAL SUPPLY — 45 items
BANDAGE ELASTIC 6 LF NS (GAUZE/BANDAGES/DRESSINGS) ×2 IMPLANT
BLADE AGGRESSIVE PLUS 4.0 (BLADE) ×2 IMPLANT
BLADE SURG SZ11 CARB STEEL (BLADE) ×2 IMPLANT
CHLORAPREP W/TINT 26ML (MISCELLANEOUS) ×2 IMPLANT
CLOTH BEACON ORANGE TIMEOUT ST (SAFETY) ×2 IMPLANT
COOLER CRYO IC GRAV AND TUBE (ORTHOPEDIC SUPPLIES) ×2 IMPLANT
CUFF CRYO KNEE18X23 MED (MISCELLANEOUS) ×2 IMPLANT
CUFF TOURNIQUET SINGLE 34IN LL (TOURNIQUET CUFF) ×2 IMPLANT
CUTTER ANGLED DBL BITE 4.5 (BURR) IMPLANT
DECANTER SPIKE VIAL GLASS SM (MISCELLANEOUS) ×4 IMPLANT
GAUZE 4X4 16PLY RFD (DISPOSABLE) ×2 IMPLANT
GAUZE SPONGE 4X4 12PLY STRL (GAUZE/BANDAGES/DRESSINGS) ×2 IMPLANT
GAUZE XEROFORM 5X9 LF (GAUZE/BANDAGES/DRESSINGS) ×2 IMPLANT
GLOVE BIOGEL PI IND STRL 7.0 (GLOVE) ×2 IMPLANT
GLOVE BIOGEL PI INDICATOR 7.0 (GLOVE) ×2
GLOVE ECLIPSE 6.5 STRL STRAW (GLOVE) ×2 IMPLANT
GLOVE SKINSENSE NS SZ8.0 LF (GLOVE) ×1
GLOVE SKINSENSE STRL SZ8.0 LF (GLOVE) ×1 IMPLANT
GLOVE SS N UNI LF 8.5 STRL (GLOVE) ×2 IMPLANT
GOWN STRL REUS W/ TWL LRG LVL3 (GOWN DISPOSABLE) ×1 IMPLANT
GOWN STRL REUS W/TWL LRG LVL3 (GOWN DISPOSABLE) ×1
GOWN STRL REUS W/TWL XL LVL3 (GOWN DISPOSABLE) ×2 IMPLANT
HLDR LEG FOAM (MISCELLANEOUS) ×1 IMPLANT
IV NS IRRIG 3000ML ARTHROMATIC (IV SOLUTION) ×6 IMPLANT
KIT BLADEGUARD II DBL (SET/KITS/TRAYS/PACK) ×2 IMPLANT
KIT TURNOVER CYSTO (KITS) ×2 IMPLANT
LEG HOLDER FOAM (MISCELLANEOUS) ×1
MANIFOLD NEPTUNE II (INSTRUMENTS) ×2 IMPLANT
MARKER SKIN DUAL TIP RULER LAB (MISCELLANEOUS) ×2 IMPLANT
NEEDLE HYPO 18GX1.5 BLUNT FILL (NEEDLE) ×2 IMPLANT
NEEDLE HYPO 21X1.5 SAFETY (NEEDLE) ×2 IMPLANT
NEEDLE SPNL 18GX3.5 QUINCKE PK (NEEDLE) ×2 IMPLANT
NS IRRIG 1000ML POUR BTL (IV SOLUTION) ×2 IMPLANT
PACK ARTHRO LIMB DRAPE STRL (MISCELLANEOUS) ×2 IMPLANT
PAD ABD 5X9 TENDERSORB (GAUZE/BANDAGES/DRESSINGS) ×2 IMPLANT
PAD ARMBOARD 7.5X6 YLW CONV (MISCELLANEOUS) ×2 IMPLANT
PADDING CAST COTTON 6X4 STRL (CAST SUPPLIES) ×2 IMPLANT
PROBE BIPOLAR 50 DEGREE SUCT (MISCELLANEOUS) ×2 IMPLANT
SET ARTHROSCOPY INST (INSTRUMENTS) ×2 IMPLANT
SET BASIN LINEN APH (SET/KITS/TRAYS/PACK) ×2 IMPLANT
SUT ETHILON 3 0 FSL (SUTURE) ×2 IMPLANT
SYR 10ML LL (SYRINGE) ×2 IMPLANT
SYR 30ML LL (SYRINGE) ×2 IMPLANT
TUBE CONNECTING 12X1/4 (SUCTIONS) ×4 IMPLANT
TUBING ARTHRO INFLOW-ONLY STRL (TUBING) ×2 IMPLANT

## 2017-09-06 NOTE — Op Note (Signed)
09/06/2017  11:09 AM  PATIENT:  Anthony [name]  62 y.o. male  PRE-OPERATIVE DIAGNOSIS:  torn medial meniscus left knee  POST-OPERATIVE DIAGNOSIS:  torn medial meniscus left knee and synovitis  FINDINGS: Synovitis of the joint mild, small tear posterior horn medial meniscus at the root, small chondral defect nonweightbearing surface lateral aspect medial femoral condyle remaining joint surfaces were intact there was some fibrinous debris throughout the joint  PROCEDURE:  Procedure(s): LEFT KNEE ARTHROSCOPY WITH MEDIAL MENISECTOMY AND SYNOVECTOMY (Left)  The operative findings are   Medial posterior horn medial meniscal tear at the root, lateral portion nonweightbearing surface medial femoral condyle chondral lesion 5 x 5 mm with fibrinous covering Lateral normal Patellofemoral normal Notch normal  Mild synovitis suprapatellar pouch medial gutter lateral gutter notch anterior compartment   SURGEON:  Surgeon(s) and Role:    Carole Civil, MD - Primary  Knee arthroscopy dictation  The patient was identified in the preoperative holding area using 2 approved identification mechanisms. The chart was reviewed and updated. The surgical site was confirmed as left knee and marked with an indelible marker.  The patient was taken to the operating room for anesthesia. After successful general anesthesia, Ancef 2 g was used as IV antibiotics.  The patient was placed in the supine position with the (left) the operative extremity in an arthroscopic leg holder and the opposite extremity in a padded leg holder.  The timeout was executed.  A lateral portal was established with an 11 blade and the scope was introduced into the joint. A diagnostic arthroscopy was performed in circumferential manner examining the entire knee joint. A medial portal was established and the diagnostic arthroscopy was repeated using a probe to palpate intra-articular structures as they were encountered.    The  medial meniscus was resected using a duckbill forceps. The meniscal fragments were removed with a motorized shaver. The meniscus was balanced with a combination of a motorized shaver and a 50 Linvatec wand until a stable rim was obtained.  Synovectomy was performed and electrocautery device was used to coagulate any injected synovium  The arthroscopic pump was placed on the wash mode and any excess debris was removed from the joint using suction.  60 cc of Marcaine with epinephrine was injected through the arthroscope.  The portals were closed with 3-0 nylon suture.  A sterile bandage, Ace wrap and Cryo/Cuff was placed and the Cryo/Cuff was activated. The patient was taken to the recovery room in stable condition.   PHYSICIAN ASSISTANT:   ASSISTANTS: none   ANESTHESIA:   general  EBL:  0 mL   BLOOD ADMINISTERED:none  DRAINS: none   LOCAL MEDICATIONS USED:  MARCAINE     SPECIMEN:  No Specimen  DISPOSITION OF SPECIMEN:  N/A  COUNTS:  YES  TOURNIQUET:  * Missing tourniquet times found for documented tourniquets in log: [id number] *  DICTATION: .Dragon Dictation  PLAN OF CARE: Discharge to home after PACU  PATIENT DISPOSITION:  PACU - hemodynamically stable.   Delay start of Pharmacological VTE agent (>24hrs) due to surgical blood loss or risk of bleeding: not applicable

## 2017-09-06 NOTE — Telephone Encounter (Signed)
Josh, the pharmacist from [place] on [address] Hulen Skains and left a message at lunch.  He stated he has a question regarding the prescription Dr. Aline Brochure wrote for this patient today.  Would you call him to see if you can help?  [facility] phone number is [phone]  Thanks

## 2017-09-06 NOTE — Discharge Instructions (Signed)
You had a small meniscal tear on the medial side as noted on MRI.  We remove the torn portion of meniscus approximately 15% You had a lot of debris from gout which was removed The joint lining or synovial covering/tissue that was inflamed was also removed   We need to treat you for gout you will not need a total knee replacement

## 2017-09-06 NOTE — Anesthesia Procedure Notes (Signed)
Procedure Name: LMA Insertion Date/Time: 09/06/2017 10:10 AM Performed by: Ollen Bowl, CRNA Pre-anesthesia Checklist: Patient identified, Patient being monitored, Emergency Drugs available, Timeout performed and Suction available Patient Re-evaluated:Patient Re-evaluated prior to induction Oxygen Delivery Method: Circle System Utilized Preoxygenation: Pre-oxygenation with 100% oxygen Induction Type: IV induction Ventilation: Mask ventilation without difficulty LMA: LMA inserted LMA Size: 4.0 Number of attempts: 1 Placement Confirmation: positive ETCO2 and breath sounds checked- equal and bilateral

## 2017-09-06 NOTE — Anesthesia Preprocedure Evaluation (Signed)
Anesthesia Evaluation  Patient identified by MRN, date of birth, ID band Patient awake    Reviewed: Allergy & Precautions, H&P , NPO status , Patient's Chart, lab work & pertinent test results, reviewed documented beta blocker date and time   Airway Mallampati: II  TM Distance: >3 FB Neck ROM: full    Dental no notable dental hx. (+) Teeth Intact, Dental Advidsory Given   Pulmonary neg pulmonary ROS, sleep apnea ,    Pulmonary exam normal breath sounds clear to auscultation       Cardiovascular Exercise Tolerance: Good hypertension, On Medications negative cardio ROS   Rhythm:regular Rate:Normal     Neuro/Psych  Headaches, PSYCHIATRIC DISORDERS Anxiety Depression negative neurological ROS  negative psych ROS   GI/Hepatic negative GI ROS, Neg liver ROS, GERD  ,  Endo/Other  negative endocrine ROS  Renal/GU negative Renal ROS  negative genitourinary   Musculoskeletal   Abdominal   Peds  Hematology negative hematology ROS (+)   Anesthesia Other Findings HTN, otherwise healthy  Denies other sig PMH  Reproductive/Obstetrics negative OB ROS                             Anesthesia Physical Anesthesia Plan  ASA: II  Anesthesia Plan: General   Post-op Pain Management:    Induction:   PONV Risk Score and Plan:   Airway Management Planned:   Additional Equipment:   Intra-op Plan:   Post-operative Plan:   Informed Consent: I have reviewed the patients History and Physical, chart, labs and discussed the procedure including the risks, benefits and alternatives for the proposed anesthesia with the patient or authorized representative who has indicated his/her understanding and acceptance.   Dental Advisory Given  Plan Discussed with: CRNA and Anesthesiologist  Anesthesia Plan Comments:         Anesthesia Quick Evaluation

## 2017-09-06 NOTE — Anesthesia Postprocedure Evaluation (Signed)
Anesthesia Post Note  Patient: Anthony [name]  Procedure(s) Performed: LEFT KNEE ARTHROSCOPY WITH MEDIAL MENISECTOMY AND SYNOVECTOMY (Left )  Patient location during evaluation: PACU Anesthesia Type: General Level of consciousness: awake and [name] and oriented Pain management: pain level controlled Vital Signs Assessment: post-procedure vital signs reviewed and stable Respiratory status: spontaneous breathing Cardiovascular status: blood pressure returned to baseline and stable Postop Assessment: no apparent nausea or vomiting Anesthetic complications: no Comments: Late entry     Last Vitals:  Vitals:   09/06/17 1134 09/06/17 1145  BP:  116/79  Pulse: 80 74  Resp: 14 12  Temp:    SpO2: 94% 94%    Last Pain:  Vitals:   09/06/17 1145  TempSrc:   PainSc: 4                  [name]

## 2017-09-06 NOTE — Transfer of Care (Signed)
Immediate Anesthesia Transfer of Care Note  Patient: Anthony [name]  Procedure(s) Performed: LEFT KNEE ARTHROSCOPY WITH MEDIAL MENISECTOMY AND SYNOVECTOMY (Left )  Patient Location: PACU  Anesthesia Type:General  Level of Consciousness: awake and [name]   Airway & Oxygen Therapy: Patient Spontanous Breathing  Post-op Assessment: Report given to RN  Post vital signs: Reviewed and stable  Last Vitals:  Vitals Value Taken Time  BP 119/69 09/06/2017 11:15 AM  Temp    Pulse 81 09/06/2017 11:18 AM  Resp 6 09/06/2017 11:18 AM  SpO2 100 % 09/06/2017 11:18 AM  Vitals shown include unvalidated device data.  Last Pain:  Vitals:   09/06/17 0858  TempSrc: Oral  PainSc: 7       Patients Stated Pain Goal: 7 (16/38/46 6599)  Complications: No apparent anesthesia complications

## 2017-09-06 NOTE — Brief Op Note (Addendum)
09/06/2017  11:09 AM  PATIENT:  Anthony [name]  62 y.o. male  PRE-OPERATIVE DIAGNOSIS:  torn medial meniscus left knee  POST-OPERATIVE DIAGNOSIS:  torn medial meniscus left knee and synovitis  FINDINGS: Synovitis of the joint mild, small tear posterior horn medial meniscus at the root, small chondral defect nonweightbearing surface lateral aspect medial femoral condyle remaining joint surfaces were intact there was some fibrinous debris throughout the joint  PROCEDURE:  Procedure(s): LEFT KNEE ARTHROSCOPY WITH MEDIAL MENISECTOMY AND SYNOVECTOMY (Left)   SURGEON:  Surgeon(s) and Role:    Carole Civil, MD - Primary  PHYSICIAN ASSISTANT:   ASSISTANTS: none   ANESTHESIA:   general  EBL:  0 mL   BLOOD ADMINISTERED:none  DRAINS: none   LOCAL MEDICATIONS USED:  MARCAINE     SPECIMEN:  No Specimen  DISPOSITION OF SPECIMEN:  N/A  COUNTS:  YES  TOURNIQUET:  * Missing tourniquet times found for documented tourniquets in log: [id number] *  DICTATION: .Dragon Dictation  PLAN OF CARE: Discharge to home after PACU  PATIENT DISPOSITION:  PACU - hemodynamically stable.   Delay start of Pharmacological VTE agent (>24hrs) due to surgical blood loss or risk of bleeding: not applicable

## 2017-09-06 NOTE — Telephone Encounter (Signed)
Colchicine has serious contraindication with verapamil / please advise if another medication can be used.   Pharmacy states taking these medications together may cause colchicine toxicity

## 2017-09-06 NOTE — Interval H&P Note (Signed)
History and Physical Interval Note:  09/06/2017 9:54 AM  Anthony [name]  has presented today for surgery, with the diagnosis of elevated WBC in joint fluid left knee torn medial meniscus  The various methods of treatment have been discussed with the patient and family. After consideration of risks, benefits and other options for treatment, the patient has consented to  Procedure(s): LEFT KNEE ARTHROSCOPY WITH MEDIAL MENISECTOMY (Left) as a surgical intervention .  The patient's history has been reviewed, patient examined, no change in status, stable for surgery.  I have reviewed the patient's chart and labs.  Questions were answered to the patient's satisfaction.     Arther Abbott

## 2017-09-07 ENCOUNTER — Other Ambulatory Visit: Payer: Self-pay | Admitting: Orthopedic Surgery

## 2017-09-07 ENCOUNTER — Encounter (HOSPITAL_COMMUNITY): Payer: Self-pay | Admitting: Orthopedic Surgery

## 2017-09-07 DIAGNOSIS — M10062 Idiopathic gout, left knee: Secondary | ICD-10-CM

## 2017-09-07 MED ORDER — ALLOPURINOL 300 MG PO TABS: 300.0000 mg | ORAL_TABLET | Freq: Every day | ORAL | 5 refills | Status: DC

## 2017-09-07 MED ORDER — FEBUXOSTAT 40 MG PO TABS: 40.0000 mg | ORAL_TABLET | Freq: Every day | ORAL | 2 refills | Status: DC

## 2017-09-07 NOTE — Telephone Encounter (Signed)
I spoke to patient, he will pick up the samples, do you want him to d/c the Allopurinol?   He also has asked if you can send in a prescription

## 2017-09-07 NOTE — Telephone Encounter (Signed)
Uloric samples? 40 or 80?

## 2017-09-07 NOTE — Telephone Encounter (Signed)
40

## 2017-09-07 NOTE — Telephone Encounter (Signed)
He can get samples of colcrys from here which he just took last week

## 2017-09-07 NOTE — Telephone Encounter (Signed)
Anthony [name] called back and states that Allopurinol was sent in for him today.  He states he wanted Uloric sent in to [facility].

## 2017-09-08 ENCOUNTER — Telehealth: Payer: Self-pay | Admitting: Orthopedic Surgery

## 2017-09-08 NOTE — Telephone Encounter (Signed)
I told wife to continue to use ice and bring him in tomorrow at 930

## 2017-09-08 NOTE — Telephone Encounter (Signed)
Patient's wife called stating his knee is stiff and his foot is swollen. I told her Dr. Aline Brochure was in surgery today, but I would put a note in regarding this call.  Please call and advise

## 2017-09-09 ENCOUNTER — Ambulatory Visit (INDEPENDENT_AMBULATORY_CARE_PROVIDER_SITE_OTHER): Payer: 59 | Admitting: Orthopedic Surgery

## 2017-09-09 ENCOUNTER — Encounter: Payer: Self-pay | Admitting: Orthopedic Surgery

## 2017-09-09 VITALS — BP 102/65 | HR 89 | Temp 97.6°F | Ht 71.0 in | Wt 199.0 lb

## 2017-09-09 DIAGNOSIS — Z9889 Other specified postprocedural states: Secondary | ICD-10-CM

## 2017-09-09 MED ORDER — HYDROCODONE-ACETAMINOPHEN 7.5-325 MG PO TABS: 1.0000 | ORAL_TABLET | ORAL | 0 refills | Status: DC | PRN

## 2017-09-09 NOTE — Progress Notes (Signed)
Chief Complaint  Patient presents with  . Knee Pain    Post Op 09/06/17 Left Knee   PRE-OPERATIVE DIAGNOSIS:  torn medial meniscus left knee  POST-OPERATIVE DIAGNOSIS:  torn medial meniscus left knee and synovitis  FINDINGS: Synovitis of the joint mild, small tear posterior horn medial meniscus at the root, small chondral defect nonweightbearing surface lateral aspect medial femoral condyle remaining joint surfaces were intact there was some fibrinous debris throughout the joint  PROCEDURE:  Procedure(s): LEFT KNEE ARTHROSCOPY WITH MEDIAL MENISECTOMY AND SYNOVECTOMY (Left)  The operative findings are   Medial: posterior horn medial meniscal tear at the root, lateral portion nonweightbearing surface medial femoral condyle chondral lesion 5 x 5 mm with fibrinous covering Lateral: normal Patellofemoral: normal Notch: normal  Mild synovitis suprapatellar pouch medial gutter lateral gutter notch anterior compartment   SURGEON:  Surgeon(s) and Role:    Carole Civil, MD - Primary   He came in today concerned about pain and swelling in his knee  His knee looks fine mild effusion he bent the knee 80 degrees he holds it in 5 degrees of flexion is having trouble weightbearing and lifting his leg  His situation is complicated by gout he is on Uloric and allopurinol to control gout symptoms  Took his stay sutures out today and I will see him on Wednesday as scheduled

## 2017-09-09 NOTE — Patient Instructions (Signed)
Continue ice, knee bends, medications as ordered increase hydrocodone please get new prescription

## 2017-09-13 DIAGNOSIS — Z9889 Other specified postprocedural states: Secondary | ICD-10-CM | POA: Insufficient documentation

## 2017-09-14 ENCOUNTER — Ambulatory Visit (INDEPENDENT_AMBULATORY_CARE_PROVIDER_SITE_OTHER): Payer: 59 | Admitting: Orthopedic Surgery

## 2017-09-14 ENCOUNTER — Encounter: Payer: Self-pay | Admitting: Orthopedic Surgery

## 2017-09-14 VITALS — BP 123/77 | HR 86 | Ht 71.0 in | Wt 199.0 lb

## 2017-09-14 DIAGNOSIS — M10062 Idiopathic gout, left knee: Secondary | ICD-10-CM

## 2017-09-14 DIAGNOSIS — Z9889 Other specified postprocedural states: Secondary | ICD-10-CM

## 2017-09-14 MED ORDER — HYDROCODONE-ACETAMINOPHEN 7.5-325 MG PO TABS: 1.0000 | ORAL_TABLET | ORAL | 0 refills | Status: AC | PRN

## 2017-09-14 NOTE — Progress Notes (Signed)
Chief Complaint  Patient presents with  . Routine Post Op    09/06/17 knee arthroscopy left    PRE-OPERATIVE DIAGNOSIS:  torn medial meniscus left knee  POST-OPERATIVE DIAGNOSIS:  torn medial meniscus left knee and synovitis  FINDINGS: Synovitis of the joint mild, small tear posterior horn medial meniscus at the root, small chondral defect nonweightbearing surface lateral aspect medial femoral condyle remaining joint surfaces were intact there was some fibrinous debris throughout the joint  PROCEDURE:  Procedure(s): LEFT KNEE ARTHROSCOPY WITH MEDIAL MENISECTOMY AND SYNOVECTOMY (Left)  The operative findings are   Medial posterior horn medial meniscal tear at the root, lateral portion nonweightbearing surface medial femoral condyle chondral lesion 5 x 5 mm with fibrinous covering Lateral normal Patellofemoral normal Notch normal  Mild synovitis suprapatellar pouch medial gutter lateral gutter notch anterior compartment   SURGEON:  Surgeon(s) and Role:    Carole Civil, MD - Primary   He reports she is much better today his swelling is gone down his gout seems to be under control his knee flexion is up to 100 degrees  Recommend daily exercises to include heel slides and quadricep strengthening  Return 2 weeks  Meds ordered this encounter  Medications  . HYDROcodone-acetaminophen (NORCO) 7.5-325 MG tablet    Sig: Take 1 tablet by mouth every 4 (four) hours as needed for up to 5 days for moderate pain.    Dispense:  30 tablet    Refill:  0

## 2017-09-20 ENCOUNTER — Other Ambulatory Visit: Payer: Self-pay | Admitting: Family Medicine

## 2017-09-21 ENCOUNTER — Ambulatory Visit: Payer: 59 | Admitting: Orthopedic Surgery

## 2017-09-22 ENCOUNTER — Telehealth: Payer: Self-pay | Admitting: Orthopedic Surgery

## 2017-09-22 MED ORDER — HYDROCODONE-ACETAMINOPHEN 5-325 MG PO TABS: ORAL_TABLET | ORAL | 0 refills | Status: DC

## 2017-09-22 NOTE — Telephone Encounter (Signed)
Hydrocodone-Acetaminophen 7.5/325 mg  Qty 30 Tablets  PATIENT USES [facility] ON [address]

## 2017-09-26 ENCOUNTER — Ambulatory Visit: Payer: 59 | Admitting: Orthopedic Surgery

## 2017-09-28 ENCOUNTER — Ambulatory Visit (INDEPENDENT_AMBULATORY_CARE_PROVIDER_SITE_OTHER): Payer: 59 | Admitting: Orthopedic Surgery

## 2017-09-28 ENCOUNTER — Encounter: Payer: Self-pay | Admitting: Orthopedic Surgery

## 2017-09-28 VITALS — BP 111/73 | HR 83 | Ht 71.0 in | Wt 199.0 lb

## 2017-09-28 DIAGNOSIS — Z9889 Other specified postprocedural states: Secondary | ICD-10-CM

## 2017-09-28 MED ORDER — CYCLOBENZAPRINE HCL 5 MG PO TABS: 5.0000 mg | ORAL_TABLET | Freq: Three times a day (TID) | ORAL | 0 refills | Status: DC | PRN

## 2017-09-28 NOTE — Progress Notes (Signed)
Chief Complaint  Patient presents with  . Follow-up    post op knee scope 09/06/17   PRE-OPERATIVE DIAGNOSIS:  torn medial meniscus left knee  POST-OPERATIVE DIAGNOSIS:  torn medial meniscus left knee and synovitis  FINDINGS: Synovitis of the joint mild, small tear posterior horn medial meniscus at the root, small chondral defect nonweightbearing surface lateral aspect medial femoral condyle remaining joint surfaces were intact there was some fibrinous debris throughout the joint  PROCEDURE:  Procedure(s): LEFT KNEE ARTHROSCOPY WITH MEDIAL MENISECTOMY AND SYNOVECTOMY (Left)  The operative findings are   Medial posterior horn medial meniscal tear at the root, lateral portion nonweightbearing surface medial femoral condyle chondral lesion 5 x 5 mm with fibrinous covering Lateral normal Patellofemoral normal Notch normal  Mild synovitis suprapatellar pouch medial gutter lateral gutter notch anterior compartment   SURGEON:  Surgeon(s) and Role:    Carole Civil, MD - Primary   Patient with history of gout 62 years old had a knee scope had some synovitis still complains of a lot of pain is on his allopurinol he took his colchicine when he was in the postop.  He complains that he cannot flex his knee all the way and is having muscle spasms at night  He has a small effusion he flexes knee to 105 degrees extends it fully he walks with a cane with a slow gait  Does not appear to have any signs of infection or gout at this time  Recommend physical therapy to improve range of motion Continue ice Add Flexeril for muscle spasms   Return in 3 weeks  Encounter Diagnosis  Name Primary?  . S/P left knee arthroscopy 09/06/17 Yes

## 2017-09-29 ENCOUNTER — Other Ambulatory Visit: Payer: Self-pay | Admitting: Orthopedic Surgery

## 2017-09-29 NOTE — Telephone Encounter (Signed)
Patient requests refill on Hydrocodone/Acetaminophen 5-325  Mgs.  Qty  28  Sig: One tablet by mouth every six hours as needed for pain. Seven day limit.  Patient uses [facility] on [address]

## 2017-09-30 MED ORDER — HYDROCODONE-ACETAMINOPHEN 5-325 MG PO TABS: ORAL_TABLET | ORAL | 0 refills | Status: DC

## 2017-10-03 ENCOUNTER — Encounter: Payer: Self-pay | Admitting: Orthopedic Surgery

## 2017-10-04 ENCOUNTER — Other Ambulatory Visit: Payer: Self-pay

## 2017-10-04 ENCOUNTER — Ambulatory Visit (HOSPITAL_COMMUNITY): Payer: 59 | Attending: Orthopedic Surgery | Admitting: Physical Therapy

## 2017-10-04 DIAGNOSIS — M25562 Pain in left knee: Secondary | ICD-10-CM | POA: Insufficient documentation

## 2017-10-04 DIAGNOSIS — R262 Difficulty in walking, not elsewhere classified: Secondary | ICD-10-CM | POA: Diagnosis not present

## 2017-10-04 DIAGNOSIS — M25662 Stiffness of left knee, not elsewhere classified: Secondary | ICD-10-CM | POA: Insufficient documentation

## 2017-10-04 NOTE — Patient Instructions (Addendum)
Functional Quadriceps: Sit to Stand    Sit on edge of chair, feet flat on floor. Stand upright, extending knees fully. Repeat _10___ times per set. Do __1__ sets per session. Do ___2_ sessions per day.  http://orth.exer.us/734   Copyright  VHI. All rights reserved.  Heel Raise: Bilateral (Standing)    Rise on balls of feet. Repeat 10____ times per set. Do __1__ sets per session. Do __2__ sessions per day.  http://orth.exer.us/38   Copyright  VHI. All rights reserved.  Balance: Unilateral   At the kitchen counter. Attempt to balance on left leg, eyes open. Hold __30-45__ seconds. Repeat ___3_ times per set. Do __1__ sets per session. Do __2__ sessions per day. Perform exercise with eyes closed.  http://orth.exer.us/28   Copyright  VHI. All rights reserved.  Knee Extension (Sitting)    Place __0-4__ pound weight on left ankle and straighten knee fully, lower slowly. Repeat __10__ times per set. Do __1__ sets per session. Do _3___ sessions per day.  http://orth.exer.us/732   Copyright  VHI. All rights reserved.  Strengthening: Quadriceps Set    Tighten muscles on top of thighs by pushing knees down into surface. Hold _5___ seconds. Repeat _10___ times per set. Do ___1_ sets per session. Do ___2_ sessions per day.  http://orth.exer.us/602   Copyright  VHI. All rights reserved.  Knee Extension Mobilization: Towel Prop    With rolled towel under right ankle, place __0__ pound weight across knee. Hold _5-30___ minutes. Repeat ___1_ times per set. Do __1__ sets per session. Do ____2 sessions per day.  http://orth.exer.us/720   Copyright  VHI. All rights reserved.  Self-Mobilization: Heel Slide (Supine)    Slide left heel toward buttocks until a gentle stretch is felt. Hold _5___ seconds. Relax. Repeat __10__ times per set. Do ___1_ sets per session. Do _2___ sessions per day.  http://orth.exer.us/710   Copyright  VHI. All rights reserved.
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
## 2017-10-06 ENCOUNTER — Ambulatory Visit (HOSPITAL_COMMUNITY): Payer: 59 | Admitting: Physical Therapy

## 2017-10-06 DIAGNOSIS — M25562 Pain in left knee: Secondary | ICD-10-CM | POA: Diagnosis not present

## 2017-10-06 DIAGNOSIS — M25662 Stiffness of left knee, not elsewhere classified: Secondary | ICD-10-CM

## 2017-10-06 DIAGNOSIS — R262 Difficulty in walking, not elsewhere classified: Secondary | ICD-10-CM
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
## 2017-10-11 ENCOUNTER — Encounter: Payer: Self-pay | Admitting: Family Medicine

## 2017-10-11 ENCOUNTER — Ambulatory Visit: Payer: 59 | Admitting: Family Medicine

## 2017-10-11 ENCOUNTER — Ambulatory Visit (HOSPITAL_COMMUNITY): Payer: 59 | Admitting: Physical Therapy

## 2017-10-11 VITALS — BP 122/74 | Ht 71.0 in | Wt 196.0 lb

## 2017-10-11 DIAGNOSIS — I10 Essential (primary) hypertension: Secondary | ICD-10-CM | POA: Diagnosis not present

## 2017-10-11 DIAGNOSIS — E785 Hyperlipidemia, unspecified: Secondary | ICD-10-CM | POA: Diagnosis not present

## 2017-10-11 DIAGNOSIS — R262 Difficulty in walking, not elsewhere classified: Secondary | ICD-10-CM

## 2017-10-11 DIAGNOSIS — F5101 Primary insomnia: Secondary | ICD-10-CM | POA: Diagnosis not present

## 2017-10-11 DIAGNOSIS — M10062 Idiopathic gout, left knee: Secondary | ICD-10-CM | POA: Diagnosis not present

## 2017-10-11 DIAGNOSIS — M25562 Pain in left knee: Secondary | ICD-10-CM

## 2017-10-11 DIAGNOSIS — Z79899 Other long term (current) drug therapy: Secondary | ICD-10-CM | POA: Diagnosis not present

## 2017-10-11 DIAGNOSIS — M25662 Stiffness of left knee, not elsewhere classified: Secondary | ICD-10-CM

## 2017-10-11 MED ORDER — ESCITALOPRAM OXALATE 10 MG PO TABS: 10.0000 mg | ORAL_TABLET | ORAL | 5 refills | Status: DC

## 2017-10-11 MED ORDER — HYDROCHLOROTHIAZIDE 25 MG PO TABS: 25.0000 mg | ORAL_TABLET | Freq: Every day | ORAL | 1 refills | Status: DC

## 2017-10-11 MED ORDER — EZETIMIBE 10 MG PO TABS: 10.0000 mg | ORAL_TABLET | Freq: Every day | ORAL | 1 refills | Status: DC

## 2017-10-11 MED ORDER — PANTOPRAZOLE SODIUM 40 MG PO TBEC: 40.0000 mg | DELAYED_RELEASE_TABLET | Freq: Every day | ORAL | 1 refills | Status: DC

## 2017-10-11 MED ORDER — VERAPAMIL HCL ER 240 MG PO CP24: ORAL_CAPSULE | ORAL | 1 refills | Status: DC

## 2017-10-11 MED ORDER — ALLOPURINOL 100 MG PO TABS: 200.0000 mg | ORAL_TABLET | Freq: Every day | ORAL | 1 refills | Status: DC

## 2017-10-11 MED ORDER — ZOLPIDEM TARTRATE 10 MG PO TABS: ORAL_TABLET | ORAL | 5 refills | Status: DC
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
## 2017-10-12 LAB — LIPID PANEL
CHOLESTEROL TOTAL: 185 mg/dL (ref 100–199)
Chol/HDL Ratio: 5.6 ratio — ABNORMAL HIGH (ref 0.0–5.0)
HDL: 33 mg/dL — ABNORMAL LOW (ref >39)
LDL Calculated: 118 mg/dL — ABNORMAL HIGH (ref 0–99)
Triglycerides: 170 mg/dL — ABNORMAL HIGH (ref 0–149)
VLDL CHOLESTEROL CAL: 34 mg/dL (ref 5–40)

## 2017-10-12 LAB — BASIC METABOLIC PANEL
BUN/Creatinine Ratio: 10 (ref 10–24)
BUN: 12 mg/dL (ref 8–27)
CHLORIDE: 100 mmol/L (ref 96–106)
CO2: 25 mmol/L (ref 20–29)
Calcium: 9.8 mg/dL (ref 8.6–10.2)
Creatinine, Ser: 1.22 mg/dL (ref 0.76–1.27)
GFR calc Af Amer: 73 mL/min/{1.73_m2} (ref >59)
GFR, EST NON AFRICAN AMERICAN: 63 mL/min/{1.73_m2} (ref >59)
GLUCOSE: 91 mg/dL (ref 65–99)
POTASSIUM: 4 mmol/L (ref 3.5–5.2)
SODIUM: 143 mmol/L (ref 134–144)

## 2017-10-12 LAB — HEPATIC FUNCTION PANEL
ALK PHOS: 73 IU/L (ref 39–117)
ALT: 20 IU/L (ref 0–44)
AST: 15 IU/L (ref 0–40)
Albumin: 4.7 g/dL (ref 3.6–4.8)
Bilirubin Total: 0.4 mg/dL (ref 0.0–1.2)
Bilirubin, Direct: 0.13 mg/dL (ref 0.00–0.40)
Total Protein: 7 g/dL (ref 6.0–8.5)

## 2017-10-12 LAB — URIC ACID: Uric Acid: 6.3 mg/dL (ref 3.7–8.6)

## 2017-10-13 ENCOUNTER — Ambulatory Visit (HOSPITAL_COMMUNITY): Payer: 59 | Admitting: Physical Therapy

## 2017-10-13 DIAGNOSIS — M25662 Stiffness of left knee, not elsewhere classified: Secondary | ICD-10-CM

## 2017-10-13 DIAGNOSIS — R262 Difficulty in walking, not elsewhere classified: Secondary | ICD-10-CM

## 2017-10-13 DIAGNOSIS — M25562 Pain in left knee: Secondary | ICD-10-CM
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
## 2017-10-16 ENCOUNTER — Encounter: Payer: Self-pay | Admitting: Family Medicine

## 2017-10-18 ENCOUNTER — Ambulatory Visit (HOSPITAL_COMMUNITY): Payer: 59 | Attending: Orthopedic Surgery

## 2017-10-18 ENCOUNTER — Encounter (HOSPITAL_COMMUNITY): Payer: Self-pay

## 2017-10-18 DIAGNOSIS — R262 Difficulty in walking, not elsewhere classified: Secondary | ICD-10-CM | POA: Diagnosis not present

## 2017-10-18 DIAGNOSIS — M25662 Stiffness of left knee, not elsewhere classified: Secondary | ICD-10-CM | POA: Insufficient documentation

## 2017-10-18 DIAGNOSIS — M25562 Pain in left knee: Secondary | ICD-10-CM | POA: Insufficient documentation
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
## 2017-10-19 ENCOUNTER — Encounter: Payer: Self-pay | Admitting: Orthopedic Surgery

## 2017-10-19 ENCOUNTER — Encounter (HOSPITAL_COMMUNITY): Payer: Self-pay | Admitting: Physical Therapy

## 2017-10-19 ENCOUNTER — Ambulatory Visit (HOSPITAL_COMMUNITY): Payer: 59 | Admitting: Physical Therapy

## 2017-10-19 ENCOUNTER — Ambulatory Visit (INDEPENDENT_AMBULATORY_CARE_PROVIDER_SITE_OTHER): Payer: 59 | Admitting: Orthopedic Surgery

## 2017-10-19 VITALS — BP 133/88 | HR 104 | Ht 71.0 in | Wt 195.0 lb

## 2017-10-19 DIAGNOSIS — Z9889 Other specified postprocedural states: Secondary | ICD-10-CM

## 2017-10-19 DIAGNOSIS — M25562 Pain in left knee: Secondary | ICD-10-CM

## 2017-10-19 DIAGNOSIS — M25662 Stiffness of left knee, not elsewhere classified: Secondary | ICD-10-CM

## 2017-10-19 DIAGNOSIS — R262 Difficulty in walking, not elsewhere classified: Secondary | ICD-10-CM

## 2017-10-19 MED ORDER — HYDROCODONE-ACETAMINOPHEN 5-325 MG PO TABS: 1.0000 | ORAL_TABLET | Freq: Every day | ORAL | 0 refills | Status: AC

## 2017-10-19 NOTE — Patient Instructions (Signed)
OOW 3 WEEKS  

## 2017-10-19 NOTE — Progress Notes (Signed)
Post op  Chief Complaint  Patient presents with  . Post-op Follow-up    left knee arthroscopy 09/06/17    Postoperative day 43 status post left knee arthroscopy for inflammatory arthritis  Patient is allergic to NSAIDs  He is on allopurinol  He is also on Eliquis  His swelling is gone down his range of motion is improved his pain is improved.  He is only taking hydrocodone at night and after physical therapy  Exam shows a quiet knee 3 degree loss of extension flexion is improving he is walking without support  We recommend he come back in 3 weeks he should not work until that time  We refilled his hydrocodone for nighttime use  Follow-up in 3 weeks  Encounter Diagnosis  Name Primary?  . S/P left knee arthroscopy 09/06/17 Yes
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
## 2017-10-21 ENCOUNTER — Ambulatory Visit (HOSPITAL_COMMUNITY): Payer: 59

## 2017-10-24 ENCOUNTER — Ambulatory Visit (HOSPITAL_COMMUNITY): Payer: 59 | Admitting: Physical Therapy

## 2017-10-24 DIAGNOSIS — M25662 Stiffness of left knee, not elsewhere classified: Secondary | ICD-10-CM

## 2017-10-24 DIAGNOSIS — M25562 Pain in left knee: Secondary | ICD-10-CM

## 2017-10-24 DIAGNOSIS — R262 Difficulty in walking, not elsewhere classified: Secondary | ICD-10-CM
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
## 2017-10-26 ENCOUNTER — Ambulatory Visit (HOSPITAL_COMMUNITY): Payer: 59 | Admitting: Physical Therapy

## 2017-10-26 DIAGNOSIS — R262 Difficulty in walking, not elsewhere classified: Secondary | ICD-10-CM

## 2017-10-26 DIAGNOSIS — M25662 Stiffness of left knee, not elsewhere classified: Secondary | ICD-10-CM

## 2017-10-26 DIAGNOSIS — M25562 Pain in left knee: Secondary | ICD-10-CM | POA: Diagnosis not present
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
## 2017-10-28 ENCOUNTER — Ambulatory Visit (HOSPITAL_COMMUNITY): Payer: 59

## 2017-10-28 ENCOUNTER — Encounter (HOSPITAL_COMMUNITY): Payer: Self-pay

## 2017-10-28 DIAGNOSIS — M25662 Stiffness of left knee, not elsewhere classified: Secondary | ICD-10-CM

## 2017-10-28 DIAGNOSIS — M25562 Pain in left knee: Secondary | ICD-10-CM

## 2017-10-28 DIAGNOSIS — R262 Difficulty in walking, not elsewhere classified: Secondary | ICD-10-CM
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
## 2017-10-31 ENCOUNTER — Ambulatory Visit (HOSPITAL_COMMUNITY): Payer: 59 | Admitting: Physical Therapy

## 2017-10-31 DIAGNOSIS — M25562 Pain in left knee: Secondary | ICD-10-CM

## 2017-10-31 DIAGNOSIS — R262 Difficulty in walking, not elsewhere classified: Secondary | ICD-10-CM

## 2017-10-31 DIAGNOSIS — M25662 Stiffness of left knee, not elsewhere classified: Secondary | ICD-10-CM
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
## 2017-11-02 ENCOUNTER — Other Ambulatory Visit: Payer: Self-pay

## 2017-11-02 ENCOUNTER — Encounter (HOSPITAL_COMMUNITY): Payer: Self-pay | Admitting: Physical Therapy

## 2017-11-02 ENCOUNTER — Ambulatory Visit (HOSPITAL_COMMUNITY): Payer: 59 | Admitting: Physical Therapy

## 2017-11-02 DIAGNOSIS — M25562 Pain in left knee: Secondary | ICD-10-CM

## 2017-11-02 DIAGNOSIS — R262 Difficulty in walking, not elsewhere classified: Secondary | ICD-10-CM

## 2017-11-02 DIAGNOSIS — M25662 Stiffness of left knee, not elsewhere classified: Secondary | ICD-10-CM
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
## 2017-11-04 ENCOUNTER — Telehealth (HOSPITAL_COMMUNITY): Payer: Self-pay

## 2017-11-04 ENCOUNTER — Telehealth: Payer: Self-pay | Admitting: Radiology

## 2017-11-04 ENCOUNTER — Ambulatory Visit (HOSPITAL_COMMUNITY): Payer: 59

## 2017-11-04 NOTE — Telephone Encounter (Signed)
Insurance will not cover Uloric, his PCP has given him Allopurinol instead . He can not use the Colchicine because of contraindication with his BP meds. The pharmacy is faxing [place] requests to change Uloric to Allopurinol, but this is taken care of already.

## 2017-11-04 NOTE — Telephone Encounter (Signed)
Pt is sick today and will be here Monday

## 2017-11-07 ENCOUNTER — Ambulatory Visit (HOSPITAL_COMMUNITY): Payer: 59

## 2017-11-07 ENCOUNTER — Encounter (HOSPITAL_COMMUNITY): Payer: Self-pay

## 2017-11-07 ENCOUNTER — Telehealth: Payer: Self-pay | Admitting: Orthopedic Surgery

## 2017-11-07 DIAGNOSIS — R262 Difficulty in walking, not elsewhere classified: Secondary | ICD-10-CM

## 2017-11-07 DIAGNOSIS — M25662 Stiffness of left knee, not elsewhere classified: Secondary | ICD-10-CM

## 2017-11-07 DIAGNOSIS — M25562 Pain in left knee: Secondary | ICD-10-CM
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
## 2017-11-07 NOTE — Telephone Encounter (Signed)
Refill request received in fax from [facility], [address], [place] for  Febuxostat 40 mg tablets  1 Tablet by mouth Daily  - Message "Drug not covered by patient's plan. The preferred alternative is AlloPurinol TabMG  - please call pharmacy [phone] or fax [phone])

## 2017-11-07 NOTE — Telephone Encounter (Signed)
This has already been responded to with a call AND fax, it is duplicate, of a duplicate, of a duplicate, of a duplicate.

## 2017-11-09 ENCOUNTER — Ambulatory Visit (HOSPITAL_COMMUNITY): Payer: 59 | Admitting: Physical Therapy

## 2017-11-09 ENCOUNTER — Other Ambulatory Visit: Payer: Self-pay | Admitting: Family Medicine

## 2017-11-09 ENCOUNTER — Ambulatory Visit (INDEPENDENT_AMBULATORY_CARE_PROVIDER_SITE_OTHER): Payer: 59 | Admitting: Orthopedic Surgery

## 2017-11-09 ENCOUNTER — Encounter: Payer: Self-pay | Admitting: Orthopedic Surgery

## 2017-11-09 VITALS — BP 124/76 | HR 77 | Ht 71.0 in | Wt 200.0 lb

## 2017-11-09 DIAGNOSIS — Z9889 Other specified postprocedural states: Secondary | ICD-10-CM

## 2017-11-09 DIAGNOSIS — M25562 Pain in left knee: Secondary | ICD-10-CM

## 2017-11-09 DIAGNOSIS — R262 Difficulty in walking, not elsewhere classified: Secondary | ICD-10-CM

## 2017-11-09 DIAGNOSIS — M25662 Stiffness of left knee, not elsewhere classified: Secondary | ICD-10-CM

## 2017-11-09 DIAGNOSIS — M1A0620 Idiopathic chronic gout, left knee, without tophus (tophi): Secondary | ICD-10-CM
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
## 2017-11-09 NOTE — Patient Instructions (Signed)
Work note  Continue therapy  Out of work for 1 month  Follow-up 1 month  Take Flexeril Uloric and allopurinol for your knee

## 2017-11-09 NOTE — Telephone Encounter (Signed)
Six mo worth ok 

## 2017-11-09 NOTE — Progress Notes (Signed)
Chief Complaint  Patient presents with  . Knee Pain    Left knee DOS 09/06/17    Global.  Follow-up Anthony [name] is making improvements in his overall knee function is currently on Flexeril Uloric and allopurinol.  He complains of difficulty with stairs weakness in his left knee and trouble with his balance  He is improving with physical therapy he will need more intensive therapy to address his balance issues as well as his weakness  Exam shows quadriceps weakness on extension flexion is only 100 degrees although he has no effusion.  He does have tenderness over the medial lateral joint lines and boggy synovium  Recommend physical therapy x1 month  Follow-up in 1 month  After work 1 month to improve strength and balance issues  Encounter Diagnoses  Name Primary?  . S/P left knee arthroscopy 09/06/17 Yes  . Idiopathic chronic gout of left knee without tophus

## 2017-11-10 ENCOUNTER — Ambulatory Visit (HOSPITAL_COMMUNITY): Payer: 59 | Admitting: Physical Therapy

## 2017-11-10 DIAGNOSIS — M25562 Pain in left knee: Secondary | ICD-10-CM

## 2017-11-10 DIAGNOSIS — M25662 Stiffness of left knee, not elsewhere classified: Secondary | ICD-10-CM

## 2017-11-10 DIAGNOSIS — R262 Difficulty in walking, not elsewhere classified: Secondary | ICD-10-CM
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
## 2017-11-14 ENCOUNTER — Ambulatory Visit (HOSPITAL_COMMUNITY): Payer: 59 | Admitting: Physical Therapy

## 2017-11-14 DIAGNOSIS — M25662 Stiffness of left knee, not elsewhere classified: Secondary | ICD-10-CM

## 2017-11-14 DIAGNOSIS — M25562 Pain in left knee: Secondary | ICD-10-CM

## 2017-11-14 DIAGNOSIS — R262 Difficulty in walking, not elsewhere classified: Secondary | ICD-10-CM
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
## 2017-11-16 ENCOUNTER — Ambulatory Visit (HOSPITAL_COMMUNITY): Payer: 59

## 2017-11-16 ENCOUNTER — Encounter (HOSPITAL_COMMUNITY): Payer: Self-pay

## 2017-11-16 DIAGNOSIS — M25562 Pain in left knee: Secondary | ICD-10-CM | POA: Diagnosis not present

## 2017-11-16 DIAGNOSIS — M25662 Stiffness of left knee, not elsewhere classified: Secondary | ICD-10-CM

## 2017-11-16 DIAGNOSIS — R262 Difficulty in walking, not elsewhere classified: Secondary | ICD-10-CM
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
## 2017-11-28 ENCOUNTER — Telehealth: Payer: Self-pay | Admitting: Orthopedic Surgery

## 2017-11-28 NOTE — Telephone Encounter (Signed)
Patient came by office following being contacted by employer regarding return to work ASAP. Form also given to patient for Dr Aline Brochure to complete; their request is to schedule another appointment in order to release patient to work as follows:  - no deep squatting  - no kneeling on floor with left knee  - no climbing ladders or stairs  - no crawling on floor  Patient/employer awaiting appointment date and when to expect form. Please advise.

## 2017-11-28 NOTE — Telephone Encounter (Signed)
Okay to do it

## 2017-11-28 NOTE — Telephone Encounter (Signed)
Yes, patient states he has to be okay with this, as he will not have a job if not.

## 2017-11-28 NOTE — Telephone Encounter (Signed)
IS HE OK WITH THIS ??

## 2017-11-29 NOTE — Telephone Encounter (Signed)
Spoke with patient and scheduled appointment for this to be done.

## 2017-11-30 ENCOUNTER — Encounter: Payer: Self-pay | Admitting: Orthopedic Surgery

## 2017-11-30 ENCOUNTER — Ambulatory Visit (INDEPENDENT_AMBULATORY_CARE_PROVIDER_SITE_OTHER): Payer: 59 | Admitting: Orthopedic Surgery

## 2017-11-30 VITALS — BP 133/81 | HR 63 | Ht 71.0 in | Wt 201.0 lb

## 2017-11-30 DIAGNOSIS — Z9889 Other specified postprocedural states: Secondary | ICD-10-CM

## 2017-11-30 DIAGNOSIS — M1A0620 Idiopathic chronic gout, left knee, without tophus (tophi): Secondary | ICD-10-CM

## 2017-11-30 NOTE — Progress Notes (Signed)
Chief Complaint  Patient presents with  . Routine Post Op    09/06/17 left knee arthroscopy     22 male had left knee arthroscopy synovectomy postoperative gout symptoms intermittently with intermittent swelling.  His employer and disability insurer want specific guidelines for his return to work  We settled on return to work November 18 with restrictions primarily involving squatting kneeling bending   Review of Systems  Constitutional: Negative for chills and fever.  Musculoskeletal: Positive for joint pain.       Knee swells intermittently occasionally gets warm to touch   Physical Exam  Constitutional: He appears well-developed and well-nourished.  HENT:  Head: Normocephalic and atraumatic.  Musculoskeletal:       Legs: Nursing note and vitals reviewed.  Encounter Diagnoses  Name Primary?  . S/P left knee arthroscopy 09/06/17 Yes  . Idiopathic chronic gout of left knee without tophus     Return in 6 months return to work November 18 with restrictions

## 2017-12-06 ENCOUNTER — Other Ambulatory Visit: Payer: Self-pay | Admitting: Orthopedic Surgery

## 2017-12-06 ENCOUNTER — Ambulatory Visit: Payer: 59 | Admitting: Allergy & Immunology

## 2017-12-07 ENCOUNTER — Ambulatory Visit: Payer: 59 | Admitting: Allergy & Immunology

## 2017-12-09 ENCOUNTER — Ambulatory Visit: Payer: 59 | Admitting: Orthopedic Surgery

## 2018-01-10 ENCOUNTER — Other Ambulatory Visit: Payer: Self-pay | Admitting: Orthopedic Surgery

## 2018-01-20 ENCOUNTER — Encounter (HOSPITAL_COMMUNITY): Payer: Self-pay

## 2018-01-20 NOTE — Therapy (Signed)
[place], [place], [address] Phone: [phone]   Fax:  [phone]  Patient Details  Name: BRYDON SPAHR MRN: [id number] Date of Birth: 1955-04-22 Referring Provider:  No ref. provider found  Encounter Date: 01/20/2018  PHYSICAL THERAPY DISCHARGE SUMMARY  Visits from Start of Care: 16  Current functional level related to goals / functional outcomes: See last note   Remaining deficits: See last note   Education / Equipment: n/a  Plan: Patient agrees to discharge.  Patient goals were partially met. Patient is being discharged due to not returning since the last visit.  ?????     Geraldine Solar PT, [place] [address] [place], [place], [address] Phone: [phone]   Fax:  [phone]

## 2018-02-01 ENCOUNTER — Ambulatory Visit: Payer: 59 | Admitting: Orthopedic Surgery

## 2018-02-01 ENCOUNTER — Encounter: Payer: Self-pay | Admitting: Orthopedic Surgery

## 2018-02-01 VITALS — BP 123/75 | HR 63 | Ht 71.0 in | Wt 200.0 lb

## 2018-02-01 DIAGNOSIS — M1A0620 Idiopathic chronic gout, left knee, without tophus (tophi): Secondary | ICD-10-CM | POA: Diagnosis not present

## 2018-02-01 DIAGNOSIS — Z9889 Other specified postprocedural states: Secondary | ICD-10-CM

## 2018-02-01 MED ORDER — TRAMADOL-ACETAMINOPHEN 37.5-325 MG PO TABS: 1.0000 | ORAL_TABLET | Freq: Two times a day (BID) | ORAL | 0 refills | Status: DC | PRN

## 2018-02-01 NOTE — Patient Instructions (Signed)
Return to work full duty Feb 3

## 2018-02-01 NOTE — Progress Notes (Signed)
Progress Note   Patient ID: Anthony [name], male   DOB: February 17, 1955, 63 y.o.   MRN: [id number]   Chief Complaint  Patient presents with  . Knee Pain    left     63 year old male had arthroscopy of the left knee with extensive synovitis from gout along with torn medial meniscus presents for evaluation of his left knee again.  He went back to work he is having a lot of pain when he is going up and down the steps at his job.  He is on Eliquis and has been taking Tylenol and not getting good pain relief.  PRE-OPERATIVE DIAGNOSIS:  torn medial meniscus left knee  POST-OPERATIVE DIAGNOSIS:  torn medial meniscus left knee and synovitis  FINDINGS: Synovitis of the joint mild, small tear posterior horn medial meniscus at the root, small chondral defect nonweightbearing surface lateral aspect medial femoral condyle remaining joint surfaces were intact there was some fibrinous debris throughout the joint  PROCEDURE:  Procedure(s): LEFT KNEE ARTHROSCOPY WITH MEDIAL MENISECTOMY AND SYNOVECTOMY (Left)  The operative findings are   Medial posterior horn medial meniscal tear at the root, lateral portion nonweightbearing surface medial femoral condyle chondral lesion 5 x 5 mm with fibrinous covering Lateral normal Patellofemoral normal Notch normal  Mild synovitis suprapatellar pouch medial gutter lateral gutter notch anterior compartment   SURGEON:  Surgeon(s) and Role:    Carole Civil, MD - Primary    Review of Systems  Musculoskeletal: Positive for joint pain.  Skin: Negative.   Neurological: Negative for tingling.     Allergies  Allergen Reactions  . Spironolactone     Bilateral gynecomastia  . Contrast Media [Iodinated Diagnostic Agents] Hives    Needs pre meds  . Isovue [Iopamidol] Hives    Needs pre meds  . Lipitor [Atorvastatin] Other (See Comments)    Aches and pains  . Nsaids Other (See Comments)    Told not to take   . Zocor [Simvastatin] Other (See  Comments)    Aches  . Acyclovir And Related Palpitations     BP 123/75   Pulse 63   Ht 5\' 11"  (1.803 m)   Wt 200 lb (90.7 kg)   BMI 27.89 kg/m   Physical Exam Vitals signs reviewed.  Constitutional:      Appearance: Normal appearance. He is well-developed.  Musculoskeletal:       Legs:  Skin:    General: Skin is warm and dry.     Findings: No erythema.  Neurological:     Mental Status: He is alert and oriented to person, place, and time.  Psychiatric:        Mood and Affect: Mood and affect normal.      Medical decisions:   Data  Imaging:   Prior imaging did not reveal any major degenerative changes please see arthroscopy dictation Encounter Diagnoses  Name Primary?  . S/P left knee arthroscopy 09/06/17 Yes  . Idiopathic chronic gout of left knee without tophus     PLAN:   The patient cannot take NSAIDs because he is on Eliquis Tylenol is not relieving his pain we have agreed that he can take an opioid using Ultracet as needed basis come back as needed  Meds ordered this encounter  Medications  . traMADol-acetaminophen (ULTRACET) 37.5-325 MG tablet    Sig: Take 1 tablet by mouth every 12 (twelve) hours as needed.    Dispense:  30 tablet    Refill:  0  Arther Abbott, MD 02/01/2018 9:23 AM

## 2018-02-07 ENCOUNTER — Other Ambulatory Visit: Payer: Self-pay | Admitting: Orthopedic Surgery

## 2018-02-13 ENCOUNTER — Other Ambulatory Visit: Payer: Self-pay | Admitting: Family Medicine

## 2018-02-15 ENCOUNTER — Other Ambulatory Visit: Payer: Self-pay | Admitting: Internal Medicine

## 2018-03-12 ENCOUNTER — Other Ambulatory Visit: Payer: Self-pay | Admitting: Orthopedic Surgery

## 2018-03-12 DIAGNOSIS — M1A0620 Idiopathic chronic gout, left knee, without tophus (tophi): Secondary | ICD-10-CM

## 2018-03-12 DIAGNOSIS — Z9889 Other specified postprocedural states: Secondary | ICD-10-CM

## 2018-03-14 ENCOUNTER — Other Ambulatory Visit: Payer: Self-pay | Admitting: Orthopedic Surgery

## 2018-03-17 ENCOUNTER — Other Ambulatory Visit: Payer: Self-pay | Admitting: Internal Medicine

## 2018-03-18 ENCOUNTER — Encounter (HOSPITAL_COMMUNITY): Payer: Self-pay

## 2018-03-18 ENCOUNTER — Other Ambulatory Visit: Payer: Self-pay

## 2018-03-18 ENCOUNTER — Emergency Department (HOSPITAL_COMMUNITY): Payer: 59

## 2018-03-18 ENCOUNTER — Emergency Department (HOSPITAL_COMMUNITY)
Admission: EM | Admit: 2018-03-18 | Discharge: 2018-03-18 | Disposition: A | Discharge status: Home / Self Care | Payer: 59 | Attending: Emergency Medicine | Admitting: Emergency Medicine

## 2018-03-18 DIAGNOSIS — Z79899 Other long term (current) drug therapy: Secondary | ICD-10-CM | POA: Insufficient documentation

## 2018-03-18 DIAGNOSIS — I10 Essential (primary) hypertension: Secondary | ICD-10-CM | POA: Insufficient documentation

## 2018-03-18 DIAGNOSIS — Z7901 Long term (current) use of anticoagulants: Secondary | ICD-10-CM | POA: Diagnosis not present

## 2018-03-18 DIAGNOSIS — R05 Cough: Secondary | ICD-10-CM | POA: Diagnosis present

## 2018-03-18 DIAGNOSIS — I4891 Unspecified atrial fibrillation: Secondary | ICD-10-CM | POA: Insufficient documentation

## 2018-03-18 DIAGNOSIS — R000 Tachycardia, unspecified: Secondary | ICD-10-CM | POA: Diagnosis not present

## 2018-03-18 DIAGNOSIS — E785 Hyperlipidemia, unspecified: Secondary | ICD-10-CM | POA: Diagnosis not present

## 2018-03-18 DIAGNOSIS — J101 Influenza due to other identified influenza virus with other respiratory manifestations: Secondary | ICD-10-CM | POA: Insufficient documentation

## 2018-03-18 LAB — INFLUENZA PANEL BY PCR (TYPE A & B)
INFLAPCR: POSITIVE — AB
Influenza B By PCR: NEGATIVE

## 2018-03-18 LAB — COMPREHENSIVE METABOLIC PANEL
ALT: 23 U/L (ref 0–44)
AST: 27 U/L (ref 15–41)
Albumin: 4.1 g/dL (ref 3.5–5.0)
Alkaline Phosphatase: 50 U/L (ref 38–126)
Anion gap: 11 (ref 5–15)
BUN: 18 mg/dL (ref 8–23)
CO2: 23 mmol/L (ref 22–32)
Calcium: 8.8 mg/dL — ABNORMAL LOW (ref 8.9–10.3)
Chloride: 103 mmol/L (ref 98–111)
Creatinine, Ser: 1.54 mg/dL — ABNORMAL HIGH (ref 0.61–1.24)
GFR calc Af Amer: 55 mL/min — ABNORMAL LOW (ref >60)
GFR, EST NON AFRICAN AMERICAN: 48 mL/min — AB (ref >60)
Glucose, Bld: 103 mg/dL — ABNORMAL HIGH (ref 70–99)
Potassium: 3.5 mmol/L (ref 3.5–5.1)
Sodium: 137 mmol/L (ref 135–145)
Total Bilirubin: 1 mg/dL (ref 0.3–1.2)
Total Protein: 7 g/dL (ref 6.5–8.1)

## 2018-03-18 LAB — CBC WITH DIFFERENTIAL/PLATELET
Abs Immature Granulocytes: 0.01 10*3/uL (ref 0.00–0.07)
Basophils Absolute: 0 10*3/uL (ref 0.0–0.1)
Basophils Relative: 0 %
Eosinophils Absolute: 0 10*3/uL (ref 0.0–0.5)
Eosinophils Relative: 1 %
HCT: 39.6 % (ref 39.0–52.0)
HEMOGLOBIN: 12.8 g/dL — AB (ref 13.0–17.0)
Immature Granulocytes: 0 %
Lymphocytes Relative: 12 %
Lymphs Abs: 0.6 10*3/uL — ABNORMAL LOW (ref 0.7–4.0)
MCH: 30.3 pg (ref 26.0–34.0)
MCHC: 32.3 g/dL (ref 30.0–36.0)
MCV: 93.6 fL (ref 80.0–100.0)
MONO ABS: 0.5 10*3/uL (ref 0.1–1.0)
Monocytes Relative: 10 %
Neutro Abs: 3.8 10*3/uL (ref 1.7–7.7)
Neutrophils Relative %: 77 %
Platelets: 142 10*3/uL — ABNORMAL LOW (ref 150–400)
RBC: 4.23 MIL/uL (ref 4.22–5.81)
RDW: 12.2 % (ref 11.5–15.5)
WBC: 5 10*3/uL (ref 4.0–10.5)
nRBC: 0 % (ref 0.0–0.2)

## 2018-03-18 LAB — LACTIC ACID, PLASMA
Lactic Acid, Venous: 2.4 mmol/L (ref 0.5–1.9)
Lactic Acid, Venous: 1.3 mmol/L (ref 0.5–1.9)

## 2018-03-18 MED ORDER — ALBUTEROL SULFATE (2.5 MG/3ML) 0.083% IN NEBU
5.0000 mg | INHALATION_SOLUTION | Freq: Once | RESPIRATORY_TRACT | Status: AC
  Administered 2018-03-18: 5 mg via RESPIRATORY_TRACT
  Filled 2018-03-18: qty 6

## 2018-03-18 MED ORDER — SODIUM CHLORIDE 0.9 % IV BOLUS
1000.0000 mL | Freq: Once | INTRAVENOUS | Status: AC
  Administered 2018-03-18: 1000 mL via INTRAVENOUS

## 2018-03-18 MED ORDER — DM-GUAIFENESIN ER 30-600 MG PO TB12
1.0000 | ORAL_TABLET | Freq: Two times a day (BID) | ORAL | Status: DC
  Administered 2018-03-18: 1 via ORAL
  Filled 2018-03-18: qty 1

## 2018-03-18 MED ORDER — OSELTAMIVIR PHOSPHATE 75 MG PO CAPS: 75.0000 mg | ORAL_CAPSULE | Freq: Two times a day (BID) | ORAL | 0 refills | Status: DC

## 2018-03-18 MED ORDER — ACETAMINOPHEN 500 MG PO TABS
1000.0000 mg | ORAL_TABLET | Freq: Once | ORAL | Status: AC
  Administered 2018-03-18: 1000 mg via ORAL
  Filled 2018-03-18: qty 2

## 2018-03-18 MED ORDER — OSELTAMIVIR PHOSPHATE 75 MG PO CAPS
75.0000 mg | ORAL_CAPSULE | Freq: Once | ORAL | Status: AC
  Administered 2018-03-18: 75 mg via ORAL
  Filled 2018-03-18: qty 1

## 2018-03-18 NOTE — ED Notes (Signed)
CRITICAL VALUE ALERT  Critical value received:  Lactic Acid: 2.4  Date of notification:  03/18/2018  Time of notification:  0817  Critical value read back:Yes.    Nurse who received alert:  Georgeanne Nim, RN  MD notified:  Tomi Bamberger @ [phone]

## 2018-03-18 NOTE — ED Triage Notes (Signed)
Pt reports cough, fever, headache, body aches onset yesterday
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
## 2018-03-18 NOTE — ED Provider Notes (Signed)
Assumed care at change of shift. Pt has influenza. BP on softer side. He says he is feeling better than when he came in. I got him up and walked him myself. He did so with a steady gait and says he would like to go home. I think this is reasonable at this time. I re-iterated return precautions.    Virgel Manifold, MD 03/18/18 1045

## 2018-03-18 NOTE — Discharge Instructions (Addendum)
Drink plenty of fluids.  Take acetaminophen 1000 mg every 6 hours for fever and body aches.  Take the Tamiflu until gone.  Take Mucinex DM over-the-counter for your cough.  Recheck if you get dehydrated, you struggle to breathe, or seem worse.

## 2018-03-22 ENCOUNTER — Encounter: Payer: Self-pay | Admitting: Family Medicine

## 2018-03-22 ENCOUNTER — Ambulatory Visit: Payer: 59 | Admitting: Family Medicine

## 2018-03-22 VITALS — BP 110/76 | Temp 97.5°F | Ht 71.0 in | Wt 193.8 lb

## 2018-03-22 DIAGNOSIS — J329 Chronic sinusitis, unspecified: Secondary | ICD-10-CM | POA: Diagnosis not present

## 2018-03-22 MED ORDER — AMOXICILLIN-POT CLAVULANATE 875-125 MG PO TABS: ORAL_TABLET | ORAL | 0 refills | Status: DC

## 2018-03-22 MED ORDER — ALBUTEROL SULFATE HFA 108 (90 BASE) MCG/ACT IN AERS: INHALATION_SPRAY | RESPIRATORY_TRACT | 2 refills | Status: DC

## 2018-03-22 MED ORDER — HYDROCODONE-HOMATROPINE 5-1.5 MG/5ML PO SYRP: ORAL_SOLUTION | ORAL | 0 refills | Status: DC

## 2018-03-22 NOTE — Progress Notes (Signed)
   Subjective:    Patient ID: Anthony [name], male    DOB: [birth year], 63 y.o.   MRN: [id number]  Sinusitis  This is a new problem. Episode onset: 5 days. Associated symptoms include coughing, ear pain and headaches. (Wheezing)   Went to ED on 03/18/18 and diagnosed with influenza A. Feeling a little better today.  Bad coughing feeling weak   Not feeling ny better  No inhaler No appettite    Using mucinex dm'  Review of Systems  HENT: Positive for ear pain.   Respiratory: Positive for cough.   Neurological: Positive for headaches.       Objective:   Physical Exam  Alert active moderate malaise hydration decent lungs wheezy cough clear no tachypnea heart regular rate and rhythm.      Assessment & Plan:  Impression post flu neck 3/reactive airway/persistent cough/fatigue Hycodan prescribed.  Albuterol prescribed proper use discussed.  Antibiotic prescribed for secondary infection

## 2018-04-03 ENCOUNTER — Other Ambulatory Visit: Payer: Self-pay | Admitting: Family Medicine

## 2018-04-03 DIAGNOSIS — M10062 Idiopathic gout, left knee: Secondary | ICD-10-CM

## 2018-04-11 ENCOUNTER — Encounter: Payer: 59 | Admitting: Family Medicine

## 2018-04-16 ENCOUNTER — Other Ambulatory Visit: Payer: Self-pay | Admitting: Family Medicine

## 2018-04-16 ENCOUNTER — Other Ambulatory Visit: Payer: Self-pay | Admitting: Internal Medicine

## 2018-04-25 ENCOUNTER — Other Ambulatory Visit: Payer: Self-pay | Admitting: Family Medicine

## 2018-04-25 NOTE — Telephone Encounter (Signed)
Needs six mo virtual visit

## 2018-04-26 ENCOUNTER — Telehealth: Payer: Self-pay

## 2018-04-26 ENCOUNTER — Encounter: Payer: Self-pay | Admitting: Internal Medicine

## 2018-04-26 ENCOUNTER — Telehealth (INDEPENDENT_AMBULATORY_CARE_PROVIDER_SITE_OTHER): Payer: 59 | Admitting: Internal Medicine

## 2018-04-26 VITALS — BP 134/72 | HR 69 | Wt 193.0 lb

## 2018-04-26 DIAGNOSIS — G4733 Obstructive sleep apnea (adult) (pediatric): Secondary | ICD-10-CM

## 2018-04-26 DIAGNOSIS — I4819 Other persistent atrial fibrillation: Secondary | ICD-10-CM

## 2018-04-26 DIAGNOSIS — I10 Essential (primary) hypertension: Secondary | ICD-10-CM | POA: Diagnosis not present
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
## 2018-04-26 NOTE — Telephone Encounter (Addendum)
Spoke with pt regarding appt on 04/26/18. Pt stated he will check vitals and upload an EKG to MyChart. Pt questions and concerns were address.

## 2018-05-16 ENCOUNTER — Other Ambulatory Visit: Payer: Self-pay | Admitting: Family Medicine

## 2018-05-29 ENCOUNTER — Telehealth: Payer: Self-pay | Admitting: Internal Medicine

## 2018-05-29 NOTE — Telephone Encounter (Signed)
New Message  Pt c/o medication issue:  1. Name of Medication: Eliquis  2. How are you currently taking this medication (dosage and times per day)? 5mg  1 tablet twice a day   3. Are you having a reaction (difficulty breathing--STAT)? NO  4. What is your medication issue?  Patient states he needs $10 copay card for Eliquis

## 2018-05-30 ENCOUNTER — Other Ambulatory Visit: Payer: Self-pay | Admitting: Family Medicine

## 2018-05-30 NOTE — Telephone Encounter (Signed)
Follow up:   Patient returning your call from yesterday. Please call patient back. Patient states all he needs is a co pay card.

## 2018-05-30 NOTE — Telephone Encounter (Signed)
Called pt and left message asking pt to call back to clarify exactly what he needed, if it was samples or a co-pay card for Eliquis.

## 2018-05-30 NOTE — Telephone Encounter (Signed)
Called pt back and pt stated that he would like for the copay card to be mail out to him. I informed pt that I would mail out the copay to him. I advised the pt that if he has any other problems, questions or concerns to call the office. Pt verbalized understanding.

## 2018-05-30 NOTE — Telephone Encounter (Signed)
Three mo ok 

## 2018-05-31 ENCOUNTER — Encounter: Payer: Self-pay | Admitting: Orthopedic Surgery

## 2018-05-31 ENCOUNTER — Other Ambulatory Visit: Payer: Self-pay

## 2018-05-31 ENCOUNTER — Ambulatory Visit: Payer: 59 | Admitting: Orthopedic Surgery

## 2018-05-31 VITALS — BP 144/84 | HR 72 | Temp 97.4°F | Ht 71.0 in | Wt 193.0 lb

## 2018-05-31 DIAGNOSIS — Z9889 Other specified postprocedural states: Secondary | ICD-10-CM | POA: Diagnosis not present

## 2018-05-31 DIAGNOSIS — M1712 Unilateral primary osteoarthritis, left knee: Secondary | ICD-10-CM

## 2018-05-31 DIAGNOSIS — M1A0620 Idiopathic chronic gout, left knee, without tophus (tophi): Secondary | ICD-10-CM

## 2018-05-31 MED ORDER — TRAMADOL-ACETAMINOPHEN 37.5-325 MG PO TABS: 1.0000 | ORAL_TABLET | Freq: Two times a day (BID) | ORAL | 0 refills | Status: DC | PRN

## 2018-05-31 NOTE — Progress Notes (Signed)
Chief Complaint  Patient presents with  . Knee Pain    left    62 yo left knee pain . Good and bad days   Seems to be work related and activity related  Medication as needed works pretty good     Review of systems no swelling no fever  Physical Exam Vitals signs and nursing note reviewed.  Constitutional:      Appearance: Normal appearance.  Musculoskeletal:       Legs:  Neurological:     Mental Status: He is alert and oriented to person, place, and time.  Psychiatric:        Mood and Affect: Mood normal.    Encounter Diagnoses  Name Primary?  . S/P left knee arthroscopy 09/06/17   . Idiopathic chronic gout of left knee without tophus Yes  . Primary osteoarthritis of left knee     Meds ordered this encounter  Medications  . traMADol-acetaminophen (ULTRACET) 37.5-325 MG tablet    Sig: Take 1 tablet by mouth every 12 (twelve) hours as needed.    Dispense:  60 tablet    Refill:  0    Stable condition intermittent flares using allopurinol and tramadol as needed with acetaminophen  Follow-up as needed

## 2018-06-11 ENCOUNTER — Other Ambulatory Visit: Payer: Self-pay | Admitting: Family Medicine

## 2018-06-16 ENCOUNTER — Other Ambulatory Visit: Payer: Self-pay | Admitting: Family Medicine

## 2018-06-20 ENCOUNTER — Ambulatory Visit: Payer: 59 | Admitting: Orthopaedic Surgery

## 2018-07-13 ENCOUNTER — Other Ambulatory Visit: Payer: Self-pay | Admitting: Family Medicine

## 2018-07-13 ENCOUNTER — Other Ambulatory Visit: Payer: Self-pay | Admitting: Internal Medicine

## 2018-07-13 DIAGNOSIS — M10062 Idiopathic gout, left knee: Secondary | ICD-10-CM

## 2018-07-13 NOTE — Telephone Encounter (Signed)
Schedule virtual visit May have 1 refill of each

## 2018-07-17 NOTE — Telephone Encounter (Signed)
Pt has scheduled VIRTUAL visit 7/8   He would also like a refill on SUMAtriptan (IMITREX) 50 MG tablet

## 2018-07-17 NOTE — Telephone Encounter (Signed)
Ok one refill all

## 2018-07-19 ENCOUNTER — Telehealth: Payer: Self-pay | Admitting: Orthopedic Surgery

## 2018-07-19 NOTE — Telephone Encounter (Signed)
Thanks, I have called him to advise  

## 2018-07-19 NOTE — Telephone Encounter (Signed)
No take the meds hes on drink plenty of fluids avoid triggering foods

## 2018-07-19 NOTE — Telephone Encounter (Signed)
Anthony [name] called stating he is having a flare up of gout in his right foot.  He stated he takes Allopurinol everyday and Febuxostat.  He wants to know if there is something else he can take?  He states he uses [facility] on [address]

## 2018-07-26 ENCOUNTER — Ambulatory Visit (INDEPENDENT_AMBULATORY_CARE_PROVIDER_SITE_OTHER): Payer: 59 | Admitting: Family Medicine

## 2018-07-26 ENCOUNTER — Other Ambulatory Visit: Payer: Self-pay

## 2018-07-26 ENCOUNTER — Telehealth: Payer: Self-pay | Admitting: Family Medicine

## 2018-07-26 DIAGNOSIS — F5101 Primary insomnia: Secondary | ICD-10-CM | POA: Diagnosis not present

## 2018-07-26 DIAGNOSIS — M10062 Idiopathic gout, left knee: Secondary | ICD-10-CM

## 2018-07-26 DIAGNOSIS — E785 Hyperlipidemia, unspecified: Secondary | ICD-10-CM

## 2018-07-26 DIAGNOSIS — I10 Essential (primary) hypertension: Secondary | ICD-10-CM

## 2018-07-26 MED ORDER — VERAPAMIL HCL ER 240 MG PO CP24: 240.0000 mg | ORAL_CAPSULE | Freq: Every day | ORAL | 1 refills | Status: DC

## 2018-07-26 MED ORDER — LISINOPRIL 20 MG PO TABS: 20.0000 mg | ORAL_TABLET | Freq: Two times a day (BID) | ORAL | 5 refills | Status: DC

## 2018-07-26 MED ORDER — EZETIMIBE 10 MG PO TABS: ORAL_TABLET | ORAL | 1 refills | Status: DC

## 2018-07-26 MED ORDER — PREDNISONE 20 MG PO TABS: ORAL_TABLET | ORAL | 0 refills | Status: DC

## 2018-07-26 MED ORDER — COLCHICINE 0.6 MG PO TABS: ORAL_TABLET | ORAL | 5 refills | Status: DC

## 2018-07-26 MED ORDER — PANTOPRAZOLE SODIUM 40 MG PO TBEC: 40.0000 mg | DELAYED_RELEASE_TABLET | Freq: Every day | ORAL | 1 refills | Status: DC

## 2018-07-26 MED ORDER — HYDROCHLOROTHIAZIDE 25 MG PO TABS: ORAL_TABLET | ORAL | 1 refills | Status: DC

## 2018-07-26 MED ORDER — ESCITALOPRAM OXALATE 10 MG PO TABS: ORAL_TABLET | ORAL | 5 refills | Status: DC

## 2018-07-26 NOTE — Progress Notes (Signed)
   Subjective:    Patient ID: Anthony [name], male    DOB: 1955/07/13, 63 y.o.   MRN: [id number] Audio plus video Hypertension This is a chronic problem. The current episode started more than 1 year ago. Risk factors for coronary artery disease include male gender. Treatments tried: hctz, lisinopril, toprol, verapamil. There are no compliance problems.    Patient would like to discuss gout meds- ortho put him on allopurinol and urloric for flares but still having flares  Virtual Visit via Video Note  I connected with [name] on 07/26/18 at  8:30 AM EDT by a video enabled telemedicine application and verified that I am speaking with the correct person using two identifiers.  Location: Patient: home Provider: office   I discussed the limitations of evaluation and management by telemedicine and the availability of in person appointments. The patient expressed understanding and agreed to proceed.  History of Present Illness:    Observations/Objective:   Assessment and Plan:   Follow Up Instructions:    I discussed the assessment and treatment plan with the patient. The patient was provided an opportunity to ask questions and all were answered. The patient agreed with the plan and demonstrated an understanding of the instructions.   The patient was advised to call back or seek an in-person evaluation if the symptoms worsen or if the condition fails to improve as anticipated.  I provided 25 minutes of non-face-to-face time during this encounter.  Patient compliant with insomnia medication. Generally takes most nights. No obvious morning drowsiness. Definitely helps patient sleep. Without it patient states would not get a good nights rest.  Blood pressure medicine and blood pressure levels reviewed today with patient. Compliant with blood pressure medicine. States does not miss a dose. No obvious side effects. Blood pressure generally good when checked elsewhere. Watching salt  intake.   Patient has ongoing challenges with gout.  Discussed.  Recent flare.     Review of Systems No headache, no major weight loss or weight gain, no chest pain no back pain abdominal pain no change in bowel habits complete ROS otherwise negative     Objective:   Physical Exam  Virtual      Assessment & Plan:  Impression hypertension.  Good control discussed maintain same meds.  2.  Insomnia.  Ongoing.  Substantial with ongoing need for medications  3.  Migraines clinically stable patient wishes to maintain same meds will refill  4.  Gout.  Ongoing.  Somehow patient got on both allopurinol and Uloric.  Encouraged to stop the allopurinol.  Will give a prednisone taper and add colchicine.  Addendum patient's insurance covers colchicine poorly we will press on with rheumatology referral rationale discussed  Greater than 50% of this 25 minute face to face visit was spent in counseling and discussion and coordination of care regarding the above diagnosis/diagnosies

## 2018-07-26 NOTE — Telephone Encounter (Signed)
Prescription sent electronically to pharmacy. 

## 2018-07-26 NOTE — Telephone Encounter (Signed)
PHARMACY CALLING TO SEE IF WE CAN CHANGE THE colchicine 0.6 MG tablet TO BRAND NAME CAPSULES PER INSURANCE

## 2018-07-26 NOTE — Telephone Encounter (Signed)
ok 

## 2018-07-27 ENCOUNTER — Telehealth: Payer: Self-pay | Admitting: Family Medicine

## 2018-07-27 DIAGNOSIS — M10062 Idiopathic gout, left knee: Secondary | ICD-10-CM

## 2018-07-27 NOTE — Telephone Encounter (Signed)
Pt contacted and verbalized understanding. Pt states that he has stopped taking Allopurinol. Pt states that he would like to see rheumatologist.( Pt would like to see a rheumatologist in [place] or [place]; is willing to go to [place])  Please advise. Thank you

## 2018-07-27 NOTE — Telephone Encounter (Signed)
Referral placed and pt is aware. 

## 2018-07-27 NOTE — Telephone Encounter (Signed)
I am sorry there is no different medicine in this class. He really should not be taking both the allopurinol and the uloric, they basically do the same thing but the uloric is a bit stronger, so, still stop allopurinol. There are rheumatologists who manage this disease and it may be time for him to see one.

## 2018-07-27 NOTE — Telephone Encounter (Signed)
Generic colchicine is not covered by insurance and cash pay is $170 and the brand name caps covered by insurance have $100 copay and the patient states he can not afford it

## 2018-07-27 NOTE — Telephone Encounter (Signed)
Ok lets do ref

## 2018-07-27 NOTE — Telephone Encounter (Signed)
Pt is calling today stating the brand name of the gout medication is going to cost him over a $100 a month and would like a different medication called in.

## 2018-07-28 ENCOUNTER — Encounter: Payer: Self-pay | Admitting: Family Medicine

## 2018-07-28 ENCOUNTER — Telehealth: Payer: Self-pay | Admitting: Family Medicine

## 2018-07-28 NOTE — Telephone Encounter (Signed)
Need 07/26/2018 virtual visit note completed so I may send with referral

## 2018-07-29 ENCOUNTER — Encounter: Payer: Self-pay | Admitting: Family Medicine

## 2018-07-31 ENCOUNTER — Telehealth: Payer: Self-pay | Admitting: Family Medicine

## 2018-07-31 ENCOUNTER — Other Ambulatory Visit: Payer: Self-pay | Admitting: Orthopedic Surgery

## 2018-07-31 DIAGNOSIS — Z9889 Other specified postprocedural states: Secondary | ICD-10-CM

## 2018-07-31 DIAGNOSIS — M1A0620 Idiopathic chronic gout, left knee, without tophus (tophi): Secondary | ICD-10-CM

## 2018-07-31 MED ORDER — SUMATRIPTAN SUCCINATE 50 MG PO TABS: ORAL_TABLET | ORAL | 5 refills | Status: DC

## 2018-07-31 NOTE — Telephone Encounter (Signed)
Pt is checking on SUMAtriptan (IMITREX) 50 MG tablet being sent to pharmacy. States Dr. Richardson Landry and him discussed this last week.   [id number] - [place], [address] AT [address]

## 2018-07-31 NOTE — Telephone Encounter (Signed)
Prescription resent electronically to pharmacy. Patient notified.

## 2018-08-07 ENCOUNTER — Other Ambulatory Visit: Payer: Self-pay | Admitting: Internal Medicine

## 2018-08-07 NOTE — Telephone Encounter (Signed)
62yom 87.5kg Scr 1.54 03/18/18 Lovw/allred 04/26/18

## 2018-08-10 ENCOUNTER — Other Ambulatory Visit: Payer: Self-pay | Admitting: Family Medicine

## 2018-08-21 ENCOUNTER — Other Ambulatory Visit: Payer: Self-pay | Admitting: Family Medicine

## 2018-10-02 ENCOUNTER — Ambulatory Visit (INDEPENDENT_AMBULATORY_CARE_PROVIDER_SITE_OTHER): Payer: 59 | Admitting: Family Medicine

## 2018-10-02 ENCOUNTER — Encounter: Payer: Self-pay | Admitting: Family Medicine

## 2018-10-02 DIAGNOSIS — R21 Rash and other nonspecific skin eruption: Secondary | ICD-10-CM

## 2018-10-02 MED ORDER — DOXYCYCLINE HYCLATE 100 MG PO TABS: ORAL_TABLET | ORAL | 0 refills | Status: DC

## 2018-10-02 NOTE — Progress Notes (Signed)
   Subjective:    Patient ID: Anthony [name], male    DOB: 10/16/1955, 63 y.o.   MRN: [id number] Audio plus video HPI Pt has had tick bite over the past couple weeks. One on back, one under belly button, one on hamstring and one on thigh. Pt states he did remove ticks. Pt states that areas do itch and are tender. Pt has put Neosporin on area and cortisone cream to help itching. No fever, no fatigue, no muscle aches.   Virtual Visit via Video Note  I connected with [name] on 10/02/18 at 11:00 AM EDT by a video enabled telemedicine application and verified that I am speaking with the correct person using two identifiers.  Location: Patient: home Provider: office   I discussed the limitations of evaluation and management by telemedicine and the availability of in person appointments. The patient expressed understanding and agreed to proceed.  History of Present Illness:    Observations/Objective:   Assessment and Plan:   Follow Up Instructions:    I discussed the assessment and treatment plan with the patient. The patient was provided an opportunity to ask questions and all were answered. The patient agreed with the plan and demonstrated an understanding of the instructions.   The patient was advised to call back or seek an in-person evaluation if the symptoms worsen or if the condition fails to improve as anticipated.  I provided 18 minutes of non-face-to-face time during this encounter.    Vicente Males, LPN Patient getting outdoors a lot.  Has had 3 separate tick bites.  2 were deer ticks.  One regular with tick size.  Patient notes achiness in the joints.  Definitely worse than usual.  No fever.  No rash elsewhere.  Is developed a red itchy irritated rash at the site of 1 of the tick bites   Review of Systems No abdominal pain no shortness of breath no cough    Objective:   Physical Exam   Virtual     Assessment & Plan:  Impression multiple sick bites  with both localized rash and now systemic features of substantial arthralgias.  Patient would like to be covered for potential of tickborne illness without classic rash.  Discussed.  We will go ahead and cover with Doxy 100 twice daily

## 2018-10-05 ENCOUNTER — Other Ambulatory Visit: Payer: Self-pay | Admitting: Family Medicine

## 2018-10-10 ENCOUNTER — Other Ambulatory Visit: Payer: Self-pay | Admitting: Family Medicine

## 2018-10-10 NOTE — Telephone Encounter (Signed)
Six mo 

## 2018-10-11 ENCOUNTER — Telehealth: Payer: Self-pay | Admitting: Family Medicine

## 2018-10-11 NOTE — Telephone Encounter (Signed)
Script faxed to pharmacy

## 2018-10-11 NOTE — Telephone Encounter (Signed)
Patient called back to check on Ambien RX that was sent in yesterday.  I told patient it looks like doctor approved refills and that it should be done later today.  No call back  needed unless there is a problem. I told patient to check with the pharmacy later today.

## 2018-10-27 ENCOUNTER — Other Ambulatory Visit: Payer: Self-pay | Admitting: Family Medicine

## 2018-10-31 ENCOUNTER — Encounter: Payer: Self-pay | Admitting: Family Medicine

## 2018-10-31 ENCOUNTER — Other Ambulatory Visit: Payer: Self-pay

## 2018-10-31 ENCOUNTER — Ambulatory Visit (INDEPENDENT_AMBULATORY_CARE_PROVIDER_SITE_OTHER): Payer: 59 | Admitting: Family Medicine

## 2018-10-31 VITALS — BP 132/80 | Temp 98.0°F | Ht 71.0 in | Wt 193.0 lb

## 2018-10-31 DIAGNOSIS — M79605 Pain in left leg: Secondary | ICD-10-CM

## 2018-10-31 DIAGNOSIS — S76212A Strain of adductor muscle, fascia and tendon of left thigh, initial encounter: Secondary | ICD-10-CM

## 2018-10-31 NOTE — Progress Notes (Signed)
   Subjective:    Patient ID: Anthony [name], male    DOB: 08/20/1955, 63 y.o.   MRN: [id number]  HPIpain in left groin area. Started 5 days ago. Pain is worse when walking. Treatments tried rest.   Was doing some stuff nd it got to hurting  Pt thought he might have xome swelling  Not as tender but occurs off and   No h of hernia    Work ok       Review of Systems No headache, no major weight loss or weight gain, no chest pain no back pain abdominal pain no change in bowel habits complete ROS otherwise negative     Objective:   Physical Exam  Alert vitals stable, NAD. Blood pressure good on repeat. HEENT normal. Lungs clear. Heart regular rate and rhythm. Left inguinal region.  No hernia.  Testicle within normal limits some tenderness at site of inguinal but out left      Assessment & Plan:  Impression inguinal strain plan local measures discussed expect swelling slow resolution warning signs discussed

## 2018-11-12 ENCOUNTER — Other Ambulatory Visit: Payer: Self-pay | Admitting: Family Medicine

## 2018-11-13 DIAGNOSIS — Z029 Encounter for administrative examinations, unspecified: Secondary | ICD-10-CM

## 2018-11-16 ENCOUNTER — Other Ambulatory Visit: Payer: Self-pay | Admitting: Family Medicine

## 2018-11-16 ENCOUNTER — Telehealth: Payer: Self-pay | Admitting: Family Medicine

## 2018-11-16 DIAGNOSIS — M10062 Idiopathic gout, left knee: Secondary | ICD-10-CM

## 2018-11-16 NOTE — Telephone Encounter (Signed)
Patient brought in FMLA to be completed for his job due to strain muscle and back pain.I completed what I could but not enough information to complete form. Please complete highlighted areas on form,date and sign.

## 2018-11-24 ENCOUNTER — Telehealth: Payer: Self-pay | Admitting: Family Medicine

## 2018-11-24 NOTE — Telephone Encounter (Signed)
Patient needs FMLA forms faxed to [phone] and the original mailed to him.  Also would like a message left on his cell # when all this has been completed.

## 2018-11-24 NOTE — Telephone Encounter (Signed)
Forms are up front for pick up

## 2019-01-01 ENCOUNTER — Other Ambulatory Visit: Payer: Self-pay | Admitting: Family Medicine

## 2019-01-11 ENCOUNTER — Other Ambulatory Visit: Payer: Self-pay

## 2019-01-11 DIAGNOSIS — M1A0620 Idiopathic chronic gout, left knee, without tophus (tophi): Secondary | ICD-10-CM

## 2019-01-11 DIAGNOSIS — Z9889 Other specified postprocedural states: Secondary | ICD-10-CM

## 2019-01-15 MED ORDER — TRAMADOL-ACETAMINOPHEN 37.5-325 MG PO TABS: 1.0000 | ORAL_TABLET | Freq: Two times a day (BID) | ORAL | 0 refills | Status: DC | PRN

## 2019-01-17 ENCOUNTER — Other Ambulatory Visit: Payer: Self-pay

## 2019-01-17 ENCOUNTER — Telehealth: Payer: Self-pay | Admitting: Unknown Physician Specialty

## 2019-01-17 ENCOUNTER — Ambulatory Visit (INDEPENDENT_AMBULATORY_CARE_PROVIDER_SITE_OTHER): Payer: 59 | Admitting: Family Medicine

## 2019-01-17 ENCOUNTER — Encounter: Payer: Self-pay | Admitting: Family Medicine

## 2019-01-17 DIAGNOSIS — U071 COVID-19: Secondary | ICD-10-CM

## 2019-01-17 MED ORDER — DOXYCYCLINE HYCLATE 100 MG PO TABS: ORAL_TABLET | ORAL | 0 refills | Status: DC

## 2019-01-17 NOTE — Telephone Encounter (Signed)
Discuss with patient about Covid symptoms and the use of bamlanivimab, a monoclonal antibody infusion for those with mild to moderate Covid symptoms and at a high risk of hospitalization.  Pt is qualified for this infusion at the [facility] infusion center due to Age >55 and Hypertension   Sx onset 12/23.  Will try to schedule this week.  Next open infusion date will be outside of 10 day window of sx onset.

## 2019-01-17 NOTE — Telephone Encounter (Signed)
Pt was contacted by the [facility]ab infusion center and declines treatment.

## 2019-01-17 NOTE — Progress Notes (Signed)
   Subjective:    Patient ID: Anthony [name], male    DOB: February 26, 1955, 63 y.o.   MRN: [id number]  HPI Pt went to Urgent Care on Dec 24 and tested positive for COVID. Pt is having chills, fever, aches, cough. Pt was prescribed Hycodan cough syrup by Urgent Care provider.  Virtual Visit via Telephone Note  I connected with [name] on 01/17/19 at 10:00 AM EST by telephone and verified that I am speaking with the correct person using two identifiers.  Location: Patient: home Provider: office   I discussed the limitations, risks, security and privacy concerns of performing an evaluation and management service by telephone and the availability of in person appointments. I also discussed with the patient that there may be a patient responsible charge related to this service. The patient expressed understanding and agreed to proceed.   History of Present Illness:    Observations/Objective:   Assessment and Plan:   Follow Up Instructions:   hit one week ago  Was around someone sick  achey  Chills   Feeling achey   Gave hydrcod cough meds  Using tylenol  Ongoing fever daily   Gets sob  I discussed the assessment and treatment plan with the patient. The patient was provided an opportunity to ask questions and all were answered. The patient agreed with the plan and demonstrated an understanding of the instructions.   The patient was advised to call back or seek an in-person evaluation if the symptoms worsen or if the condition fails to improve as anticipated.  I provided 75minutes of non-face-to-face time during this encounter.  93 per cent   Seldom productive   Using inhaler   Energy zapped  Vicente Males, LPN   Review of Systems No vomiting no diarrhea    Objective:   Physical Exam   Virtual     Assessment & Plan:  Impression COVID-19.  Multiple risk factors for clinical worsening.  Ongoing symptoms.  Day 7.  Concerning.  Will add Doxy 100  twice daily.  I called the monoclonal antibody folks today feel he may be a candidate.  They will reach out to him.  This was discussed with patient.  Numerous questions answered.  Encourage to increase albuterol to 4 times daily  Greater than 50% of this 25 minute non-face to face visit was spent in counseling and discussion and coordination of care regarding the above diagnosis/diagnosies

## 2019-01-18 ENCOUNTER — Telehealth: Payer: Self-pay | Admitting: Family Medicine

## 2019-01-18 ENCOUNTER — Other Ambulatory Visit: Payer: Self-pay | Admitting: Unknown Physician Specialty

## 2019-01-18 NOTE — Telephone Encounter (Signed)
Contacted the Monoclonal Antibody center and asked them to reconnect with patient because he would like to proceed with treatment. Patient notified the center will be contacting him to discuss options and verbalized understanding.

## 2019-01-18 NOTE — Telephone Encounter (Signed)
Patient was contacted by the antibody clinic yesterday and declined appointment

## 2019-01-18 NOTE — Telephone Encounter (Signed)
Pt would like to move forward with anti-viral infusion in [place]. He is hoping to be able to go today.

## 2019-01-18 NOTE — Telephone Encounter (Signed)
We have zero input on this, must go thru channels as discussed yesterday with pt(and apparently he did???) rediscuss channels for pt but I told him yesterday there were very few slots available

## 2019-01-21 IMAGING — DX DG CHEST 2V
2 series · 2 of 2 positions shown · non-contrast
Comparison: March 20, 2015

CLINICAL DATA: Chills and nausea

EXAM:
CHEST  2 VIEW

[chest pa]
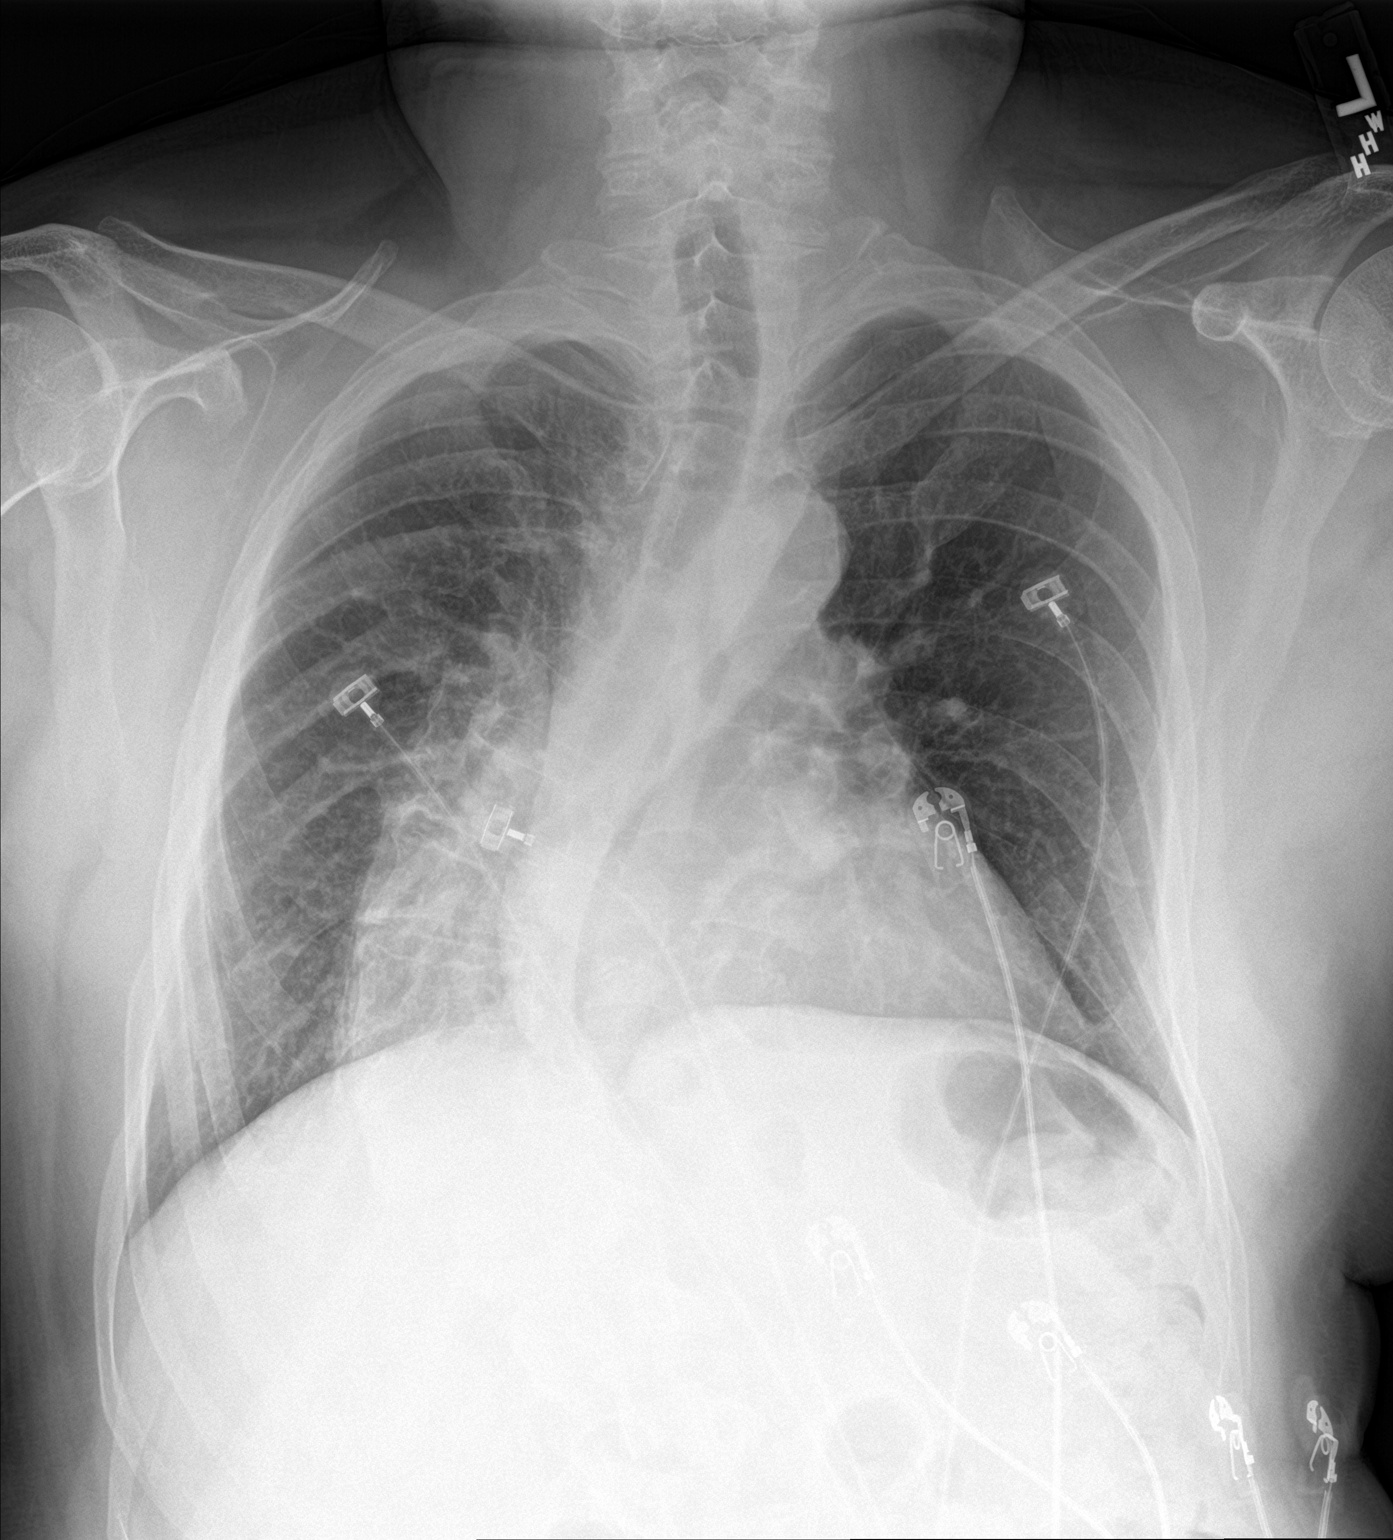

[chest lat]
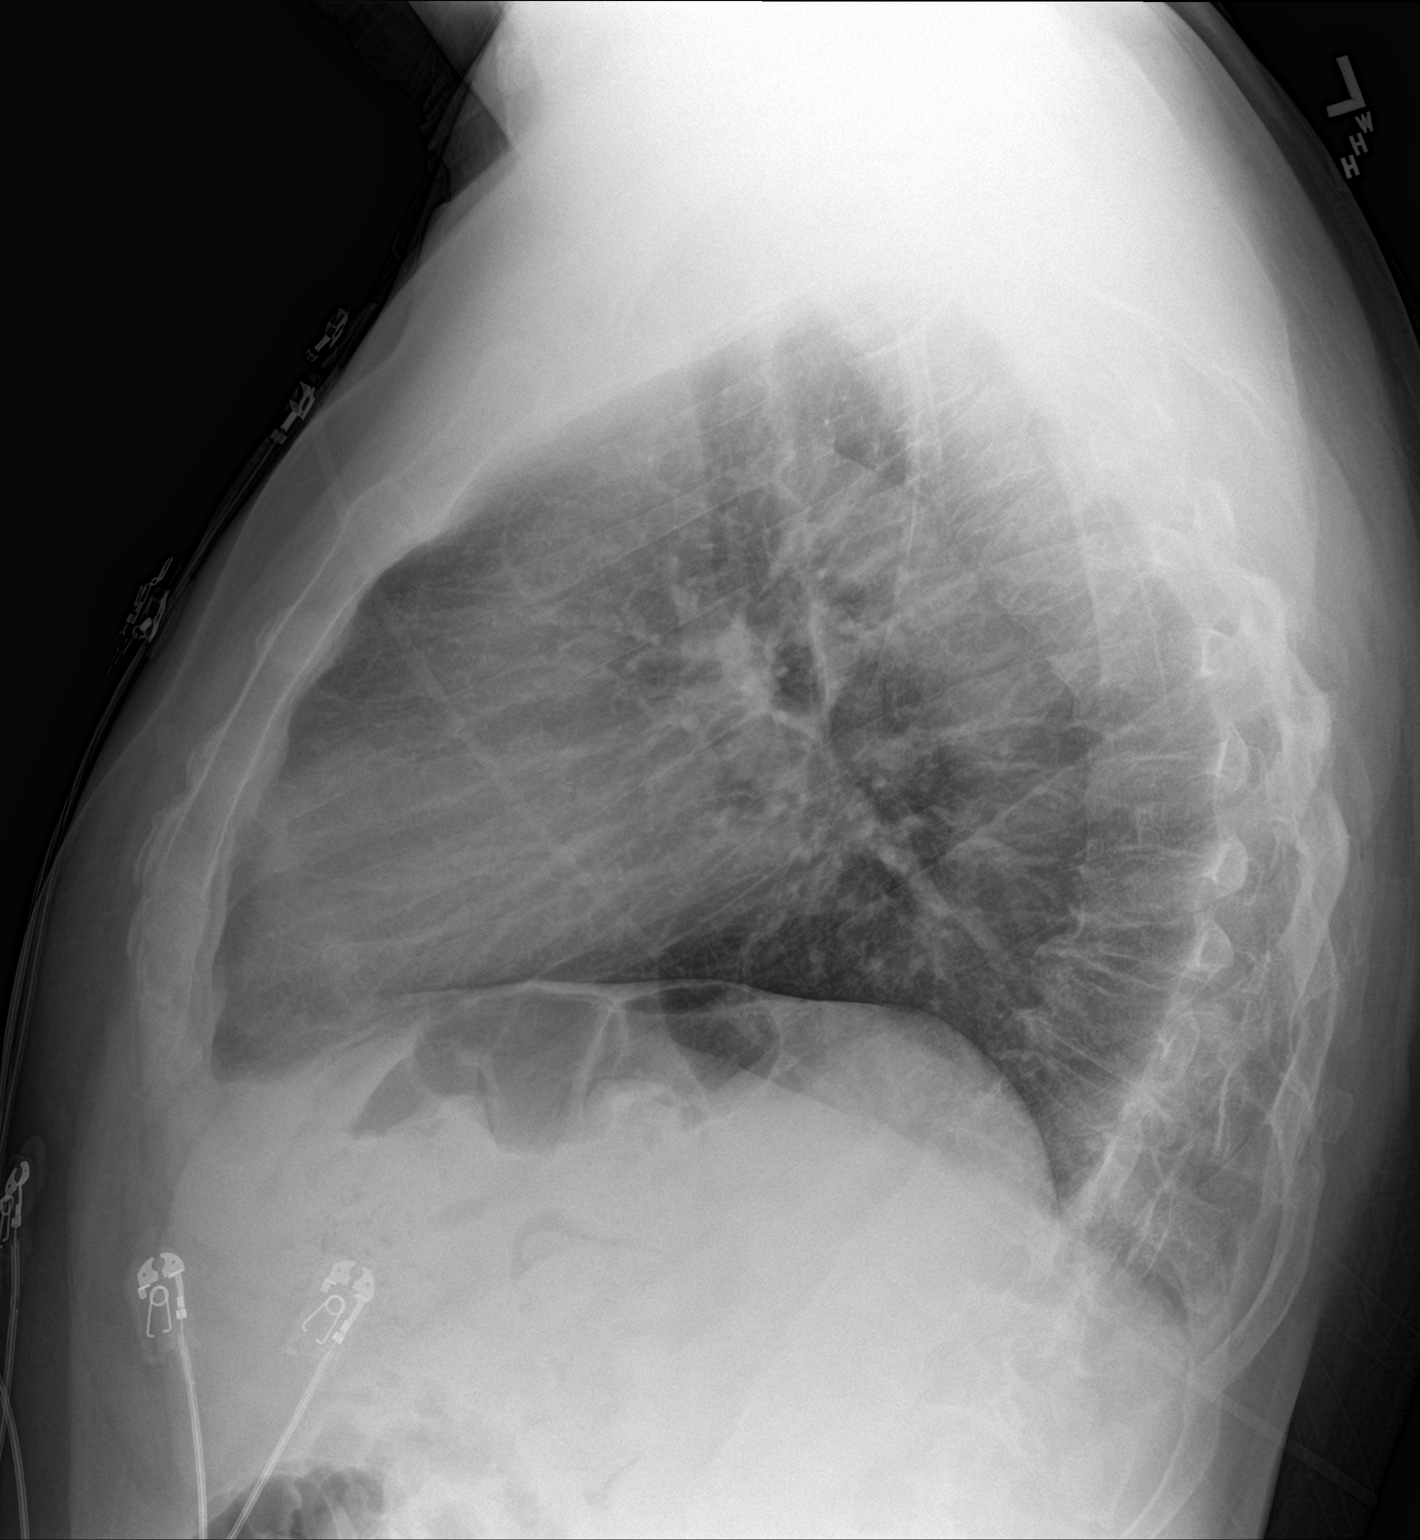

[2 of 2 positions shown; findings below may reference images not displayed]

FINDINGS: There is no edema or consolidation. Heart is upper normal in size
with pulmonary vascularity within normal limits. No adenopathy.
There is atherosclerotic calcification in the aorta. There is
thoracic dextroscoliosis with rotatory component. There is
degenerative change in the thoracic spine.
IMPRESSION: Scoliosis.  No edema or consolidation.  Aortic atherosclerosis.

## 2019-01-22 ENCOUNTER — Encounter: Payer: Self-pay | Admitting: Family Medicine

## 2019-01-22 ENCOUNTER — Telehealth: Payer: Self-pay | Admitting: Family Medicine

## 2019-01-22 NOTE — Telephone Encounter (Signed)
Please advise. Thank you

## 2019-01-22 NOTE — Telephone Encounter (Signed)
Pt would like a work note starting today for a couple weeks to be out of work due to [employer]. He is not sure how long it can linger. Pt had virtual 12/30

## 2019-01-22 NOTE — Telephone Encounter (Signed)
Work excuse times two weeks at pts request

## 2019-01-31 ENCOUNTER — Other Ambulatory Visit: Payer: Self-pay | Admitting: Internal Medicine

## 2019-01-31 NOTE — Telephone Encounter (Signed)
Pt last saw Dr Rayann Heman 04/26/18 video visit Covid-19, last labs 08/01/18 Creat 1.31, age 64, weight 87.5kg, based on specified criteria pt is on appropriate dosage of Eliquis 5mg  BID.  Will refill rx.

## 2019-02-01 ENCOUNTER — Telehealth: Payer: Self-pay | Admitting: Family Medicine

## 2019-02-01 MED ORDER — HYDROCODONE-HOMATROPINE 5-1.5 MG/5ML PO SYRP: ORAL_SOLUTION | ORAL | 0 refills | Status: DC

## 2019-02-01 NOTE — Telephone Encounter (Signed)
Tested positive for covid at urgent care on the 24th. Had visit on 12/30. Cough is better but still lingering. Sometimes he feels like it is hard to breath. Uses his inhaler and it does help. O2 level has been 95 -97. Would  like to see if he can get some hycodan cough syrup. Not resting well at night due to cough. No fever in about one week.

## 2019-02-01 NOTE — Telephone Encounter (Signed)
Pt notified med would be sent later today. It is pended and ready for signature

## 2019-02-01 NOTE — Telephone Encounter (Signed)
Pant states still has bad cough from Covid and would like something called into [facility] [address]

## 2019-02-01 NOTE — Telephone Encounter (Signed)
Hycodan 3 oz ontspn q six hrs prn

## 2019-02-05 ENCOUNTER — Telehealth: Payer: Self-pay | Admitting: Family Medicine

## 2019-02-05 NOTE — Telephone Encounter (Signed)
Lets do it

## 2019-02-05 NOTE — Telephone Encounter (Signed)
Pt would like work note extended 2 weeks. He is out for COVID and suppose to return to work tonight but is still having cough, body aches and feeing drained.

## 2019-02-06 ENCOUNTER — Encounter: Payer: Self-pay | Admitting: Family Medicine

## 2019-02-13 ENCOUNTER — Telehealth: Payer: Self-pay | Admitting: Family Medicine

## 2019-02-13 NOTE — Telephone Encounter (Signed)
Patient had [employer] faxed over form to be signed and dated in your folder for review. Records and work note are attached to be faxed in when complete.

## 2019-02-13 NOTE — Telephone Encounter (Signed)
Please advise. Thank you

## 2019-02-16 ENCOUNTER — Other Ambulatory Visit: Payer: Self-pay | Admitting: Family Medicine

## 2019-02-16 DIAGNOSIS — Z029 Encounter for administrative examinations, unspecified: Secondary | ICD-10-CM

## 2019-02-16 NOTE — Telephone Encounter (Signed)
Seen December for covid

## 2019-02-20 ENCOUNTER — Other Ambulatory Visit: Payer: Self-pay

## 2019-02-20 ENCOUNTER — Ambulatory Visit (INDEPENDENT_AMBULATORY_CARE_PROVIDER_SITE_OTHER): Payer: 59 | Admitting: Family Medicine

## 2019-02-20 ENCOUNTER — Telehealth: Payer: Self-pay | Admitting: Family Medicine

## 2019-02-20 ENCOUNTER — Encounter: Payer: Self-pay | Admitting: Family Medicine

## 2019-02-20 DIAGNOSIS — R05 Cough: Secondary | ICD-10-CM | POA: Diagnosis not present

## 2019-02-20 DIAGNOSIS — R059 Cough, unspecified: Secondary | ICD-10-CM

## 2019-02-20 DIAGNOSIS — R0602 Shortness of breath: Secondary | ICD-10-CM

## 2019-02-20 NOTE — Progress Notes (Signed)
   Subjective:  Audio  Patient ID: Anthony [name], male    DOB: July 15, 1955, 64 y.o.   MRN: [id number]  HPI Patient calls for follow up on Covid. Patient states he still has SOB and pain on left side when coughing since his positive result 01/11/2019. Patient has been unable to return to work since 01/14/2019 and has decided he is going to retire to avoid further covid exposures.  Virtual Visit via Video Note  I connected with Anthony [name] on 02/20/19 at 11:00 AM EST by a video enabled telemedicine application and verified that I am speaking with the correct person using two identifiers.  Location: Patient: home Provider: office   I discussed the limitations of evaluation and management by telemedicine and the availability of in person appointments. The patient expressed understanding and agreed to proceed.  History of Present Illness:    Observations/Objective:   Assessment and Plan:   Follow Up Instructions:    I discussed the assessment and treatment plan with the patient. The patient was provided an opportunity to ask questions and all were answered. The patient agreed with the plan and demonstrated an understanding of the instructions.   The patient was advised to call back or seek an in-person evaluation if the symptoms worsen or if the condition fails to improve as anticipated.  I provided 20 minutes of non-face-to-face time during this encounter.   Patient continues to have persistent symptomatology after confirmed COVID-19 infection.  Oxygen saturations remain around 96%.  Chest pain left-sided.  Worse with cough.  Cough is nonproductive.  Dry.  No fever.  No chills.  Appetite improving.  Fatigue still present.    Review of Systems See above    Objective:   Physical Exam   Virtual     Assessment & Plan:  COVID-19 infection with residual probable chest wall pain and element of shortness of breath.  Recommend chest x-ray rationale discussed further  recommendations based on results.  Patient has elected to come out of work long-term and retired rather than face the possibility of getting this for second time

## 2019-02-21 ENCOUNTER — Telehealth: Payer: Self-pay | Admitting: Family Medicine

## 2019-02-21 NOTE — Telephone Encounter (Signed)
Patient has another [employer] form to be completed for his wages didn't have return date in office notes so didn't know what to put in highlighted area since patient has been out of work since 12/27. [employer] will need copy of office note also.In your yellow folder. Please advise

## 2019-02-21 NOTE — Telephone Encounter (Signed)
Please close patient wanted appointment instead.

## 2019-02-22 ENCOUNTER — Encounter: Payer: Self-pay | Admitting: Family Medicine

## 2019-02-27 ENCOUNTER — Ambulatory Visit (HOSPITAL_COMMUNITY)
Admission: RE | Admit: 2019-02-27 | Discharge: 2019-02-27 | Disposition: A | Discharge status: Home / Self Care | Payer: 59 | Source: Ambulatory Visit | Attending: Family Medicine | Admitting: Family Medicine

## 2019-02-27 ENCOUNTER — Other Ambulatory Visit: Payer: Self-pay

## 2019-02-27 DIAGNOSIS — R0602 Shortness of breath: Secondary | ICD-10-CM | POA: Insufficient documentation

## 2019-02-27 DIAGNOSIS — R05 Cough: Secondary | ICD-10-CM | POA: Diagnosis present

## 2019-03-29 ENCOUNTER — Other Ambulatory Visit: Payer: Self-pay | Admitting: Family Medicine

## 2019-03-29 ENCOUNTER — Emergency Department (HOSPITAL_COMMUNITY)
Admission: EM | Admit: 2019-03-29 | Discharge: 2019-03-29 | Disposition: A | Discharge status: Home / Self Care | Payer: 59 | Attending: Emergency Medicine | Admitting: Emergency Medicine

## 2019-03-29 ENCOUNTER — Encounter (HOSPITAL_COMMUNITY): Payer: Self-pay | Admitting: Emergency Medicine

## 2019-03-29 ENCOUNTER — Ambulatory Visit
Admission: EM | Admit: 2019-03-29 | Discharge: 2019-03-29 | Disposition: A | Discharge status: Home / Self Care | Payer: 59 | Source: Home / Self Care

## 2019-03-29 ENCOUNTER — Emergency Department (HOSPITAL_COMMUNITY): Payer: 59

## 2019-03-29 ENCOUNTER — Other Ambulatory Visit: Payer: Self-pay

## 2019-03-29 ENCOUNTER — Telehealth: Payer: Self-pay | Admitting: Family Medicine

## 2019-03-29 DIAGNOSIS — Z79899 Other long term (current) drug therapy: Secondary | ICD-10-CM | POA: Diagnosis not present

## 2019-03-29 DIAGNOSIS — Z7901 Long term (current) use of anticoagulants: Secondary | ICD-10-CM | POA: Insufficient documentation

## 2019-03-29 DIAGNOSIS — Y93H2 Activity, gardening and landscaping: Secondary | ICD-10-CM | POA: Diagnosis not present

## 2019-03-29 DIAGNOSIS — S0101XA Laceration without foreign body of scalp, initial encounter: Secondary | ICD-10-CM | POA: Insufficient documentation

## 2019-03-29 DIAGNOSIS — E785 Hyperlipidemia, unspecified: Secondary | ICD-10-CM

## 2019-03-29 DIAGNOSIS — Z125 Encounter for screening for malignant neoplasm of prostate: Secondary | ICD-10-CM

## 2019-03-29 DIAGNOSIS — W208XXA Other cause of strike by thrown, projected or falling object, initial encounter: Secondary | ICD-10-CM | POA: Diagnosis not present

## 2019-03-29 DIAGNOSIS — S0990XA Unspecified injury of head, initial encounter: Secondary | ICD-10-CM | POA: Diagnosis present

## 2019-03-29 DIAGNOSIS — Y929 Unspecified place or not applicable: Secondary | ICD-10-CM | POA: Insufficient documentation

## 2019-03-29 DIAGNOSIS — Y999 Unspecified external cause status: Secondary | ICD-10-CM | POA: Diagnosis not present

## 2019-03-29 DIAGNOSIS — I10 Essential (primary) hypertension: Secondary | ICD-10-CM | POA: Insufficient documentation

## 2019-03-29 DIAGNOSIS — Z23 Encounter for immunization: Secondary | ICD-10-CM | POA: Diagnosis not present

## 2019-03-29 MED ORDER — ACETAMINOPHEN 325 MG PO TABS
650.0000 mg | ORAL_TABLET | Freq: Once | ORAL | Status: AC
  Administered 2019-03-29: 650 mg via ORAL
  Filled 2019-03-29: qty 2

## 2019-03-29 MED ORDER — TETANUS-DIPHTH-ACELL PERTUSSIS 5-2.5-18.5 LF-MCG/0.5 IM SUSP
0.5000 mL | Freq: Once | INTRAMUSCULAR | Status: AC
  Administered 2019-03-29: 0.5 mL via INTRAMUSCULAR
  Filled 2019-03-29: qty 0.5

## 2019-03-29 NOTE — ED Triage Notes (Signed)
Pt presents to Urgent Care. States a tree limb fell on his head while he was cutting a tree down. Patient is unsure how big the limb/branch was. Pt has a laceration on the top of his head, bleeding is controlled at this time. Pt denies any loc. Pt takes Eliquis at home. No neuro deficits present during intake. Per [place] the patient is being sent to the ER for further imaging

## 2019-03-29 NOTE — ED Triage Notes (Signed)
Tree branch fell on pt's head while he was cutting a tree down today. Denies loc. Lac to top of head with no active bleeding.  Pt takes eliquis.

## 2019-03-29 NOTE — Telephone Encounter (Signed)
Please schedule and then send back

## 2019-03-29 NOTE — Telephone Encounter (Signed)
Pt returned call and verbalized understanding  

## 2019-03-29 NOTE — Telephone Encounter (Signed)
Ok times one, call pt rec chronic visit or wellness in april
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
## 2019-03-29 NOTE — Telephone Encounter (Signed)
Lab orders placed. Left message to return call  

## 2019-03-29 NOTE — Telephone Encounter (Signed)
Patient has physical schedule for 4/13

## 2019-03-29 NOTE — Telephone Encounter (Signed)
Same plus psa

## 2019-03-29 NOTE — Telephone Encounter (Signed)
Requesting 90 day supply. Last visit July 2020. No upcoming appt scheduled

## 2019-03-29 NOTE — Telephone Encounter (Signed)
Patient has physical on 4/13 and needing labs done

## 2019-03-29 NOTE — Telephone Encounter (Signed)
Last labs completed on 10/11/2017 Uric Acid, BMET, Hepatic and lipid. Please advise. Thank you

## 2019-03-29 NOTE — Telephone Encounter (Signed)
lvm to schedule physical

## 2019-04-18 ENCOUNTER — Telehealth: Payer: Self-pay | Admitting: Family Medicine

## 2019-04-18 DIAGNOSIS — R5383 Other fatigue: Secondary | ICD-10-CM

## 2019-04-18 DIAGNOSIS — I10 Essential (primary) hypertension: Secondary | ICD-10-CM

## 2019-04-18 DIAGNOSIS — M10062 Idiopathic gout, left knee: Secondary | ICD-10-CM

## 2019-04-18 DIAGNOSIS — E785 Hyperlipidemia, unspecified: Secondary | ICD-10-CM

## 2019-04-18 DIAGNOSIS — Z79899 Other long term (current) drug therapy: Secondary | ICD-10-CM

## 2019-04-18 DIAGNOSIS — Z125 Encounter for screening for malignant neoplasm of prostate: Secondary | ICD-10-CM

## 2019-04-18 DIAGNOSIS — E7800 Pure hypercholesterolemia, unspecified: Secondary | ICD-10-CM

## 2019-04-18 NOTE — Telephone Encounter (Signed)
Patient would like his testosterone levels check also with current lab orders has physical on 4/13

## 2019-04-20 NOTE — Telephone Encounter (Signed)
Testosterone order added.

## 2019-04-20 NOTE — Telephone Encounter (Signed)
Ok lets add

## 2019-04-22 ENCOUNTER — Other Ambulatory Visit: Payer: Self-pay | Admitting: Family Medicine

## 2019-04-23 ENCOUNTER — Other Ambulatory Visit: Payer: Self-pay | Admitting: *Deleted

## 2019-04-23 NOTE — Telephone Encounter (Signed)
6 mo worth  

## 2019-04-24 LAB — HEPATIC FUNCTION PANEL
ALT: 17 IU/L (ref 0–44)
AST: 17 IU/L (ref 0–40)
Albumin: 4.2 g/dL (ref 3.8–4.8)
Alkaline Phosphatase: 67 IU/L (ref 39–117)
Bilirubin Total: 0.3 mg/dL (ref 0.0–1.2)
Bilirubin, Direct: 0.11 mg/dL (ref 0.00–0.40)
Total Protein: 5.9 g/dL — ABNORMAL LOW (ref 6.0–8.5)

## 2019-04-24 LAB — BASIC METABOLIC PANEL
BUN/Creatinine Ratio: 10 (ref 10–24)
BUN: 13 mg/dL (ref 8–27)
CO2: 24 mmol/L (ref 20–29)
Calcium: 9 mg/dL (ref 8.6–10.2)
Chloride: 107 mmol/L — ABNORMAL HIGH (ref 96–106)
Creatinine, Ser: 1.29 mg/dL — ABNORMAL HIGH (ref 0.76–1.27)
GFR calc Af Amer: 68 mL/min/{1.73_m2} (ref >59)
GFR calc non Af Amer: 59 mL/min/{1.73_m2} — ABNORMAL LOW (ref >59)
Glucose: 102 mg/dL — ABNORMAL HIGH (ref 65–99)
Potassium: 4.4 mmol/L (ref 3.5–5.2)
Sodium: 143 mmol/L (ref 134–144)

## 2019-04-24 LAB — LIPID PANEL
Chol/HDL Ratio: 5.2 ratio — ABNORMAL HIGH (ref 0.0–5.0)
Cholesterol, Total: 177 mg/dL (ref 100–199)
HDL: 34 mg/dL — ABNORMAL LOW (ref >39)
LDL Chol Calc (NIH): 126 mg/dL — ABNORMAL HIGH (ref 0–99)
Triglycerides: 89 mg/dL (ref 0–149)
VLDL Cholesterol Cal: 17 mg/dL (ref 5–40)

## 2019-04-24 LAB — TESTOSTERONE: Testosterone: 442 ng/dL (ref 264–916)

## 2019-04-24 LAB — URIC ACID: Uric Acid: 6.1 mg/dL (ref 3.8–8.4)

## 2019-04-24 LAB — PSA: Prostate Specific Ag, Serum: 0.9 ng/mL (ref 0.0–4.0)

## 2019-05-01 ENCOUNTER — Encounter: Payer: Self-pay | Admitting: Family Medicine

## 2019-05-01 ENCOUNTER — Ambulatory Visit (INDEPENDENT_AMBULATORY_CARE_PROVIDER_SITE_OTHER): Payer: 59 | Admitting: Family Medicine

## 2019-05-01 ENCOUNTER — Other Ambulatory Visit: Payer: Self-pay

## 2019-05-01 VITALS — BP 140/84 | Temp 97.3°F | Ht 71.0 in | Wt 200.6 lb

## 2019-05-01 DIAGNOSIS — G4733 Obstructive sleep apnea (adult) (pediatric): Secondary | ICD-10-CM | POA: Diagnosis not present

## 2019-05-01 DIAGNOSIS — Z9989 Dependence on other enabling machines and devices: Secondary | ICD-10-CM

## 2019-05-01 DIAGNOSIS — E785 Hyperlipidemia, unspecified: Secondary | ICD-10-CM

## 2019-05-01 DIAGNOSIS — I10 Essential (primary) hypertension: Secondary | ICD-10-CM | POA: Diagnosis not present

## 2019-05-01 DIAGNOSIS — Z1211 Encounter for screening for malignant neoplasm of colon: Secondary | ICD-10-CM

## 2019-05-01 DIAGNOSIS — E7800 Pure hypercholesterolemia, unspecified: Secondary | ICD-10-CM

## 2019-05-01 DIAGNOSIS — Z0000 Encounter for general adult medical examination without abnormal findings: Secondary | ICD-10-CM

## 2019-05-01 DIAGNOSIS — M10062 Idiopathic gout, left knee: Secondary | ICD-10-CM

## 2019-05-01 MED ORDER — ALBUTEROL SULFATE HFA 108 (90 BASE) MCG/ACT IN AERS: 2.0000 | INHALATION_SPRAY | Freq: Four times a day (QID) | RESPIRATORY_TRACT | 5 refills | Status: DC | PRN

## 2019-05-01 MED ORDER — HYDROCHLOROTHIAZIDE 25 MG PO TABS: 25.0000 mg | ORAL_TABLET | Freq: Every day | ORAL | 5 refills | Status: DC

## 2019-05-01 MED ORDER — PANTOPRAZOLE SODIUM 40 MG PO TBEC: 40.0000 mg | DELAYED_RELEASE_TABLET | Freq: Every day | ORAL | 5 refills | Status: DC

## 2019-05-01 MED ORDER — ALLOPURINOL 100 MG PO TABS: 200.0000 mg | ORAL_TABLET | Freq: Every day | ORAL | 5 refills | Status: DC

## 2019-05-01 MED ORDER — COLCHICINE 0.6 MG PO TABS: 0.6000 mg | ORAL_TABLET | ORAL | 5 refills | Status: DC

## 2019-05-01 MED ORDER — ESCITALOPRAM OXALATE 10 MG PO TABS: 10.0000 mg | ORAL_TABLET | Freq: Every day | ORAL | 5 refills | Status: DC

## 2019-05-01 MED ORDER — LISINOPRIL 20 MG PO TABS: 20.0000 mg | ORAL_TABLET | Freq: Two times a day (BID) | ORAL | 5 refills | Status: DC

## 2019-05-01 MED ORDER — VERAPAMIL HCL ER 240 MG PO CP24: 240.0000 mg | ORAL_CAPSULE | Freq: Every day | ORAL | 1 refills | Status: DC
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
## 2019-05-02 ENCOUNTER — Encounter (HOSPITAL_COMMUNITY): Payer: Self-pay | Admitting: Nurse Practitioner

## 2019-05-02 ENCOUNTER — Ambulatory Visit (HOSPITAL_COMMUNITY)
Admission: RE | Admit: 2019-05-02 | Discharge: 2019-05-02 | Disposition: A | Discharge status: Home / Self Care | Payer: 59 | Source: Ambulatory Visit | Attending: Nurse Practitioner | Admitting: Nurse Practitioner

## 2019-05-02 VITALS — BP 184/96 | HR 55 | Ht 71.0 in | Wt 200.4 lb

## 2019-05-02 DIAGNOSIS — Z8249 Family history of ischemic heart disease and other diseases of the circulatory system: Secondary | ICD-10-CM | POA: Diagnosis not present

## 2019-05-02 DIAGNOSIS — I4819 Other persistent atrial fibrillation: Secondary | ICD-10-CM | POA: Insufficient documentation

## 2019-05-02 DIAGNOSIS — Z888 Allergy status to other drugs, medicaments and biological substances status: Secondary | ICD-10-CM | POA: Diagnosis not present

## 2019-05-02 DIAGNOSIS — D6869 Other thrombophilia: Secondary | ICD-10-CM

## 2019-05-02 DIAGNOSIS — G4733 Obstructive sleep apnea (adult) (pediatric): Secondary | ICD-10-CM | POA: Insufficient documentation

## 2019-05-02 DIAGNOSIS — Z8349 Family history of other endocrine, nutritional and metabolic diseases: Secondary | ICD-10-CM | POA: Diagnosis not present

## 2019-05-02 DIAGNOSIS — I4891 Unspecified atrial fibrillation: Secondary | ICD-10-CM | POA: Diagnosis present

## 2019-05-02 DIAGNOSIS — Z8616 Personal history of COVID-19: Secondary | ICD-10-CM | POA: Insufficient documentation

## 2019-05-02 DIAGNOSIS — Z79899 Other long term (current) drug therapy: Secondary | ICD-10-CM | POA: Diagnosis not present

## 2019-05-02 DIAGNOSIS — E785 Hyperlipidemia, unspecified: Secondary | ICD-10-CM | POA: Diagnosis not present

## 2019-05-02 DIAGNOSIS — F419 Anxiety disorder, unspecified: Secondary | ICD-10-CM | POA: Insufficient documentation

## 2019-05-02 DIAGNOSIS — Z87442 Personal history of urinary calculi: Secondary | ICD-10-CM | POA: Insufficient documentation

## 2019-05-02 DIAGNOSIS — G8929 Other chronic pain: Secondary | ICD-10-CM | POA: Diagnosis not present

## 2019-05-02 DIAGNOSIS — Z886 Allergy status to analgesic agent status: Secondary | ICD-10-CM | POA: Insufficient documentation

## 2019-05-02 DIAGNOSIS — Z7901 Long term (current) use of anticoagulants: Secondary | ICD-10-CM | POA: Diagnosis not present

## 2019-05-02 DIAGNOSIS — Z91041 Radiographic dye allergy status: Secondary | ICD-10-CM | POA: Insufficient documentation

## 2019-05-02 DIAGNOSIS — M109 Gout, unspecified: Secondary | ICD-10-CM | POA: Insufficient documentation

## 2019-05-02 DIAGNOSIS — I10 Essential (primary) hypertension: Secondary | ICD-10-CM | POA: Insufficient documentation

## 2019-05-02 DIAGNOSIS — K219 Gastro-esophageal reflux disease without esophagitis: Secondary | ICD-10-CM | POA: Diagnosis not present

## 2019-05-02 DIAGNOSIS — M549 Dorsalgia, unspecified: Secondary | ICD-10-CM | POA: Insufficient documentation
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
## 2019-05-09 ENCOUNTER — Encounter: Payer: Self-pay | Admitting: Family Medicine

## 2019-05-09 ENCOUNTER — Encounter (INDEPENDENT_AMBULATORY_CARE_PROVIDER_SITE_OTHER): Payer: Self-pay | Admitting: *Deleted

## 2019-05-14 ENCOUNTER — Other Ambulatory Visit: Payer: Self-pay | Admitting: *Deleted

## 2019-05-14 MED ORDER — PANTOPRAZOLE SODIUM 40 MG PO TBEC: 40.0000 mg | DELAYED_RELEASE_TABLET | Freq: Every day | ORAL | 1 refills | Status: DC

## 2019-06-27 ENCOUNTER — Other Ambulatory Visit: Payer: Self-pay | Admitting: Internal Medicine

## 2019-06-27 ENCOUNTER — Other Ambulatory Visit: Payer: Self-pay | Admitting: Family Medicine

## 2019-07-05 ENCOUNTER — Other Ambulatory Visit: Payer: Self-pay | Admitting: Family Medicine

## 2019-07-05 DIAGNOSIS — M10062 Idiopathic gout, left knee: Secondary | ICD-10-CM

## 2019-07-05 NOTE — Telephone Encounter (Signed)
PT stated that insurance [facility] wants to do meds by mail zolpidem (AMBIEN) 10 MG tablet ,verapamil (VERELAN PM) 240 MG 24 hr capsule ,allopurinol (ZYLOPRIM) 100 MG tablet, lisinopril (ZESTRIL) 20 MG table

## 2019-07-05 NOTE — Telephone Encounter (Signed)
Also wanted albuterol inhaler to [facility]

## 2019-07-05 NOTE — Telephone Encounter (Signed)
Ok, please set up the scripts.  Thx. Dr. Lovena Le

## 2019-07-05 NOTE — Telephone Encounter (Signed)
Wellness 05/01/19

## 2019-07-06 ENCOUNTER — Telehealth: Payer: Self-pay | Admitting: Family Medicine

## 2019-07-06 MED ORDER — VERAPAMIL HCL ER 240 MG PO CP24: 240.0000 mg | ORAL_CAPSULE | Freq: Every day | ORAL | 0 refills | Status: DC

## 2019-07-06 MED ORDER — LISINOPRIL 20 MG PO TABS: 20.0000 mg | ORAL_TABLET | Freq: Two times a day (BID) | ORAL | 0 refills | Status: DC

## 2019-07-06 MED ORDER — ALLOPURINOL 100 MG PO TABS: 200.0000 mg | ORAL_TABLET | Freq: Every day | ORAL | 0 refills | Status: DC

## 2019-07-06 MED ORDER — ALBUTEROL SULFATE HFA 108 (90 BASE) MCG/ACT IN AERS: 2.0000 | INHALATION_SPRAY | Freq: Four times a day (QID) | RESPIRATORY_TRACT | 2 refills | Status: DC | PRN

## 2019-07-06 MED ORDER — ZOLPIDEM TARTRATE 10 MG PO TABS: ORAL_TABLET | ORAL | 2 refills | Status: DC

## 2019-07-06 NOTE — Telephone Encounter (Signed)
Meds pended and ready to sign  

## 2019-07-06 NOTE — Telephone Encounter (Signed)
Form from [facility]. Plain Verelan is available as 240 mg capsule. Verelan PM is available as 100 mg, 200 mg or 300 mg capsule. Refill sent in today. Please advise. Thank you

## 2019-07-06 NOTE — Addendum Note (Signed)
Addended by: Carmelina Noun on: 07/06/2019 01:54 PM   Modules accepted: Orders

## 2019-07-07 NOTE — Telephone Encounter (Signed)
Hi,   Please verify with the pharmacy the concern, the pt has been on verapamil 240xr since 2015 per the chart.  I just reordered what he was already on.   Let me know what the issue is with the medication.   Thanks,   Dr. Darene Lamer

## 2019-07-09 ENCOUNTER — Other Ambulatory Visit: Payer: Self-pay | Admitting: *Deleted

## 2019-07-09 MED ORDER — VERAPAMIL HCL ER 240 MG PO TBCR: EXTENDED_RELEASE_TABLET | ORAL | 0 refills | Status: DC

## 2019-07-09 NOTE — Telephone Encounter (Signed)
Called pharm and was told that formulation is not available they do have a 240 sr capsule available. Do you want to switch?

## 2019-07-09 NOTE — Telephone Encounter (Signed)
Yes, that is fine. Thx. Dr. Lovena Le

## 2019-07-09 NOTE — Telephone Encounter (Signed)
Please call pharmacy to verify issue. Thank you

## 2019-07-09 NOTE — Telephone Encounter (Signed)
Med sent to pharm 

## 2019-07-18 ENCOUNTER — Other Ambulatory Visit (INDEPENDENT_AMBULATORY_CARE_PROVIDER_SITE_OTHER): Payer: Self-pay | Admitting: *Deleted

## 2019-07-18 DIAGNOSIS — Z1211 Encounter for screening for malignant neoplasm of colon: Secondary | ICD-10-CM

## 2019-07-19 ENCOUNTER — Encounter (INDEPENDENT_AMBULATORY_CARE_PROVIDER_SITE_OTHER): Payer: Self-pay | Admitting: *Deleted

## 2019-07-19 ENCOUNTER — Telehealth (INDEPENDENT_AMBULATORY_CARE_PROVIDER_SITE_OTHER): Payer: Self-pay | Admitting: *Deleted

## 2019-07-19 NOTE — Telephone Encounter (Signed)
Patient needs [place] (copay card) ° °

## 2019-07-20 ENCOUNTER — Telehealth (INDEPENDENT_AMBULATORY_CARE_PROVIDER_SITE_OTHER): Payer: Self-pay | Admitting: *Deleted

## 2019-07-20 ENCOUNTER — Encounter (INDEPENDENT_AMBULATORY_CARE_PROVIDER_SITE_OTHER): Payer: Self-pay | Admitting: *Deleted

## 2019-07-20 ENCOUNTER — Telehealth: Payer: Self-pay | Admitting: *Deleted

## 2019-07-20 MED ORDER — SUTAB 1479-225-188 MG PO TABS
1.0000 | ORAL_TABLET | Freq: Once | ORAL | 0 refills | Status: AC
Start: 2019-07-20 — End: 2019-07-20

## 2019-07-20 NOTE — Telephone Encounter (Signed)
Referring MD/PCP: steve luking   Procedure: tcs  Reason/Indication:  screening  Has patient had this procedure before?  Yes, over 10 yrs ago  If so, when, by whom and where?    Is there a family history of colon cancer?  no  Who?  What age when diagnosed?    Is patient diabetic?   no      Does patient have prosthetic heart valve or mechanical valve?  no  Do you have a pacemaker/defibrillator?  no  Has patient ever had endocarditis/atrial fibrillation? no  Does patient use oxygen? no  Has patient had joint replacement within last 12 months?  no  Is patient constipated or do they take laxatives? no  Does patient have a history of alcohol/drug use?  no  Is patient on blood thinner such as Coumadin, Plavix and/or Aspirin? yes  Medications: eliquis 5 mg bid, pantoprazole 40 mg daily, albuterol, allopurinol 100 mg daily, colchicine 0.6 mg daily, escitalproam 10 mg daily, hctz 25 mg daily, lisinopril 20 mg daily, verapamil 240 mg daily, zolpidem 10 mg daily, ezetimibe 10 mg daily, simvastatin 50 mg daily, metoprolol 25 mg daily, vit b12 daily, epinepherine daily, loratadine 10 mg daily, mvi daily  05/19/19 -- NO NEED FOR PROPOFOL PER DR REHMAN  Allergies: nkda  Medication Adjustment per Dr Rehman/Dr Jenetta Downer eliquis 2 days  Procedure date & time: 08/30/19

## 2019-07-20 NOTE — Telephone Encounter (Signed)
   Primary Cardiologist: Thompson Grayer, MD  Chart reviewed as part of pre-operative protocol coverage. Given past medical history and time since last visit, based on ACC/AHA guidelines, Anthony [name] would be at acceptable risk for the planned procedure without further cardiovascular testing.   Patient with diagnosis of afib on Eliquis for anticoagulation.    Procedure: COLONOSCOPY Date of procedure: 08/30/19  CHADS2-VASc score of  1 (HTN)  CrCl 62 ml/min  Per office protocol, patient can hold Eliquis for 2 days prior to procedure.  I will route this recommendation to the requesting party via Epic fax function and remove from pre-op pool.  Please call with questions.  Jossie Ng. Nayla Dias NP-C    07/20/2019, 4:24 PM [facility] [place] [address] Office [phone] Fax [phone]

## 2019-07-20 NOTE — Telephone Encounter (Signed)
   [facility] Pre-operative Risk Assessment    HEARTCARE STAFF: - Please ensure there is not already an duplicate clearance open for this procedure. - Under Visit Info/Reason for Call, type in Other and utilize the format Clearance MM/DD/YY or Clearance TBD. Do not use dashes or single digits. - If request is for dental extraction, please clarify the # of teeth to be extracted.  Request for surgical clearance:  1. What type of surgery is being performed? COLONOSCOPY   2. When is this surgery scheduled? 08/30/19   3. What type of clearance is required (medical clearance vs. Pharmacy clearance to hold med vs. Both)? BOTH  4. Are there any medications that need to be held prior to surgery and how long? ELIQUIS X 2 DAYS PRIOR TO PROCEDURE   5. Practice name and name of physician performing surgery? [facility]; DR. Laural Golden   6. What is the office phone number? [phone]   7.   What is the office fax number? [phone]  8.   Anesthesia type (None, local, MAC, general) ? MODERATE   Anthony [name] 07/20/2019, 12:15 PM  _________________________________________________________________   (provider comments below)

## 2019-07-20 NOTE — Telephone Encounter (Signed)
Patient with diagnosis of afib on Eliquis for anticoagulation.    Procedure: COLONOSCOPY Date of procedure: 08/30/19  CHADS2-VASc score of  1 (HTN)  CrCl 62 ml/min  Per office protocol, patient can hold Eliquis for 2 days prior to procedure.

## 2019-08-02 ENCOUNTER — Other Ambulatory Visit: Payer: Self-pay | Admitting: Family Medicine

## 2019-08-02 DIAGNOSIS — M10062 Idiopathic gout, left knee: Secondary | ICD-10-CM

## 2019-08-03 NOTE — Telephone Encounter (Signed)
Scheduled 8/19

## 2019-08-03 NOTE — Telephone Encounter (Signed)
Has upcoming appt 8/19. Last visit 05/01/19 wellness exam. Last labs 05/03/19 testosterone, uric acid, psa, bmp, liver, lipid

## 2019-08-06 ENCOUNTER — Other Ambulatory Visit: Payer: Self-pay | Admitting: *Deleted

## 2019-08-06 MED ORDER — PANTOPRAZOLE SODIUM 40 MG PO TBEC: 40.0000 mg | DELAYED_RELEASE_TABLET | Freq: Every day | ORAL | 0 refills | Status: DC

## 2019-08-08 ENCOUNTER — Other Ambulatory Visit: Payer: Self-pay | Admitting: *Deleted

## 2019-08-08 MED ORDER — PANTOPRAZOLE SODIUM 40 MG PO TBEC: 40.0000 mg | DELAYED_RELEASE_TABLET | Freq: Every day | ORAL | 0 refills | Status: DC

## 2019-08-22 ENCOUNTER — Other Ambulatory Visit (INDEPENDENT_AMBULATORY_CARE_PROVIDER_SITE_OTHER): Payer: Self-pay | Admitting: *Deleted

## 2019-08-28 ENCOUNTER — Other Ambulatory Visit: Payer: Self-pay | Admitting: Family Medicine

## 2019-08-28 ENCOUNTER — Other Ambulatory Visit: Payer: Self-pay

## 2019-08-28 ENCOUNTER — Other Ambulatory Visit (HOSPITAL_COMMUNITY)
Admission: RE | Admit: 2019-08-28 | Discharge: 2019-08-28 | Disposition: A | Discharge status: Home / Self Care | Payer: 59 | Source: Ambulatory Visit | Attending: Internal Medicine | Admitting: Internal Medicine

## 2019-08-28 DIAGNOSIS — Z20822 Contact with and (suspected) exposure to covid-19: Secondary | ICD-10-CM | POA: Diagnosis not present

## 2019-08-28 DIAGNOSIS — M10062 Idiopathic gout, left knee: Secondary | ICD-10-CM

## 2019-08-28 DIAGNOSIS — Z01812 Encounter for preprocedural laboratory examination: Secondary | ICD-10-CM | POA: Insufficient documentation

## 2019-08-29 LAB — SARS CORONAVIRUS 2 (TAT 6-24 HRS): SARS Coronavirus 2: NEGATIVE

## 2019-08-30 ENCOUNTER — Encounter (HOSPITAL_COMMUNITY): Payer: Self-pay | Admitting: Anesthesiology

## 2019-08-30 ENCOUNTER — Ambulatory Visit (HOSPITAL_COMMUNITY)
Admission: RE | Admit: 2019-08-30 | Discharge: 2019-08-30 | Disposition: A | Discharge status: Home / Self Care | Payer: 59 | Attending: Internal Medicine | Admitting: Internal Medicine

## 2019-08-30 ENCOUNTER — Encounter (HOSPITAL_COMMUNITY): Payer: Self-pay | Admitting: Internal Medicine

## 2019-08-30 ENCOUNTER — Encounter (HOSPITAL_COMMUNITY)
Admission: RE | Disposition: A | Discharge status: Home / Self Care | Payer: Self-pay | Source: Home / Self Care | Attending: Internal Medicine

## 2019-08-30 ENCOUNTER — Other Ambulatory Visit: Payer: Self-pay

## 2019-08-30 DIAGNOSIS — I10 Essential (primary) hypertension: Secondary | ICD-10-CM | POA: Diagnosis not present

## 2019-08-30 DIAGNOSIS — M109 Gout, unspecified: Secondary | ICD-10-CM | POA: Insufficient documentation

## 2019-08-30 DIAGNOSIS — F419 Anxiety disorder, unspecified: Secondary | ICD-10-CM | POA: Diagnosis not present

## 2019-08-30 DIAGNOSIS — G4733 Obstructive sleep apnea (adult) (pediatric): Secondary | ICD-10-CM | POA: Diagnosis not present

## 2019-08-30 DIAGNOSIS — K648 Other hemorrhoids: Secondary | ICD-10-CM | POA: Insufficient documentation

## 2019-08-30 DIAGNOSIS — Z79899 Other long term (current) drug therapy: Secondary | ICD-10-CM | POA: Diagnosis not present

## 2019-08-30 DIAGNOSIS — Z7901 Long term (current) use of anticoagulants: Secondary | ICD-10-CM | POA: Insufficient documentation

## 2019-08-30 DIAGNOSIS — G4700 Insomnia, unspecified: Secondary | ICD-10-CM | POA: Insufficient documentation

## 2019-08-30 DIAGNOSIS — G43909 Migraine, unspecified, not intractable, without status migrainosus: Secondary | ICD-10-CM | POA: Insufficient documentation

## 2019-08-30 DIAGNOSIS — E785 Hyperlipidemia, unspecified: Secondary | ICD-10-CM | POA: Diagnosis not present

## 2019-08-30 DIAGNOSIS — K219 Gastro-esophageal reflux disease without esophagitis: Secondary | ICD-10-CM | POA: Diagnosis not present

## 2019-08-30 DIAGNOSIS — Z1211 Encounter for screening for malignant neoplasm of colon: Secondary | ICD-10-CM | POA: Diagnosis present

## 2019-08-30 HISTORY — PX: COLONOSCOPY: SHX5424

## 2019-08-30 SURGERY — COLONOSCOPY
Anesthesia: Moderate Sedation

## 2019-08-30 MED ORDER — MEPERIDINE HCL 50 MG/ML IJ SOLN
INTRAMUSCULAR | Status: AC
  Filled 2019-08-30: qty 1

## 2019-08-30 MED ORDER — MEPERIDINE HCL 50 MG/ML IJ SOLN
INTRAMUSCULAR | Status: DC | PRN
  Administered 2019-08-30 (×2): 25 mg

## 2019-08-30 MED ORDER — STERILE WATER FOR IRRIGATION IR SOLN
Status: DC | PRN
  Administered 2019-08-30: 5 mL

## 2019-08-30 MED ORDER — SODIUM CHLORIDE 0.9 % IV SOLN: INTRAVENOUS | Status: DC

## 2019-08-30 MED ORDER — MIDAZOLAM HCL 5 MG/5ML IJ SOLN
INTRAMUSCULAR | Status: AC
  Filled 2019-08-30: qty 10

## 2019-08-30 MED ORDER — MIDAZOLAM HCL 5 MG/5ML IJ SOLN
INTRAMUSCULAR | Status: DC | PRN
  Administered 2019-08-30 (×3): 2 mg via INTRAVENOUS
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
## 2019-08-30 NOTE — Discharge Instructions (Signed)
Resume usual medications including Eliquis/apixaban as before. Resume usual diet. No driving for 24 hours. Next screening exam in 10 years.   Colonoscopy, Adult, Care After This sheet gives you information about how to care for yourself after your procedure. Your health care provider may also give you more specific instructions. If you have problems or questions, contact your health care provider.  Dr. Laural Golden:  [phone] What can I expect after the procedure? After the procedure, it is common to have:  A small amount of blood in your stool for 24 hours after the procedure.  Some gas.  Mild cramping or bloating of your abdomen. Follow these instructions at home: Eating and drinking   Drink enough fluid to keep your urine pale yellow.    Resume your normal diet. Activity  Rest as told by your health care provider.  Avoid sitting for a long time without moving. Get up to take short walks every 1-2 hours. This is important to improve blood flow and breathing. Ask for help if you feel weak or unsteady.  Managing cramping and bloating   Try walking around when you have cramps or feel bloated.  General instructions  For the first 24 hours after the procedure: ? Do not drive or use machinery. ? Do not sign important documents. ? Do not drink alcohol. ? Do your regular daily activities at a slower pace than normal. ? Eat soft foods that are easy to digest.  Take over-the-counter and prescription medicines only as told by your health care provider.  Keep all follow-up visits as told by your health care provider. This is important. Contact a health care provider if:  You have blood in your stool 2-3 days after the procedure. Get help right away if you have:  More than a small spotting of blood in your stool.  Large blood clots in your stool.  Swelling of your abdomen.  Nausea or vomiting.  A fever.  Increasing pain in your abdomen that is not relieved with  medicine. Summary  After the procedure, it is common to have a small amount of blood in your stool. You may also have mild cramping and bloating of your abdomen.  For the first 24 hours after the procedure, do not drive or use machinery, sign important documents, or drink alcohol.  Get help right away if you have a lot of blood in your stool, nausea or vomiting, a fever, or increased pain in your abdomen. This information is not intended to replace advice given to you by your health care provider. Make sure you discuss any questions you have with your health care provider. Document Revised: 07/31/2018 Document Reviewed: 07/31/2018 Elsevier Patient Education  [place].

## 2019-08-30 NOTE — Op Note (Signed)
[facility] Patient Name: Anthony [name] Procedure Date: 08/30/2019 7:56 AM MRN: [id number] Date of Birth: 1955/04/08 Attending MD: Hildred Laser , MD CSN: [id number] Age: 64 Admit Type: Outpatient Procedure:                Colonoscopy Indications:              Screening for colorectal malignant neoplasm Providers:                Hildred Laser, MD, Janeece Riggers, RN, Randa Spike,                            Technician Referring MD:             Elvia Collum, DO Medicines:                Meperidine 50 mg IV, Midazolam 6 mg IV Complications:            No immediate complications. Estimated Blood Loss:     Estimated blood loss: none. Procedure:                Pre-Anesthesia Assessment:                           - Prior to the procedure, a History and Physical                            was performed, and patient medications and                            allergies were reviewed. The patient's tolerance of                            previous anesthesia was also reviewed. The risks                            and benefits of the procedure and the sedation                            options and risks were discussed with the patient.                            All questions were answered, and informed consent                            was obtained. Prior Anticoagulants: The patient                            last took Eliquis (apixaban) 2 days prior to the                            procedure. ASA Grade Assessment: II - A patient                            with mild systemic disease. After reviewing the  risks and benefits, the patient was deemed in                            satisfactory condition to undergo the procedure.                           After obtaining informed consent, the colonoscope                            was passed under direct vision. Throughout the                            procedure, the patient's blood pressure, pulse, and                             oxygen saturations were monitored continuously. The                            [id number]-[id number] ([id number]) scope was introduced through                            the anus and advanced to the the cecum, identified                            by appendiceal orifice and ileocecal valve. The                            colonoscopy was performed without difficulty. The                            patient tolerated the procedure well. The quality                            of the bowel preparation was good. The ileocecal                            valve, appendiceal orifice, and rectum were                            photographed. Scope In: 8:23:06 AM Scope Out: 8:35:35 AM Scope Withdrawal Time: 0 hours 8 minutes 45 seconds  Total Procedure Duration: 0 hours 12 minutes 29 seconds  Findings:      The perianal and digital rectal examinations were normal.      The colon (entire examined portion) appeared normal.      Internal hemorrhoids were found during retroflexion. The hemorrhoids       were small. Impression:               - The entire examined colon is normal.                           - Internal hemorrhoids.                           - No specimens collected. Moderate Sedation:  Moderate (conscious) sedation was administered by the endoscopy nurse       and supervised by the endoscopist. The following parameters were       monitored: oxygen saturation, heart rate, blood pressure, CO2       capnography and response to care. Total physician intraservice time was       17 minutes. Recommendation:           - Patient has a contact number available for                            emergencies. The signs and symptoms of potential                            delayed complications were discussed with the                            patient. Return to normal activities tomorrow.                            Written discharge instructions were provided to the                             patient.                           - Resume previous diet today.                           - Continue present medications.                           - Resume Eliquis (apixaban) at prior dose today.                           - Repeat colonoscopy in 10 years for screening                            purposes. Procedure Code(s):        --- Professional ---                           [id number], Colonoscopy, flexible; diagnostic, including                            collection of specimen(s) by brushing or washing,                            when performed (separate procedure)                           G0500, Moderate sedation services provided by the                            same physician or other qualified health care  professional performing a gastrointestinal                            endoscopic service that sedation supports,                            requiring the presence of an independent trained                            observer to assist in the monitoring of the                            patient's level of consciousness and physiological                            status; initial 15 minutes of intra-service time;                            patient age 40 years or older (additional time may                            be reported with [phone], as appropriate) Diagnosis Code(s):        --- Professional ---                           Z12.11, Encounter for screening for malignant                            neoplasm of colon                           K64.8, Other hemorrhoids CPT copyright 2019 [employer]. All rights reserved. The codes documented in this report are preliminary and upon coder review may  be revised to meet current compliance requirements. Hildred Laser, MD Hildred Laser, MD 08/30/2019 8:42:26 AM This report has been signed electronically. Number of Addenda: 0

## 2019-09-04 ENCOUNTER — Encounter (HOSPITAL_COMMUNITY): Payer: Self-pay | Admitting: Internal Medicine

## 2019-09-06 ENCOUNTER — Encounter: Payer: Self-pay | Admitting: Family Medicine

## 2019-09-06 ENCOUNTER — Ambulatory Visit: Payer: 59 | Admitting: Family Medicine

## 2019-09-06 ENCOUNTER — Other Ambulatory Visit: Payer: Self-pay

## 2019-09-06 VITALS — BP 122/74 | HR 64 | Temp 97.0°F | Ht 71.0 in | Wt 201.0 lb

## 2019-09-06 DIAGNOSIS — M10062 Idiopathic gout, left knee: Secondary | ICD-10-CM

## 2019-09-06 DIAGNOSIS — I4819 Other persistent atrial fibrillation: Secondary | ICD-10-CM | POA: Diagnosis not present

## 2019-09-06 DIAGNOSIS — Z8616 Personal history of COVID-19: Secondary | ICD-10-CM

## 2019-09-06 DIAGNOSIS — R7989 Other specified abnormal findings of blood chemistry: Secondary | ICD-10-CM

## 2019-09-06 DIAGNOSIS — M7541 Impingement syndrome of right shoulder: Secondary | ICD-10-CM

## 2019-09-06 DIAGNOSIS — I10 Essential (primary) hypertension: Secondary | ICD-10-CM | POA: Diagnosis not present

## 2019-09-06 MED ORDER — ALLOPURINOL 100 MG PO TABS: 200.0000 mg | ORAL_TABLET | Freq: Every day | ORAL | 1 refills | Status: DC

## 2019-09-06 MED ORDER — EPINEPHRINE 0.3 MG/0.3ML IJ SOAJ: 0.3000 mg | INTRAMUSCULAR | 0 refills | Status: DC | PRN

## 2019-09-06 MED ORDER — TRAZODONE HCL 50 MG PO TABS
50.0000 mg | ORAL_TABLET | Freq: Every evening | ORAL | 1 refills | Status: DC | PRN
Start: 2019-09-06 — End: 2019-11-07

## 2019-09-06 MED ORDER — EZETIMIBE 10 MG PO TABS: 10.0000 mg | ORAL_TABLET | Freq: Every day | ORAL | 5 refills | Status: DC

## 2019-09-06 MED ORDER — ESCITALOPRAM OXALATE 10 MG PO TABS: 10.0000 mg | ORAL_TABLET | Freq: Every day | ORAL | 5 refills | Status: DC

## 2019-09-06 NOTE — Patient Instructions (Signed)
ambien- Take 1/2 tablet for 1 wk and then take 1/2 tab every other day.  Then just discontinue.

## 2019-09-06 NOTE — Progress Notes (Signed)
Patient ID: Anthony [name], male    DOB: March 01, 1955, 64 y.o.   MRN: [id number]   Chief Complaint  Patient presents with  . Hypertension    follow up- right shoulder pain for few months- needs refill of Epi Pen   Subjective:    HPI Having shoulder pain, been going on for months. Waking up at night from sleep when rolling onto it. Rt shoulder pain with abduction, and pulling back. Some times rolls on it and wakes him up.  meds- tylenol prn.  Cr slightly elevated.  Ranging from 1.22- 1.54 over last 2 yrs.  In 2/20 cr went to 1.54, pt had covid.   Pt has h/o afib, and pt on 4 bp meds to get bp under control.   HTN- doing well with meds.  No issues. No chest pain, sob, dizziness, or leg swelling.   Medical History Anthony [name] has a past medical history of Anxiety, Chronic back pain, GERD (gastroesophageal reflux disease), Gout, Headache, History of kidney stones, Hyperlipidemia, Hypertension, Insomnia, Migraine headache, OSA on CPAP, Persistent atrial fibrillation ([place]), Scoliosis, Sinusitis, and Venous stasis.   Outpatient Encounter Medications as of 09/06/2019  Medication Sig  . albuterol (VENTOLIN HFA) 108 (90 Base) MCG/ACT inhaler Inhale 2 puffs into the lungs every 6 (six) hours as needed for wheezing or shortness of breath. INHALE 2 PUFFS BY MOUTH FOUR TIMES DAILY AS NEEDED FOR WHEEZING  . allopurinol (ZYLOPRIM) 100 MG tablet Take 2 tablets (200 mg total) by mouth daily.  . colchicine 0.6 MG tablet Take 1-2 tablets (0.6-1.2 mg total) by mouth See admin instructions. Take 1.31m in the morning and 0.640min the evening as needed for gout flare. (Patient taking differently: Take 0.6-1.2 mg by mouth 2 (two) times daily as needed (gout). Take 1.19m78mn the morning and 0.6mg17m the evening as needed for gout flare.)  . ELIQUIS 5 MG TABS tablet TAKE 1 TABLET BY MOUTH TWICE DAILY (Patient taking differently: Take 5 mg by mouth 2 (two) times daily. )  . EPINEPHrine (AUVI-Q) 0.3 mg/0.3 mL IJ  SOAJ injection Inject 0.3 mLs (0.3 mg total) into the muscle as needed for anaphylaxis. Use as directed for severe allergic reaction  . escitalopram (LEXAPRO) 10 MG tablet Take 1 tablet (10 mg total) by mouth daily. TAKE 1 TABLET(10 MG) BY MOUTH EVERY MORNING  . ezetimibe (ZETIA) 10 MG tablet Take 1 tablet (10 mg total) by mouth daily. TAKE 1 TABLET(10 MG) BY MOUTH DAILY  . hydrochlorothiazide (HYDRODIURIL) 25 MG tablet Take 1 tablet (25 mg total) by mouth daily. TAKE 1 TABLET(25 MG) BY MOUTH DAILY  . lisinopril (ZESTRIL) 20 MG tablet TAKE 1 TABLET BY MOUTH  TWICE DAILY (Patient taking differently: Take 20 mg by mouth in the morning and at bedtime. )  . loratadine (CLARITIN) 10 MG tablet Take 10 mg by mouth daily.  . metoprolol succinate (TOPROL-XL) 25 MG 24 hr tablet TAKE 1 TABLET(25 MG) BY MOUTH TWICE DAILY (Patient taking differently: Take 25 mg by mouth daily. )  . Multiple Vitamin (MULTIVITAMIN) tablet Take 1 tablet by mouth daily.  . pantoprazole (PROTONIX) 40 MG tablet Take 1 tablet (40 mg total) by mouth daily.  . SUMAtriptan (IMITREX) 50 MG tablet Take 1 tablet at first sign of headache, may take 2 tablets 2 hours later if needed (Patient taking differently: Take 50 mg by mouth every 2 (two) hours as needed for migraine or headache. Take 1 tablet at first sign of headache, may take 2  tablets 2 hours later if needed)  . traZODone (DESYREL) 50 MG tablet Take 1 tablet (50 mg total) by mouth at bedtime as needed for sleep.  . verapamil (CALAN-SR) 240 MG CR tablet TAKE 1 TABLET BY MOUTH AT  BEDTIME (Patient taking differently: Take 240 mg by mouth at bedtime. TAKE 1 TABLET BY MOUTH AT  BEDTIME)  . vitamin B-12 (CYANOCOBALAMIN) 1000 MCG tablet Take 1,000 mcg by mouth daily.  . [DISCONTINUED] allopurinol (ZYLOPRIM) 100 MG tablet TAKE 2 TABLETS BY MOUTH  DAILY  . [DISCONTINUED] EPINEPHrine (AUVI-Q) 0.3 mg/0.3 mL IJ SOAJ injection Use as directed for severe allergic reaction (Patient taking  differently: Inject 0.3 mg into the muscle as needed for anaphylaxis. Use as directed for severe allergic reaction)  . [DISCONTINUED] escitalopram (LEXAPRO) 10 MG tablet Take 1 tablet (10 mg total) by mouth daily. TAKE 1 TABLET(10 MG) BY MOUTH EVERY MORNING  . [DISCONTINUED] ezetimibe (ZETIA) 10 MG tablet TAKE 1 TABLET(10 MG) BY MOUTH DAILY (Patient taking differently: Take 10 mg by mouth daily. TAKE 1 TABLET(10 MG) BY MOUTH DAILY)  . [DISCONTINUED] zolpidem (AMBIEN) 10 MG tablet TAKE 1 TABLET BY MOUTH EVERY DAY AT BEDTIME (Patient taking differently: Take 10 mg by mouth at bedtime. TAKE 1 TABLET BY MOUTH EVERY DAY AT BEDTIME)   No facility-administered encounter medications on file as of 09/06/2019.     Review of Systems  Constitutional: Negative for chills and fever.  HENT: Negative for congestion, rhinorrhea and sore throat.   Respiratory: Negative for cough, shortness of breath and wheezing.   Cardiovascular: Negative for chest pain and leg swelling.  Gastrointestinal: Negative for abdominal pain, diarrhea, nausea and vomiting.  Genitourinary: Negative for dysuria and frequency.  Musculoskeletal:       +rt shoulder pain   Skin: Negative for rash.  Neurological: Negative for dizziness, weakness and headaches.     Vitals BP 122/74   Pulse 64   Temp (!) 97 F (36.1 C) (Oral)   Ht '5\' 11"'  (1.803 m)   Wt 201 lb (91.2 kg)   SpO2 99%   BMI 28.03 kg/m   Objective:   Physical Exam Vitals and nursing note reviewed.  Constitutional:      General: He is not in acute distress.    Appearance: Normal appearance. He is not ill-appearing.  HENT:     Head: Normocephalic.     Nose: Nose normal. No congestion.     Mouth/Throat:     Mouth: Mucous membranes are moist.     Pharynx: No oropharyngeal exudate.  Eyes:     Extraocular Movements: Extraocular movements intact.     Conjunctiva/sclera: Conjunctivae normal.     Pupils: Pupils are equal, round, and reactive to light.    Cardiovascular:     Rate and Rhythm: Normal rate and regular rhythm.     Pulses: Normal pulses.     Heart sounds: Normal heart sounds. No murmur heard.   Pulmonary:     Effort: Pulmonary effort is normal. No respiratory distress.     Breath sounds: Normal breath sounds. No wheezing, rhonchi or rales.  Musculoskeletal:        General: Tenderness (rt shoulder) present.     Cervical back: Normal range of motion. No tenderness.     Right lower leg: No edema.     Left lower leg: No edema.     Comments: +dec rom with abduction with rt shoulder.  +hawkins, neg-neer's. No neck ttp or ttp over spinous process. +ttp on  anterior shoulder.   Skin:    General: Skin is warm and dry.     Findings: No rash.  Neurological:     General: No focal deficit present.     Mental Status: He is alert and oriented to person, place, and time.     Cranial Nerves: No cranial nerve deficit.     Motor: No weakness.  Psychiatric:        Mood and Affect: Mood normal.        Behavior: Behavior normal.        Thought Content: Thought content normal.        Judgment: Judgment normal.      Assessment and Plan   1. Essential hypertension - CBC - CMP14+EGFR - Lipid panel  2. History of COVID-19  3. Persistent atrial fibrillation (HCC) - CBC - CMP14+EGFR - Lipid panel  4. Acute idiopathic gout of left knee - allopurinol (ZYLOPRIM) 100 MG tablet; Take 2 tablets (200 mg total) by mouth daily.  Dispense: 180 tablet; Refill: 1  5. Shoulder impingement syndrome, right  6. Elevated serum creatinine    H/o Afib- doing well.  Controlled.  Cont meds.  F/u cards. htn- controlled, cont meds. Cont f/u cards. Elevated Cr- slightly elevated, will cont to monitor.  H/o covid- in 2/20, improved since then.   Need refill epi pen.  Rt shoulder pain- rest, ice, heat. Will give trial Tramadol.  Tylenol during day prn.  Call in 2-3 wks if not improving, may need ortho referral.  Insomnia- Gave taper info and  pt wanting to stop taking this 78m ambien. Hasn't been taking nightly.  Only prn. Discussed risk vs. Benefit.  Pt wanting to discontinue.  Will try trial of trazodone.   F/u 675mor prn.

## 2019-09-19 ENCOUNTER — Other Ambulatory Visit: Payer: Self-pay

## 2019-09-19 ENCOUNTER — Ambulatory Visit
Admission: EM | Admit: 2019-09-19 | Discharge: 2019-09-19 | Disposition: A | Discharge status: Home / Self Care | Payer: 59 | Attending: Emergency Medicine | Admitting: Emergency Medicine

## 2019-09-19 DIAGNOSIS — Z1152 Encounter for screening for COVID-19: Secondary | ICD-10-CM | POA: Diagnosis not present

## 2019-09-19 DIAGNOSIS — J029 Acute pharyngitis, unspecified: Secondary | ICD-10-CM | POA: Diagnosis not present

## 2019-09-19 DIAGNOSIS — J069 Acute upper respiratory infection, unspecified: Secondary | ICD-10-CM

## 2019-09-19 MED ORDER — FLUTICASONE PROPIONATE 50 MCG/ACT NA SUSP
1.0000 | Freq: Every day | NASAL | 0 refills | Status: DC
Start: 2019-09-19 — End: 2019-12-27

## 2019-09-19 MED ORDER — PREDNISONE 10 MG PO TABS
20.0000 mg | ORAL_TABLET | Freq: Every day | ORAL | 0 refills | Status: DC
Start: 2019-09-19 — End: 2019-12-27

## 2019-09-19 MED ORDER — CETIRIZINE HCL 10 MG PO TABS: 10.0000 mg | ORAL_TABLET | Freq: Every day | ORAL | 0 refills | Status: DC

## 2019-09-19 MED ORDER — BENZONATATE 100 MG PO CAPS
100.0000 mg | ORAL_CAPSULE | Freq: Three times a day (TID) | ORAL | 0 refills | Status: DC
Start: 2019-09-19 — End: 2019-12-27

## 2019-09-19 NOTE — Discharge Instructions (Addendum)
COVID testing ordered.  It will take between 2-7 days for test results.  Someone will contact you regarding abnormal results.    In the meantime: You should remain isolated in your home for 10 days from symptom onset AND greater than 24 hours after symptoms resolution (absence of fever without the use of fever-reducing medication and improvement in respiratory symptoms), whichever is longer Get plenty of rest and push fluids Tessalon Perles prescribed for cough Zyrtec for nasal congestion, runny nose, and/or sore throat flonase for nasal congestion and runny nose Low-dose prednisone was prescribed Use medications daily for symptom relief Use OTC medications like ibuprofen or tylenol as needed fever or pain Call or go to the ED if you have any new or worsening symptoms such as fever, worsening cough, shortness of breath, chest tightness, chest pain, turning blue, changes in mental status, etc..Marland Kitchen
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
## 2019-09-21 LAB — NOVEL CORONAVIRUS, NAA: SARS-CoV-2, NAA: NOT DETECTED

## 2019-10-21 ENCOUNTER — Other Ambulatory Visit: Payer: Self-pay | Admitting: Family Medicine

## 2019-10-21 DIAGNOSIS — M10062 Idiopathic gout, left knee: Secondary | ICD-10-CM

## 2019-11-03 ENCOUNTER — Other Ambulatory Visit: Payer: Self-pay | Admitting: Family Medicine

## 2019-11-06 NOTE — Telephone Encounter (Signed)
Pls call pt to see how he's doing with the trazodone.  Is it working for his sleep? Any side effects?  Thx.   Dr. Lovena Le

## 2019-11-07 NOTE — Telephone Encounter (Signed)
Patient reports continued trouble falling asleep but doing ok. Wants to know if the dosage can be increased.

## 2019-11-15 ENCOUNTER — Other Ambulatory Visit: Payer: Self-pay | Admitting: Internal Medicine

## 2019-11-15 NOTE — Telephone Encounter (Signed)
Prescription refill request for Eliquis received.  Last office visit: Kayleen Memos 05/02/2019 Scr: 1.29, 04/23/2019 Age:  65 y.o. Weight: 91.2 kg   Prescription refill sent.

## 2019-11-26 ENCOUNTER — Other Ambulatory Visit: Payer: Self-pay | Admitting: Family Medicine

## 2019-11-29 ENCOUNTER — Other Ambulatory Visit: Payer: Self-pay | Admitting: Family Medicine

## 2019-12-09 ENCOUNTER — Other Ambulatory Visit: Payer: Self-pay | Admitting: Family Medicine

## 2019-12-27 ENCOUNTER — Telehealth: Payer: Self-pay

## 2019-12-27 ENCOUNTER — Telehealth (INDEPENDENT_AMBULATORY_CARE_PROVIDER_SITE_OTHER): Payer: 59 | Admitting: Family Medicine

## 2019-12-27 ENCOUNTER — Other Ambulatory Visit: Payer: Self-pay

## 2019-12-27 DIAGNOSIS — R059 Cough, unspecified: Secondary | ICD-10-CM | POA: Diagnosis not present

## 2019-12-27 MED ORDER — GUAIFENESIN-CODEINE 100-10 MG/5ML PO SOLN: 5.0000 mL | Freq: Three times a day (TID) | ORAL | 0 refills | Status: DC | PRN

## 2019-12-27 NOTE — Progress Notes (Signed)
Patient ID: BRAXSON HOLLINGSWORTH, male    DOB: 1955-05-19, 64 y.o.   MRN: [id number] Virtual Visit via Telephone Note  I connected with [name] on 12/27/19 at  9:30 AM EST by telephone and verified that I am speaking with the correct person using two identifiers.  Location: Patient: home Provider: office   I discussed the limitations, risks, security and privacy concerns of performing an evaluation and management service by telephone and the availability of in person appointments. I also discussed with the patient that there may be a patient responsible charge related to this service. The patient expressed understanding and agreed to proceed.   History of Present Illness:    Observations/Objective:   Assessment and Plan:   Follow Up Instructions:    I discussed the assessment and treatment plan with the patient. The patient was provided an opportunity to ask questions and all were answered. The patient agreed with the plan and demonstrated an understanding of the instructions.   The patient was advised to call back or seek an in-person evaluation if the symptoms worsen or if the condition fails to improve as anticipated.  I provided 8 minutes of non-face-to-face time during this encounter.   Chalmers Guest, NP    Chief Complaint  Patient presents with  . Cough   Subjective:  CC: cough  This is a new problem.  Presents today via telephone visit with a complaint of cough, runny nose, sore throat, chest tightness, and wheezing.  Symptoms started yesterday.  Denies fever, chills, denies  abdominal pain.  Has tried his albuterol inhaler for the wheezing, has used 3 times in the past 3 days which has helped some.  Has also tried Tylenol and Mucinex, has not done a Covid test.  Able to take adequate fluids and is keeping an eye on his blood pressure.  Reports that [employer] do not usually help.   Cough This is a new problem. Episode onset: yesterday. The cough is  productive of sputum. Associated symptoms include nasal congestion, postnasal drip, a sore throat, shortness of breath and wheezing. Pertinent negatives include no chills, ear pain or fever. Associated symptoms comments: Chest tightness, slight SOB with coughing.  . Treatments tried: tylenol, mucinex.     Medical History Mung has a past medical history of Anxiety, Chronic back pain, GERD (gastroesophageal reflux disease), Gout, Headache, History of kidney stones, Hyperlipidemia, Hypertension, Insomnia, Migraine headache, OSA on CPAP, Persistent atrial fibrillation ([place]), Scoliosis, Sinusitis, and Venous stasis.   Outpatient Encounter Medications as of 12/27/2019  Medication Sig  . albuterol (VENTOLIN HFA) 108 (90 Base) MCG/ACT inhaler Inhale 2 puffs into the lungs every 6 (six) hours as needed for wheezing or shortness of breath. INHALE 2 PUFFS BY MOUTH FOUR TIMES DAILY AS NEEDED FOR WHEEZING  . allopurinol (ZYLOPRIM) 100 MG tablet TAKE 2 TABLETS BY MOUTH  DAILY  . cetirizine (ZYRTEC ALLERGY) 10 MG tablet Take 1 tablet (10 mg total) by mouth daily.  . colchicine 0.6 MG tablet Take 1-2 tablets (0.6-1.2 mg total) by mouth See admin instructions. Take 1.2mg  in the morning and 0.6mg  in the evening as needed for gout flare. (Patient taking differently: Take 0.6-1.2 mg by mouth 2 (two) times daily as needed (gout). Take 1.2mg  in the morning and 0.6mg  in the evening as needed for gout flare.)  . ELIQUIS 5 MG TABS tablet TAKE 1 TABLET BY MOUTH TWICE DAILY  . EPINEPHrine (AUVI-Q) 0.3 mg/0.3 mL IJ SOAJ injection Inject 0.3 mLs (  0.3 mg total) into the muscle as needed for anaphylaxis. Use as directed for severe allergic reaction  . escitalopram (LEXAPRO) 10 MG tablet Take 1 tablet (10 mg total) by mouth daily. TAKE 1 TABLET(10 MG) BY MOUTH EVERY MORNING  . ezetimibe (ZETIA) 10 MG tablet Take 1 tablet (10 mg total) by mouth daily. TAKE 1 TABLET(10 MG) BY MOUTH DAILY  . guaiFENesin-codeine 100-10 MG/5ML syrup  Take 5 mLs by mouth 3 (three) times daily as needed for cough.  . hydrochlorothiazide (HYDRODIURIL) 25 MG tablet Take 1 tablet (25 mg total) by mouth daily. TAKE 1 TABLET(25 MG) BY MOUTH DAILY  . lisinopril (ZESTRIL) 20 MG tablet TAKE 1 TABLET BY MOUTH  TWICE DAILY  . loratadine (CLARITIN) 10 MG tablet Take 10 mg by mouth daily.  . metoprolol succinate (TOPROL-XL) 25 MG 24 hr tablet TAKE 1 TABLET(25 MG) BY MOUTH TWICE DAILY (Patient taking differently: Take 25 mg by mouth daily. )  . Multiple Vitamin (MULTIVITAMIN) tablet Take 1 tablet by mouth daily.  . pantoprazole (PROTONIX) 40 MG tablet TAKE 1 TABLET BY MOUTH  DAILY  . SUMAtriptan (IMITREX) 50 MG tablet Take 1 tablet at first sign of headache, may take 2 tablets 2 hours later if needed (Patient taking differently: Take 50 mg by mouth every 2 (two) hours as needed for migraine or headache. Take 1 tablet at first sign of headache, may take 2 tablets 2 hours later if needed)  . traZODone (DESYREL) 100 MG tablet Take 1-1 1/2 tab p.o. qhs for insomnia.  . verapamil (CALAN-SR) 240 MG CR tablet TAKE 1 TABLET BY MOUTH AT  BEDTIME  . vitamin B-12 (CYANOCOBALAMIN) 1000 MCG tablet Take 1,000 mcg by mouth daily.  . [DISCONTINUED] benzonatate (TESSALON) 100 MG capsule Take 1 capsule (100 mg total) by mouth every 8 (eight) hours.  . [DISCONTINUED] fluticasone (FLONASE) 50 MCG/ACT nasal spray Place 1 spray into both nostrils daily for 14 days.  . [DISCONTINUED] predniSONE (DELTASONE) 10 MG tablet Take 2 tablets (20 mg total) by mouth daily.   No facility-administered encounter medications on file as of 12/27/2019.     Review of Systems  Constitutional: Negative for chills and fever.  HENT: Positive for postnasal drip and sore throat. Negative for ear pain.   Respiratory: Positive for cough, chest tightness, shortness of breath and wheezing.        With coughing.     Vitals There were no vitals taken for this visit.  Objective:   Physical  Exam Unable to preform.  Able to converse with me during telephone visit.  Assessment and Plan   1. Cough in adult patient - guaiFENesin-codeine 100-10 MG/5ML syrup; Take 5 mLs by mouth 3 (three) times daily as needed for cough.  Dispense: 120 mL; Refill: 0   Will treat cough, strongly recommended to get a Covid test today as an outpatient.  Warnings to seek emergent care: Shortness of breath, chest pain not associated with coughing.  Agrees with plan of care discussed today. Understands warning signs to seek further care: Chest pain, shortness of breath not associated with coughing, any significant change in health. Understands to follow-up if symptoms do not improve, or worsen.  Pecolia Ades, FNP-C

## 2019-12-27 NOTE — Telephone Encounter (Signed)
This is likely  viral in nature and an antibiotic is not indicated. If he does not improve with the cough medicine in a few days, he can call to be seen in person for an assessment. I recommend a Covid test.  Thanks, KD

## 2019-12-27 NOTE — Telephone Encounter (Signed)
Patient advised per Santiago Glad NP: This is likely viral in nature and an antibiotic is not indicated. If he does not improve with the cough medicine in a few days, he can call to be seen in person for an assessment. Santiago Glad NP recommends a Covid test. Patient verbalized understanding and stated he has a covid test scheduled for Monday.

## 2019-12-27 NOTE — Telephone Encounter (Signed)
Pt called back about his appt this morning wanting to know if antibiotic will be called in along with the cough med?  Pt call back [phone]

## 2020-02-08 ENCOUNTER — Other Ambulatory Visit: Payer: Self-pay | Admitting: Family Medicine

## 2020-02-11 MED ORDER — TRAZODONE HCL 100 MG PO TABS: ORAL_TABLET | ORAL | 2 refills | Status: DC

## 2020-02-11 NOTE — Addendum Note (Signed)
Addended by: Patsy Lager on: 02/11/2020 01:56 PM   Modules accepted: Orders

## 2020-02-25 ENCOUNTER — Other Ambulatory Visit: Payer: Self-pay

## 2020-02-25 MED ORDER — EZETIMIBE 10 MG PO TABS: 10.0000 mg | ORAL_TABLET | Freq: Every day | ORAL | 1 refills | Status: DC

## 2020-03-11 ENCOUNTER — Ambulatory Visit: Payer: 59 | Admitting: Family Medicine

## 2020-03-25 ENCOUNTER — Other Ambulatory Visit: Payer: Self-pay | Admitting: Family Medicine

## 2020-03-25 NOTE — Telephone Encounter (Signed)
Sent my chart message to schedule appt

## 2020-03-26 ENCOUNTER — Telehealth: Payer: Self-pay

## 2020-03-26 DIAGNOSIS — Z0000 Encounter for general adult medical examination without abnormal findings: Secondary | ICD-10-CM

## 2020-03-26 DIAGNOSIS — I10 Essential (primary) hypertension: Secondary | ICD-10-CM

## 2020-03-26 DIAGNOSIS — Z125 Encounter for screening for malignant neoplasm of prostate: Secondary | ICD-10-CM

## 2020-03-26 DIAGNOSIS — E785 Hyperlipidemia, unspecified: Secondary | ICD-10-CM

## 2020-03-26 NOTE — Telephone Encounter (Signed)
Orders put in and pt was notified.  

## 2020-03-26 NOTE — Telephone Encounter (Signed)
Pt has Phy on 04/27 will need blood work   Pt call back [phone]

## 2020-03-26 NOTE — Telephone Encounter (Signed)
Lipid, cmp, cbc active orders from august and have expired. Do you want him to do any additional test

## 2020-03-26 NOTE — Telephone Encounter (Signed)
Yes pls reorder. Thx. Dr. Darene Lamer

## 2020-03-27 NOTE — Telephone Encounter (Signed)
Patient has a physical on 4/27 and wanting to know if he can get refill on medication .

## 2020-04-26 ENCOUNTER — Other Ambulatory Visit: Payer: Self-pay | Admitting: Family Medicine

## 2020-04-28 ENCOUNTER — Other Ambulatory Visit: Payer: Self-pay

## 2020-04-28 MED ORDER — LISINOPRIL 20 MG PO TABS: 1.0000 | ORAL_TABLET | Freq: Two times a day (BID) | ORAL | 0 refills | Status: DC

## 2020-04-28 MED ORDER — PANTOPRAZOLE SODIUM 40 MG PO TBEC: 1.0000 | DELAYED_RELEASE_TABLET | Freq: Every day | ORAL | 0 refills | Status: DC

## 2020-05-06 DIAGNOSIS — Z0000 Encounter for general adult medical examination without abnormal findings: Secondary | ICD-10-CM | POA: Diagnosis not present

## 2020-05-06 DIAGNOSIS — Z125 Encounter for screening for malignant neoplasm of prostate: Secondary | ICD-10-CM | POA: Diagnosis not present

## 2020-05-06 DIAGNOSIS — E785 Hyperlipidemia, unspecified: Secondary | ICD-10-CM | POA: Diagnosis not present

## 2020-05-06 DIAGNOSIS — I10 Essential (primary) hypertension: Secondary | ICD-10-CM | POA: Diagnosis not present

## 2020-05-07 LAB — COMPREHENSIVE METABOLIC PANEL
ALT: 25 IU/L (ref 0–44)
AST: 20 IU/L (ref 0–40)
Albumin/Globulin Ratio: 2.2 (ref 1.2–2.2)
Albumin: 4.7 g/dL (ref 3.8–4.8)
Alkaline Phosphatase: 73 IU/L (ref 44–121)
BUN/Creatinine Ratio: 10 (ref 10–24)
BUN: 12 mg/dL (ref 8–27)
Bilirubin Total: 0.4 mg/dL (ref 0.0–1.2)
CO2: 24 mmol/L (ref 20–29)
Calcium: 9.5 mg/dL (ref 8.6–10.2)
Chloride: 104 mmol/L (ref 96–106)
Creatinine, Ser: 1.22 mg/dL (ref 0.76–1.27)
Globulin, Total: 2.1 g/dL (ref 1.5–4.5)
Glucose: 98 mg/dL (ref 65–99)
Potassium: 4.4 mmol/L (ref 3.5–5.2)
Sodium: 147 mmol/L — ABNORMAL HIGH (ref 134–144)
Total Protein: 6.8 g/dL (ref 6.0–8.5)
eGFR: 66 mL/min/{1.73_m2} (ref >59)

## 2020-05-07 LAB — CBC WITH DIFFERENTIAL/PLATELET
Basophils Absolute: 0.1 10*3/uL (ref 0.0–0.2)
Basos: 1 %
EOS (ABSOLUTE): 0.3 10*3/uL (ref 0.0–0.4)
Eos: 5 %
Hematocrit: 45.2 % (ref 37.5–51.0)
Hemoglobin: 15.4 g/dL (ref 13.0–17.7)
Immature Grans (Abs): 0 10*3/uL (ref 0.0–0.1)
Immature Granulocytes: 0 %
Lymphocytes Absolute: 1.8 10*3/uL (ref 0.7–3.1)
Lymphs: 29 %
MCH: 31.2 pg (ref 26.6–33.0)
MCHC: 34.1 g/dL (ref 31.5–35.7)
MCV: 92 fL (ref 79–97)
Monocytes Absolute: 0.5 10*3/uL (ref 0.1–0.9)
Monocytes: 8 %
Neutrophils Absolute: 3.6 10*3/uL (ref 1.4–7.0)
Neutrophils: 57 %
Platelets: 184 10*3/uL (ref 150–450)
RBC: 4.94 x10E6/uL (ref 4.14–5.80)
RDW: 12.1 % (ref 11.6–15.4)
WBC: 6.2 10*3/uL (ref 3.4–10.8)

## 2020-05-07 LAB — LIPID PANEL
Chol/HDL Ratio: 5.5 ratio — ABNORMAL HIGH (ref 0.0–5.0)
Cholesterol, Total: 205 mg/dL — ABNORMAL HIGH (ref 100–199)
HDL: 37 mg/dL — ABNORMAL LOW (ref >39)
LDL Chol Calc (NIH): 153 mg/dL — ABNORMAL HIGH (ref 0–99)
Triglycerides: 81 mg/dL (ref 0–149)
VLDL Cholesterol Cal: 15 mg/dL (ref 5–40)

## 2020-05-07 LAB — PSA: Prostate Specific Ag, Serum: 0.8 ng/mL (ref 0.0–4.0)

## 2020-05-14 ENCOUNTER — Other Ambulatory Visit: Payer: Self-pay

## 2020-05-14 ENCOUNTER — Encounter: Payer: Self-pay | Admitting: Family Medicine

## 2020-05-14 ENCOUNTER — Ambulatory Visit (INDEPENDENT_AMBULATORY_CARE_PROVIDER_SITE_OTHER): Payer: BC Managed Care – PPO | Admitting: Family Medicine

## 2020-05-14 VITALS — BP 165/91 | HR 60 | Temp 97.2°F | Ht 71.0 in | Wt 198.0 lb

## 2020-05-14 DIAGNOSIS — K219 Gastro-esophageal reflux disease without esophagitis: Secondary | ICD-10-CM | POA: Diagnosis not present

## 2020-05-14 DIAGNOSIS — Z789 Other specified health status: Secondary | ICD-10-CM

## 2020-05-14 DIAGNOSIS — E7800 Pure hypercholesterolemia, unspecified: Secondary | ICD-10-CM

## 2020-05-14 DIAGNOSIS — I4819 Other persistent atrial fibrillation: Secondary | ICD-10-CM

## 2020-05-14 DIAGNOSIS — I10 Essential (primary) hypertension: Secondary | ICD-10-CM

## 2020-05-14 DIAGNOSIS — Z0000 Encounter for general adult medical examination without abnormal findings: Secondary | ICD-10-CM | POA: Diagnosis not present

## 2020-05-14 MED ORDER — HYDROCHLOROTHIAZIDE 25 MG PO TABS: 12.5000 mg | ORAL_TABLET | Freq: Every day | ORAL | 2 refills | Status: DC

## 2020-05-14 MED ORDER — PANTOPRAZOLE SODIUM 40 MG PO TBEC: 1.0000 | DELAYED_RELEASE_TABLET | Freq: Every day | ORAL | 1 refills | Status: DC

## 2020-05-14 MED ORDER — VERAPAMIL HCL ER 240 MG PO TBCR: 240.0000 mg | EXTENDED_RELEASE_TABLET | Freq: Every day | ORAL | 1 refills | Status: DC

## 2020-05-14 MED ORDER — EZETIMIBE 10 MG PO TABS: 10.0000 mg | ORAL_TABLET | Freq: Every day | ORAL | 1 refills | Status: DC

## 2020-05-14 MED ORDER — LISINOPRIL 20 MG PO TABS: 1.0000 | ORAL_TABLET | Freq: Two times a day (BID) | ORAL | 1 refills | Status: DC

## 2020-05-14 NOTE — Patient Instructions (Signed)
Preventing High Cholesterol Cholesterol is a white, waxy substance similar to fat that the human body needs to help build cells. The liver makes all the cholesterol that a person's body needs. Having high cholesterol (hypercholesterolemia) increases your risk for heart disease and stroke. Extra or excess cholesterol comes from the food that you eat. High cholesterol can often be prevented with diet and lifestyle changes. If you already have high cholesterol, you can control it with diet, lifestyle changes, and medicines. How can high cholesterol affect me? If you have high cholesterol, fatty deposits (plaques) may build up on the walls of your blood vessels. The blood vessels that carry blood away from your heart are called arteries. Plaques make the arteries narrower and stiffer. This in turn can:  Restrict or block blood flow and cause blood clots to form.  Increase your risk for heart attack and stroke. What can increase my risk for high cholesterol? This condition is more likely to develop in people who:  Eat foods that are high in saturated fat or cholesterol. Saturated fat is mostly found in foods that come from animal sources.  Are overweight.  Are not getting enough exercise.  Have a family history of high cholesterol (familial hypercholesterolemia). What actions can I take to prevent this? Nutrition  Eat less saturated fat.  Avoid trans fats (partially hydrogenated oils). These are often found in margarine and in some baked goods, fried foods, and snacks bought in packages.  Avoid precooked or cured meat, such as bacon, sausages, or meat loaves.  Avoid foods and drinks that have added sugars.  Eat more fruits, vegetables, and whole grains.  Choose healthy sources of protein, such as fish, poultry, lean cuts of red meat, beans, peas, lentils, and nuts.  Choose healthy sources of fat, such as: ? Nuts. ? Vegetable oils, especially olive oil. ? Fish that have healthy fats,  such as omega-3 fatty acids. These fish include mackerel or salmon.   Lifestyle  Lose weight if you are overweight. Maintaining a healthy body mass index (BMI) can help prevent or control high cholesterol. It can also lower your risk for diabetes and high blood pressure. Ask your health care provider to help you with a diet and exercise plan to lose weight safely.  Do not use any products that contain nicotine or tobacco, such as cigarettes, e-cigarettes, and chewing tobacco. If you need help quitting, ask your health care provider. Alcohol use  Do not drink alcohol if: ? Your health care provider tells you not to drink. ? You are pregnant, may be pregnant, or are planning to become pregnant.  If you drink alcohol: ? Limit how much you use to:  0-1 drink a day for women.  0-2 drinks a day for men. ? Be aware of how much alcohol is in your drink. In the [place] one drink equals one 12 oz bottle of beer (355 mL), one 5 oz glass of wine (148 mL), or one 1 oz glass of hard liquor (44 mL). Activity  Get enough exercise. Do exercises as told by your health care provider.  Each week, do at least 150 minutes of exercise that takes a medium level of effort (moderate-intensity exercise). This kind of exercise: ? Makes your heart beat faster while allowing you to still be able to talk. ? Can be done in short sessions several times a day or longer sessions a few times a week. For example, on 5 days each week, you could walk fast or ride   your bike 3 times a day for 10 minutes each time.   Medicines  Your health care provider may recommend medicines to help lower cholesterol. This may be a medicine to lower the amount of cholesterol that your liver makes. You may need medicine if: ? Diet and lifestyle changes have not lowered your cholesterol enough. ? You have high cholesterol and other risk factors for heart disease or stroke.  Take over-the-counter and prescription medicines only as told by your  health care provider. General information  Manage your risk factors for high cholesterol. Talk with your health care provider about all your risk factors and how to lower your risk.  Manage other conditions that you have, such as diabetes or high blood pressure (hypertension).  Have blood tests to check your cholesterol levels at regular points in time as told by your health care provider.  Keep all follow-up visits as told by your health care provider. This is important. Where to find more information  American Heart Association: www.heart.org  National Heart, Lung, and Blood Institute: www.nhlbi.nih.gov Summary  High cholesterol increases your risk for heart disease and stroke. By keeping your cholesterol level low, you can reduce your risk for these conditions.  High cholesterol can often be prevented with diet and lifestyle changes.  Work with your health care provider to manage your risk factors, and have your blood tested regularly. This information is not intended to replace advice given to you by your health care provider. Make sure you discuss any questions you have with your health care provider. Document Revised: 10/17/2018 Document Reviewed: 10/17/2018 Elsevier Patient Education  2021 Elsevier Inc.  

## 2020-05-14 NOTE — Progress Notes (Signed)
Patient ID: Anthony [name], male    DOB: 03/24/1955, 65 y.o.   MRN: [id number]   Chief Complaint  Patient presents with  . Annual Exam   Subjective:    HPI The patient comes in today for a wellness visit.  A review of their health history was completed.  A review of medications was also completed.  Any needed refills; yes  Eating habits: good  Falls/  MVA accidents in past few months: no  Regular exercise: yard work, walk  Specialist pt sees on regular basis: heart doctor  Preventative health issues were discussed.   Additional concerns: insomia  Seeing cardiology- for afib.  Taking eliquis for Afib.  htn- taking 4 meds for blood pressure. Took bp meds this am 7am Seeing cards in 6/21. At home seeing [id number] systolic   BP Readings from Last 3 Encounters:  05/14/20 (!) 165/91  09/19/19 134/77  09/06/19 122/74   hld- Pt on zetia- not able to tolerate statins. Does eat ice cream and not watching diet as much for cholesterol.  Eating more breakfast foods. Pt eating bacon, occ eggs,  Red meat too.  Eating with friend for breakfast.    Medical History Jakaleb has a past medical history of Anxiety, Chronic back pain, GERD (gastroesophageal reflux disease), Gout, Headache, History of kidney stones, Hyperlipidemia, Hypertension, Insomnia, Migraine headache, OSA on CPAP, Persistent atrial fibrillation ([place]), Scoliosis, Sinusitis, and Venous stasis.   Outpatient Encounter Medications as of 05/14/2020  Medication Sig  . albuterol (VENTOLIN HFA) 108 (90 Base) MCG/ACT inhaler Inhale 2 puffs into the lungs every 6 (six) hours as needed for wheezing or shortness of breath. INHALE 2 PUFFS BY MOUTH FOUR TIMES DAILY AS NEEDED FOR WHEEZING  . allopurinol (ZYLOPRIM) 100 MG tablet TAKE 2 TABLETS BY MOUTH  DAILY  . cetirizine (ZYRTEC ALLERGY) 10 MG tablet Take 1 tablet (10 mg total) by mouth daily.  . colchicine 0.6 MG tablet Take 1-2 tablets (0.6-1.2 mg total) by mouth See  admin instructions. Take 1.2mg  in the morning and 0.6mg  in the evening as needed for gout flare. (Patient taking differently: Take 0.6-1.2 mg by mouth 2 (two) times daily as needed (gout). Take 1.2mg  in the morning and 0.6mg  in the evening as needed for gout flare.)  . ELIQUIS 5 MG TABS tablet TAKE 1 TABLET BY MOUTH TWICE DAILY  . EPINEPHrine (AUVI-Q) 0.3 mg/0.3 mL IJ SOAJ injection Inject 0.3 mLs (0.3 mg total) into the muscle as needed for anaphylaxis. Use as directed for severe allergic reaction  . ezetimibe (ZETIA) 10 MG tablet Take 1 tablet (10 mg total) by mouth daily. Needs appt.  . hydrochlorothiazide (HYDRODIURIL) 25 MG tablet Take 0.5 tablets (12.5 mg total) by mouth daily. TAKE 1 TABLET(25 MG) BY MOUTH DAILY  . lisinopril (ZESTRIL) 20 MG tablet Take 1 tablet (20 mg total) by mouth 2 (two) times daily.  Marland Kitchen loratadine (CLARITIN) 10 MG tablet Take 10 mg by mouth daily.  . metoprolol succinate (TOPROL-XL) 25 MG 24 hr tablet TAKE 1 TABLET(25 MG) BY MOUTH TWICE DAILY (Patient taking differently: Take 25 mg by mouth daily. )  . Multiple Vitamin (MULTIVITAMIN) tablet Take 1 tablet by mouth daily.  . pantoprazole (PROTONIX) 40 MG tablet Take 1 tablet (40 mg total) by mouth daily.  . SUMAtriptan (IMITREX) 50 MG tablet Take 1 tablet at first sign of headache, may take 2 tablets 2 hours later if needed (Patient taking differently: Take 50 mg by mouth every 2 (two) hours as  needed for migraine or headache. Take 1 tablet at first sign of headache, may take 2 tablets 2 hours later if needed)  . verapamil (CALAN-SR) 240 MG CR tablet Take 1 tablet (240 mg total) by mouth at bedtime.  . vitamin B-12 (CYANOCOBALAMIN) 1000 MCG tablet Take 1,000 mcg by mouth daily.  . [DISCONTINUED] escitalopram (LEXAPRO) 10 MG tablet Take 1 tablet (10 mg total) by mouth daily. TAKE 1 TABLET(10 MG) BY MOUTH EVERY MORNING  . [DISCONTINUED] ezetimibe (ZETIA) 10 MG tablet TAKE 1 TABLET (10 MG TOTAL) BY MOUTH DAILY. NEEDS APPT.  .  [DISCONTINUED] guaiFENesin-codeine 100-10 MG/5ML syrup Take 5 mLs by mouth 3 (three) times daily as needed for cough.  . [DISCONTINUED] hydrochlorothiazide (HYDRODIURIL) 25 MG tablet Take 1 tablet (25 mg total) by mouth daily. TAKE 1 TABLET(25 MG) BY MOUTH DAILY  . [DISCONTINUED] lisinopril (ZESTRIL) 20 MG tablet Take 1 tablet (20 mg total) by mouth 2 (two) times daily.  . [DISCONTINUED] pantoprazole (PROTONIX) 40 MG tablet Take 1 tablet (40 mg total) by mouth daily.  . [DISCONTINUED] traZODone (DESYREL) 100 MG tablet 1-1 AND 1/2 TABLETS AT BEDTIME FOR INSOMNIA  . [DISCONTINUED] verapamil (CALAN-SR) 240 MG CR tablet TAKE 1 TABLET BY MOUTH AT  BEDTIME   No facility-administered encounter medications on file as of 05/14/2020.     Review of Systems  Constitutional: Negative for chills and fever.  HENT: Negative for congestion, rhinorrhea and sore throat.   Respiratory: Negative for cough, shortness of breath and wheezing.   Cardiovascular: Negative for chest pain and leg swelling.  Gastrointestinal: Negative for abdominal pain, diarrhea, nausea and vomiting.  Genitourinary: Negative for dysuria and frequency.  Skin: Negative for rash.  Neurological: Negative for dizziness, weakness and headaches.     Vitals BP (!) 165/91   Pulse 60   Temp (!) 97.2 F (36.2 C)   Ht 5\' 11"  (1.803 m)   Wt 198 lb (89.8 kg)   SpO2 98%   BMI 27.62 kg/m   Objective:   Physical Exam Vitals and nursing note reviewed.  Constitutional:      General: He is not in acute distress.    Appearance: Normal appearance. He is not ill-appearing.  HENT:     Head: Normocephalic.     Nose: Nose normal. No congestion.     Mouth/Throat:     Mouth: Mucous membranes are moist.     Pharynx: No oropharyngeal exudate.  Eyes:     Extraocular Movements: Extraocular movements intact.     Conjunctiva/sclera: Conjunctivae normal.     Pupils: Pupils are equal, round, and reactive to light.  Cardiovascular:     Rate and  Rhythm: Normal rate and regular rhythm.     Pulses: Normal pulses.     Heart sounds: Normal heart sounds. No murmur heard.   Pulmonary:     Effort: Pulmonary effort is normal.     Breath sounds: Normal breath sounds. No wheezing, rhonchi or rales.  Musculoskeletal:        General: Normal range of motion.     Right lower leg: No edema.     Left lower leg: No edema.  Skin:    General: Skin is warm and dry.     Findings: No rash.  Neurological:     General: No focal deficit present.     Mental Status: He is alert and oriented to person, place, and time.     Cranial Nerves: No cranial nerve deficit.  Psychiatric:  Mood and Affect: Mood normal.        Behavior: Behavior normal.        Thought Content: Thought content normal.        Judgment: Judgment normal.      Assessment and Plan   1. Well adult exam  2. Statin intolerance  3. Gastroesophageal reflux disease, unspecified whether esophagitis present - pantoprazole (PROTONIX) 40 MG tablet; Take 1 tablet (40 mg total) by mouth daily.  Dispense: 90 tablet; Refill: 1  4. Persistent atrial fibrillation ([place])  5. Essential hypertension, benign - hydrochlorothiazide (HYDRODIURIL) 25 MG tablet; Take 0.5 tablets (12.5 mg total) by mouth daily. TAKE 1 TABLET(25 MG) BY MOUTH DAILY  Dispense: 45 tablet; Refill: 2 - lisinopril (ZESTRIL) 20 MG tablet; Take 1 tablet (20 mg total) by mouth 2 (two) times daily.  Dispense: 180 tablet; Refill: 1 - verapamil (CALAN-SR) 240 MG CR tablet; Take 1 tablet (240 mg total) by mouth at bedtime.  Dispense: 90 tablet; Refill: 1  6. Pure hypercholesterolemia - ezetimibe (ZETIA) 10 MG tablet; Take 1 tablet (10 mg total) by mouth daily. Needs appt.  Dispense: 90 tablet; Refill: 1   htn- uncontrolled, asymptomatic.  last 3 visits pressures in normal range.  At home seeing [id number] systolic.  Pt to see cards in 6/22.  Will hold off on changes to medications. Pt to dec salt and cholesterol in diet and  increase in exercising.  Pt to call back if still seeing numbers over 140/85 or above. Pt hasn't been taking the hctz.  Will restart this.  Pt in agreement.   hld- stable. H/o statin intolerance and on zetia. Cont meds.  gerd- stable. Cont meds.  afib- stable. cont meds.  Cont f/u with cards.  Pt having other concerns with insomnia would recommend a f/u to address since this is physical and needing to address his htn and hld.  Return in about 6 months (around 11/13/2020) for f/u htn/hld.

## 2020-05-16 ENCOUNTER — Telehealth: Payer: Self-pay | Admitting: Family Medicine

## 2020-05-16 NOTE — Telephone Encounter (Signed)
error 

## 2020-05-17 ENCOUNTER — Other Ambulatory Visit: Payer: Self-pay | Admitting: Family Medicine

## 2020-05-25 ENCOUNTER — Other Ambulatory Visit: Payer: Self-pay | Admitting: Internal Medicine

## 2020-05-26 NOTE — Telephone Encounter (Signed)
Eliquis 5mg  refill request received. Patient is 65 years old, weight-89.8kg, Crea-1.22 on 05/06/2020, Diagnosis-Afib, and last seen by Dr. Rayann Heman on 07/04/2020 and pending appt on 07/04/2020. Dose is appropriate based on dosing criteria. Will send in refill to requested pharmacy.

## 2020-05-28 ENCOUNTER — Other Ambulatory Visit: Payer: Self-pay | Admitting: Family Medicine

## 2020-05-28 ENCOUNTER — Other Ambulatory Visit: Payer: Self-pay | Admitting: Internal Medicine

## 2020-05-28 DIAGNOSIS — E7800 Pure hypercholesterolemia, unspecified: Secondary | ICD-10-CM

## 2020-06-12 ENCOUNTER — Telehealth: Payer: Self-pay | Admitting: Family Medicine

## 2020-06-12 DIAGNOSIS — M10062 Idiopathic gout, left knee: Secondary | ICD-10-CM

## 2020-06-12 NOTE — Telephone Encounter (Signed)
[facility] Dr requesting refill on Allopurinol 100 mg tablets. Take 2 tablets po daily. Pt last seen 05/14/20 for wellness. Please advise. Thank you

## 2020-06-13 NOTE — Telephone Encounter (Signed)
Yes pls send 90 with 2 refills. Thx. D.r t

## 2020-06-18 MED ORDER — ALLOPURINOL 100 MG PO TABS: 200.0000 mg | ORAL_TABLET | Freq: Every day | ORAL | 2 refills | Status: DC

## 2020-06-18 NOTE — Telephone Encounter (Signed)
Medication refills sent to pharmacy 

## 2020-06-18 NOTE — Addendum Note (Signed)
Addended by: Vicente Males on: 06/18/2020 09:43 AM   Modules accepted: Orders

## 2020-06-24 ENCOUNTER — Other Ambulatory Visit: Payer: Self-pay | Admitting: *Deleted

## 2020-06-24 DIAGNOSIS — I10 Essential (primary) hypertension: Secondary | ICD-10-CM

## 2020-07-04 ENCOUNTER — Encounter: Payer: Self-pay | Admitting: Internal Medicine

## 2020-07-04 ENCOUNTER — Ambulatory Visit: Payer: BC Managed Care – PPO | Admitting: Internal Medicine

## 2020-07-04 ENCOUNTER — Other Ambulatory Visit: Payer: Self-pay

## 2020-07-04 VITALS — BP 128/80 | HR 60 | Ht 72.0 in | Wt 197.4 lb

## 2020-07-04 DIAGNOSIS — I4819 Other persistent atrial fibrillation: Secondary | ICD-10-CM

## 2020-07-04 DIAGNOSIS — Z9989 Dependence on other enabling machines and devices: Secondary | ICD-10-CM

## 2020-07-04 DIAGNOSIS — G4733 Obstructive sleep apnea (adult) (pediatric): Secondary | ICD-10-CM

## 2020-07-04 DIAGNOSIS — I10 Essential (primary) hypertension: Secondary | ICD-10-CM

## 2020-07-04 DIAGNOSIS — D6869 Other thrombophilia: Secondary | ICD-10-CM
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
## 2020-07-04 NOTE — Patient Instructions (Addendum)
Medication Instructions:  Your physician recommends that you continue on your current medications as directed. Please refer to the Current Medication list given to you today.  Labwork: None ordered.  Testing/Procedures: None ordered.  Follow-Up: Your physician wants you to follow-up in: 07/06/20 11:45 am with Thompson Grayer, MD    Any Other Special Instructions Will Be Listed Below (If Applicable).  If you need a refill on your cardiac medications before your next appointment, please call your pharmacy.

## 2020-08-15 ENCOUNTER — Other Ambulatory Visit: Payer: Self-pay | Admitting: Family Medicine

## 2020-08-15 DIAGNOSIS — I10 Essential (primary) hypertension: Secondary | ICD-10-CM

## 2020-08-15 DIAGNOSIS — E7800 Pure hypercholesterolemia, unspecified: Secondary | ICD-10-CM

## 2020-08-18 NOTE — Telephone Encounter (Signed)
Recommend pt come in for a visit to f/u on htn and medication refills prior to my leaving 10/02/20 or pt can call back in mid October to follow up with new provider.  Will not refill Lorrin Mais, was discontinued last summer.  Dr. Lovena Le

## 2020-08-18 NOTE — Telephone Encounter (Signed)
Patient notified and will schedule follow up with Dr Lovena Le

## 2020-08-26 ENCOUNTER — Ambulatory Visit (INDEPENDENT_AMBULATORY_CARE_PROVIDER_SITE_OTHER): Payer: BC Managed Care – PPO | Admitting: Family Medicine

## 2020-08-26 ENCOUNTER — Other Ambulatory Visit: Payer: Self-pay

## 2020-08-26 ENCOUNTER — Other Ambulatory Visit: Payer: Self-pay | Admitting: Family Medicine

## 2020-08-26 DIAGNOSIS — I10 Essential (primary) hypertension: Secondary | ICD-10-CM

## 2020-08-26 DIAGNOSIS — K219 Gastro-esophageal reflux disease without esophagitis: Secondary | ICD-10-CM

## 2020-08-26 MED ORDER — HYDROCHLOROTHIAZIDE 25 MG PO TABS: 25.0000 mg | ORAL_TABLET | Freq: Every day | ORAL | 1 refills | Status: DC

## 2020-08-26 MED ORDER — VERAPAMIL HCL ER 240 MG PO TBCR: 240.0000 mg | EXTENDED_RELEASE_TABLET | Freq: Every day | ORAL | 1 refills | Status: DC

## 2020-08-26 MED ORDER — LISINOPRIL 20 MG PO TABS: 20.0000 mg | ORAL_TABLET | Freq: Two times a day (BID) | ORAL | 1 refills | Status: DC

## 2020-08-26 MED ORDER — PANTOPRAZOLE SODIUM 40 MG PO TBEC: 40.0000 mg | DELAYED_RELEASE_TABLET | Freq: Every day | ORAL | 0 refills | Status: DC

## 2020-08-26 NOTE — Progress Notes (Signed)
Patient ID: Anthony [name], male    DOB: 1955-09-29, 65 y.o.   MRN: [id number]   Chief Complaint  Patient presents with   Hypertension   Gastroesophageal Reflux   Subjective:    HPI Pt doing well and saw cardiology in 6/22 all going well.  Has h/o Afib.  HLD- doing well no new concerns.  Statin intolerance, taking zetia. No chest pain, palpitations, myalgias or joint pains. Not eating as many eggs and bacon now. Had tried statins in the past zocor and lipids- caused myalgia and bodyaches.  H/o afib and htn- Has low HR occ seeing 50s and other times 70s. Taking metoprolol.  HTN Pt compliant with BP meds.  No SEs Denies chest pain, sob, LE swelling, or blurry vision.   Small skin lesion- occ itching on arms. Has seen them derm in past.   Medical History Quamel has a past medical history of Anxiety, Chronic back pain, GERD (gastroesophageal reflux disease), Gout, Headache, History of kidney stones, Hyperlipidemia, Hypertension, Insomnia, Migraine headache, OSA on CPAP, Persistent atrial fibrillation ([place]), Scoliosis, Sinusitis, and Venous stasis.   Outpatient Encounter Medications as of 08/26/2020  Medication Sig   allopurinol (ZYLOPRIM) 300 MG tablet Take 300 mg by mouth daily.   colchicine 0.6 MG tablet Take 1-2 tablets (0.6-1.2 mg total) by mouth See admin instructions. Take 1.'2mg'$  in the morning and 0.'6mg'$  in the evening as needed for gout flare.   ELIQUIS 5 MG TABS tablet TAKE 1 TABLET BY MOUTH TWICE A DAY   EPINEPHrine (AUVI-Q) 0.3 mg/0.3 mL IJ SOAJ injection Inject 0.3 mLs (0.3 mg total) into the muscle as needed for anaphylaxis. Use as directed for severe allergic reaction   ezetimibe (ZETIA) 10 MG tablet TAKE 1 TABLET BY MOUTH  DAILY   loratadine (CLARITIN) 10 MG tablet Take 10 mg by mouth daily.   metoprolol succinate (TOPROL-XL) 25 MG 24 hr tablet Take 1 tablet (25 mg total) by mouth 2 (two) times daily. Please keep upcoming appt in June 2022 with Dr. Rayann Heman before  anymore refills. Thank you   Multiple Vitamin (MULTIVITAMIN) tablet Take 1 tablet by mouth daily.   SUMAtriptan (IMITREX) 50 MG tablet Take 1 tablet at first sign of headache, may take 2 tablets 2 hours later if needed (Patient taking differently: Take 50 mg by mouth every 2 (two) hours as needed for migraine or headache. Take 1 tablet at first sign of headache, may take 2 tablets 2 hours later if needed)   vitamin B-12 (CYANOCOBALAMIN) 1000 MCG tablet Take 1,000 mcg by mouth daily.   [DISCONTINUED] hydrochlorothiazide (HYDRODIURIL) 25 MG tablet TAKE 1 TABLET BY MOUTH  DAILY   [DISCONTINUED] lisinopril (ZESTRIL) 20 MG tablet TAKE 1 TABLET BY MOUTH  TWICE DAILY   [DISCONTINUED] pantoprazole (PROTONIX) 40 MG tablet Take 1 tablet (40 mg total) by mouth daily.   [DISCONTINUED] verapamil (CALAN-SR) 240 MG CR tablet TAKE 1 TABLET BY MOUTH AT  BEDTIME   albuterol (VENTOLIN HFA) 108 (90 Base) MCG/ACT inhaler Inhale 2 puffs into the lungs every 6 (six) hours as needed for wheezing or shortness of breath. INHALE 2 PUFFS BY MOUTH FOUR TIMES DAILY AS NEEDED FOR WHEEZING (Patient not taking: Reported on 08/26/2020)   hydrochlorothiazide (HYDRODIURIL) 25 MG tablet Take 1 tablet (25 mg total) by mouth daily.   lisinopril (ZESTRIL) 20 MG tablet Take 1 tablet (20 mg total) by mouth 2 (two) times daily.   [START ON 11/26/2020] pantoprazole (PROTONIX) 40 MG tablet Take 1 tablet (40  mg total) by mouth daily.   verapamil (CALAN-SR) 240 MG CR tablet Take 1 tablet (240 mg total) by mouth at bedtime.   No facility-administered encounter medications on file as of 08/26/2020.     Review of Systems  Constitutional:  Negative for chills and fever.  HENT:  Negative for congestion, rhinorrhea and sore throat.   Respiratory:  Negative for cough, shortness of breath and wheezing.   Cardiovascular:  Negative for chest pain and leg swelling.  Gastrointestinal:  Negative for abdominal pain, diarrhea, nausea and vomiting.   Genitourinary:  Negative for dysuria and frequency.  Skin:  Negative for rash.  Neurological:  Negative for dizziness, weakness and headaches.    Vitals BP 132/75   Pulse (!) 50   Temp (!) 97.4 F (36.3 C)   Ht 6' (1.829 m)   Wt 199 lb (90.3 kg)   SpO2 99%   BMI 26.99 kg/m   Objective:   Physical Exam Vitals and nursing note reviewed.  Constitutional:      General: He is not in acute distress.    Appearance: Normal appearance. He is not ill-appearing.  HENT:     Head: Normocephalic.     Nose: Nose normal. No congestion.     Mouth/Throat:     Mouth: Mucous membranes are moist.     Pharynx: No oropharyngeal exudate.  Eyes:     Extraocular Movements: Extraocular movements intact.     Conjunctiva/sclera: Conjunctivae normal.     Pupils: Pupils are equal, round, and reactive to light.  Cardiovascular:     Rate and Rhythm: Regular rhythm. Bradycardia present.     Pulses: Normal pulses.     Heart sounds: Normal heart sounds. No murmur heard. Pulmonary:     Effort: Pulmonary effort is normal.     Breath sounds: Normal breath sounds. No wheezing, rhonchi or rales.  Musculoskeletal:        General: Normal range of motion.     Right lower leg: No edema.     Left lower leg: No edema.  Skin:    General: Skin is warm and dry.     Findings: No rash.  Neurological:     General: No focal deficit present.     Mental Status: He is alert and oriented to person, place, and time.     Cranial Nerves: No cranial nerve deficit.  Psychiatric:        Mood and Affect: Mood normal.        Behavior: Behavior normal.        Thought Content: Thought content normal.        Judgment: Judgment normal.     Assessment and Plan   1. Essential hypertension, benign - hydrochlorothiazide (HYDRODIURIL) 25 MG tablet; Take 1 tablet (25 mg total) by mouth daily.  Dispense: 90 tablet; Refill: 1 - lisinopril (ZESTRIL) 20 MG tablet; Take 1 tablet (20 mg total) by mouth 2 (two) times daily.   Dispense: 180 tablet; Refill: 1 - verapamil (CALAN-SR) 240 MG CR tablet; Take 1 tablet (240 mg total) by mouth at bedtime.  Dispense: 90 tablet; Refill: 1  2. Gastroesophageal reflux disease, unspecified whether esophagitis present - pantoprazole (PROTONIX) 40 MG tablet; Take 1 tablet (40 mg total) by mouth daily.  Dispense: 90 tablet; Refill: 0   Skin lesion -arm- f/u derm.    Htn- stable. Cont meds.  Gerd- stable. Cont meds.  Gout- stable. Cont allopurinol.  Hld- stable. Cont zetia.   Will need labs rechecked  on next visit.  Return in about 6 months (around 02/26/2021) for f/u afib, htn, gout.

## 2020-11-09 ENCOUNTER — Other Ambulatory Visit: Payer: Self-pay | Admitting: Family Medicine

## 2020-11-09 DIAGNOSIS — E7800 Pure hypercholesterolemia, unspecified: Secondary | ICD-10-CM

## 2020-12-03 ENCOUNTER — Other Ambulatory Visit: Payer: Self-pay | Admitting: Internal Medicine

## 2020-12-03 NOTE — Telephone Encounter (Signed)
Prescription refill request for Eliquis received. Indication: Afib  Last office visit:07/04/20 ([facility])  Scr: 1.22 (05/06/20)  Age: 65 Weight: 90.3kg  Appropriate dose and refill sent to requested pharmacy.

## 2020-12-08 DIAGNOSIS — M109 Gout, unspecified: Secondary | ICD-10-CM | POA: Diagnosis not present

## 2020-12-08 DIAGNOSIS — M1712 Unilateral primary osteoarthritis, left knee: Secondary | ICD-10-CM | POA: Diagnosis not present

## 2020-12-13 ENCOUNTER — Other Ambulatory Visit: Payer: Self-pay | Admitting: Internal Medicine

## 2020-12-17 ENCOUNTER — Telehealth: Payer: Self-pay | Admitting: Family Medicine

## 2020-12-22 NOTE — Telephone Encounter (Signed)
Error please close made appointment

## 2020-12-24 ENCOUNTER — Ambulatory Visit (INDEPENDENT_AMBULATORY_CARE_PROVIDER_SITE_OTHER): Payer: BC Managed Care – PPO | Admitting: Family Medicine

## 2020-12-24 ENCOUNTER — Other Ambulatory Visit: Payer: Self-pay

## 2020-12-24 VITALS — BP 133/76 | HR 68 | Ht 72.0 in | Wt 202.6 lb

## 2020-12-24 DIAGNOSIS — R6882 Decreased libido: Secondary | ICD-10-CM | POA: Diagnosis not present

## 2020-12-24 DIAGNOSIS — M1A9XX0 Chronic gout, unspecified, without tophus (tophi): Secondary | ICD-10-CM

## 2020-12-24 DIAGNOSIS — N529 Male erectile dysfunction, unspecified: Secondary | ICD-10-CM | POA: Diagnosis not present

## 2020-12-24 DIAGNOSIS — I4819 Other persistent atrial fibrillation: Secondary | ICD-10-CM

## 2020-12-24 DIAGNOSIS — E7800 Pure hypercholesterolemia, unspecified: Secondary | ICD-10-CM

## 2020-12-24 DIAGNOSIS — M109 Gout, unspecified: Secondary | ICD-10-CM | POA: Insufficient documentation

## 2020-12-24 DIAGNOSIS — I10 Essential (primary) hypertension: Secondary | ICD-10-CM

## 2020-12-24 MED ORDER — ROSUVASTATIN CALCIUM 5 MG PO TABS: 5.0000 mg | ORAL_TABLET | Freq: Every day | ORAL | 3 refills | Status: DC

## 2020-12-24 MED ORDER — TADALAFIL 10 MG PO TABS: 10.0000 mg | ORAL_TABLET | Freq: Every day | ORAL | 1 refills | Status: DC | PRN

## 2020-12-24 NOTE — Assessment & Plan Note (Signed)
Lab ordered for testosterone. Trial of Cialis.

## 2020-12-24 NOTE — Assessment & Plan Note (Signed)
Stable.  Continue Eliquis, metoprolol, verapamil.

## 2020-12-24 NOTE — Assessment & Plan Note (Signed)
AM testosterone to assess for hypogonadism.

## 2020-12-24 NOTE — Assessment & Plan Note (Signed)
Well-controlled and at goal.  Continue hydrochlorothiazide, lisinopril, metoprolol, verapamil.

## 2020-12-24 NOTE — Assessment & Plan Note (Signed)
Stable.  Continue current dose of allopurinol.

## 2020-12-24 NOTE — Progress Notes (Signed)
Subjective:  Patient ID: Anthony [name], male    DOB: 03/27/1955  Age: 65 y.o. MRN: [id number]  CC: Chief Complaint  Patient presents with   Establish Care    Refill on medications    HPI:  65 year old male with hypertension, atrial fibrillation, OSA, GERD, hyperlipidemia, gout presents for follow-up.  Low libido, ED Patient reports ongoing decreased libido.  He states that he has some drive but it is diminished considerably. He states that he also has difficulty getting and maintaining an erection. He would like to discuss interventions/work-up today.  Atrial fibrillation Stable on metoprolol, verapamil, and Eliquis.  Hypertension BP well controlled at this time.  He is currently on metoprolol, verapamil, lisinopril, and HCTZ.  No reported adverse side effects.  Hyperlipidemia Uncontrolled.  Patient has failed to statins in the past.  He is currently on Zetia. Discussed treatment options today to help lower his risk.  Patient is amenable to trying statin again.  Advised to continue Zetia.  Gout Stable.  No recent flares.  Patient Active Problem List   Diagnosis Date Noted   Gout 12/24/2020   Low libido 12/24/2020   Erectile dysfunction 12/24/2020   Anaphylactic shock due to adverse food reaction 06/02/2017   OSA on CPAP    Insomnia    Hyperlipidemia    History of kidney stones    GERD (gastroesophageal reflux disease)    Anxiety    Persistent atrial fibrillation ([place])    Depression 07/02/2013   Essential hypertension, benign 06/02/2012    Social Hx   Social History   Socioeconomic History   Marital status: Married    Spouse name: Not on file   Number of children: Not on file   Years of education: Not on file   Highest education level: Not on file  Occupational History   Not on file  Tobacco Use   Smoking status: Never   Smokeless tobacco: Never  Vaping Use   Vaping Use: Never used  Substance and Sexual Activity   Alcohol use: No   Drug use: No    Sexual activity: Yes  Other Topics Concern   Not on file  Social History Narrative   Lives in [place]   Works at [employer] and [employer]   Married   Social Determinants of Health   Financial Resource Strain: Not on file  Food Insecurity: Not on file  Transportation Needs: Not on file  Physical Activity: Not on file  Stress: Not on file  Social Connections: Not on file    Review of Systems  Constitutional: Negative.   Genitourinary:        Low libido. Erectile dysfunction.   Objective:  BP 133/76   Pulse 68   Ht 6' (1.829 m)   Wt 202 lb 9.6 oz (91.9 kg)   SpO2 98%   BMI 27.48 kg/m   BP/Weight 12/24/2020 08/26/2020 5/46/5035  Systolic BP 465 681 275  Diastolic BP 76 75 80  Wt. (Lbs) 202.6 199 197.4  BMI 27.48 26.99 26.77    Physical Exam Vitals and nursing note reviewed.  Constitutional:      General: He is not in acute distress.    Appearance: Normal appearance. He is not ill-appearing.  HENT:     Head: Normocephalic and atraumatic.  Eyes:     General:        Right eye: No discharge.        Left eye: No discharge.     Conjunctiva/sclera: Conjunctivae normal.  Cardiovascular:  Rate and Rhythm: Normal rate and regular rhythm.  Pulmonary:     Effort: Pulmonary effort is normal.     Breath sounds: Normal breath sounds. No wheezing, rhonchi or rales.  Neurological:     Mental Status: He is alert.  Psychiatric:        Mood and Affect: Mood normal.        Behavior: Behavior normal.    Lab Results  Component Value Date   WBC 6.2 05/06/2020   HGB 15.4 05/06/2020   HCT 45.2 05/06/2020   PLT 184 05/06/2020   GLUCOSE 98 05/06/2020   CHOL 205 (H) 05/06/2020   TRIG 81 05/06/2020   HDL 37 (L) 05/06/2020   LDLCALC 153 (H) 05/06/2020   ALT 25 05/06/2020   AST 20 05/06/2020   NA 147 (H) 05/06/2020   K 4.4 05/06/2020   CL 104 05/06/2020   CREATININE 1.22 05/06/2020   BUN 12 05/06/2020   CO2 24 05/06/2020   TSH 2.720 04/08/2016   PSA 0.7 03/29/2014      Assessment & Plan:   Problem List Items Addressed This Visit       Cardiovascular and Mediastinum   Essential hypertension, benign    Well-controlled and at goal.  Continue hydrochlorothiazide, lisinopril, metoprolol, verapamil.      Relevant Medications   tadalafil (CIALIS) 10 MG tablet   rosuvastatin (CRESTOR) 5 MG tablet   Persistent atrial fibrillation ([facility])    Stable.  Continue Eliquis, metoprolol, verapamil.      Relevant Medications   tadalafil (CIALIS) 10 MG tablet   rosuvastatin (CRESTOR) 5 MG tablet     Other   Hyperlipidemia    Starting low-dose Crestor.  Continue Zetia.  Recheck lipids in a month.      Relevant Medications   tadalafil (CIALIS) 10 MG tablet   rosuvastatin (CRESTOR) 5 MG tablet   Other Relevant Orders   Lipid panel   Gout    Stable.  Continue current dose of allopurinol.      Low libido - Primary    AM testosterone to assess for hypogonadism.      Relevant Orders   Testosterone,Free and Total   Erectile dysfunction    Lab ordered for testosterone. Trial of Cialis.       Meds ordered this encounter  Medications   tadalafil (CIALIS) 10 MG tablet    Sig: Take 1 tablet (10 mg total) by mouth daily as needed for erectile dysfunction.    Dispense:  10 tablet    Refill:  1   rosuvastatin (CRESTOR) 5 MG tablet    Sig: Take 1 tablet (5 mg total) by mouth daily.    Dispense:  90 tablet    Refill:  3    Follow-up:  Return for 3-6 months.  [place]

## 2020-12-24 NOTE — Patient Instructions (Signed)
Labs in the am (after 1 month on the Crestor).  Medication as directed.  Follow up in 3-6 months.  Take care  Dr. Lacinda Axon

## 2020-12-24 NOTE — Assessment & Plan Note (Signed)
Starting low-dose Crestor.  Continue Zetia.  Recheck lipids in a month.

## 2020-12-26 ENCOUNTER — Other Ambulatory Visit: Payer: Self-pay | Admitting: Family Medicine

## 2020-12-26 DIAGNOSIS — K219 Gastro-esophageal reflux disease without esophagitis: Secondary | ICD-10-CM

## 2020-12-28 IMAGING — DX DG CHEST 2V
2 series · 2 of 2 positions shown · non-contrast
Comparison: Chest radiograph dated 04/10/2016

CLINICAL DATA: 62-year-old male with cough and fever.

EXAM:
CHEST - 2 VIEW

[chest pa]
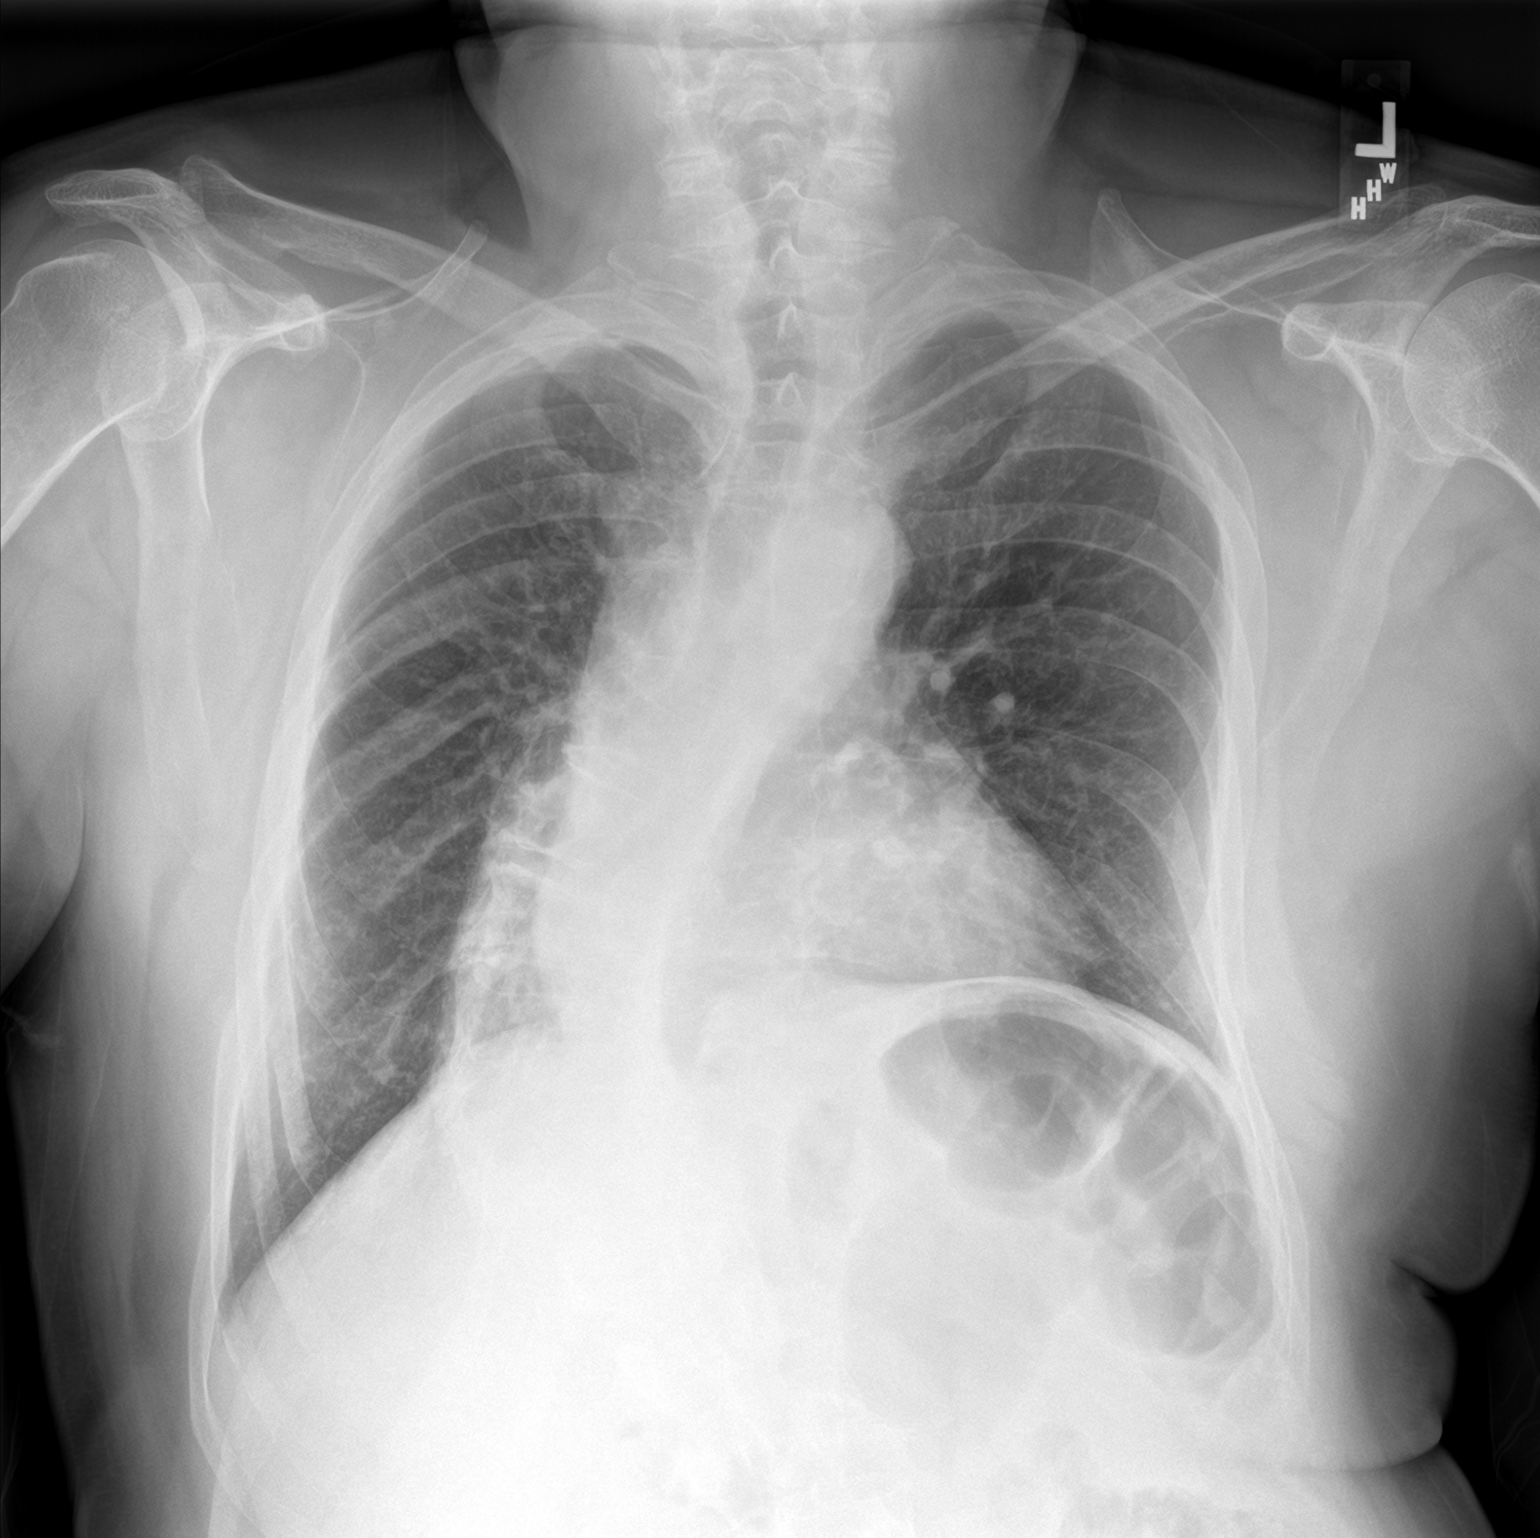

[chest lat]
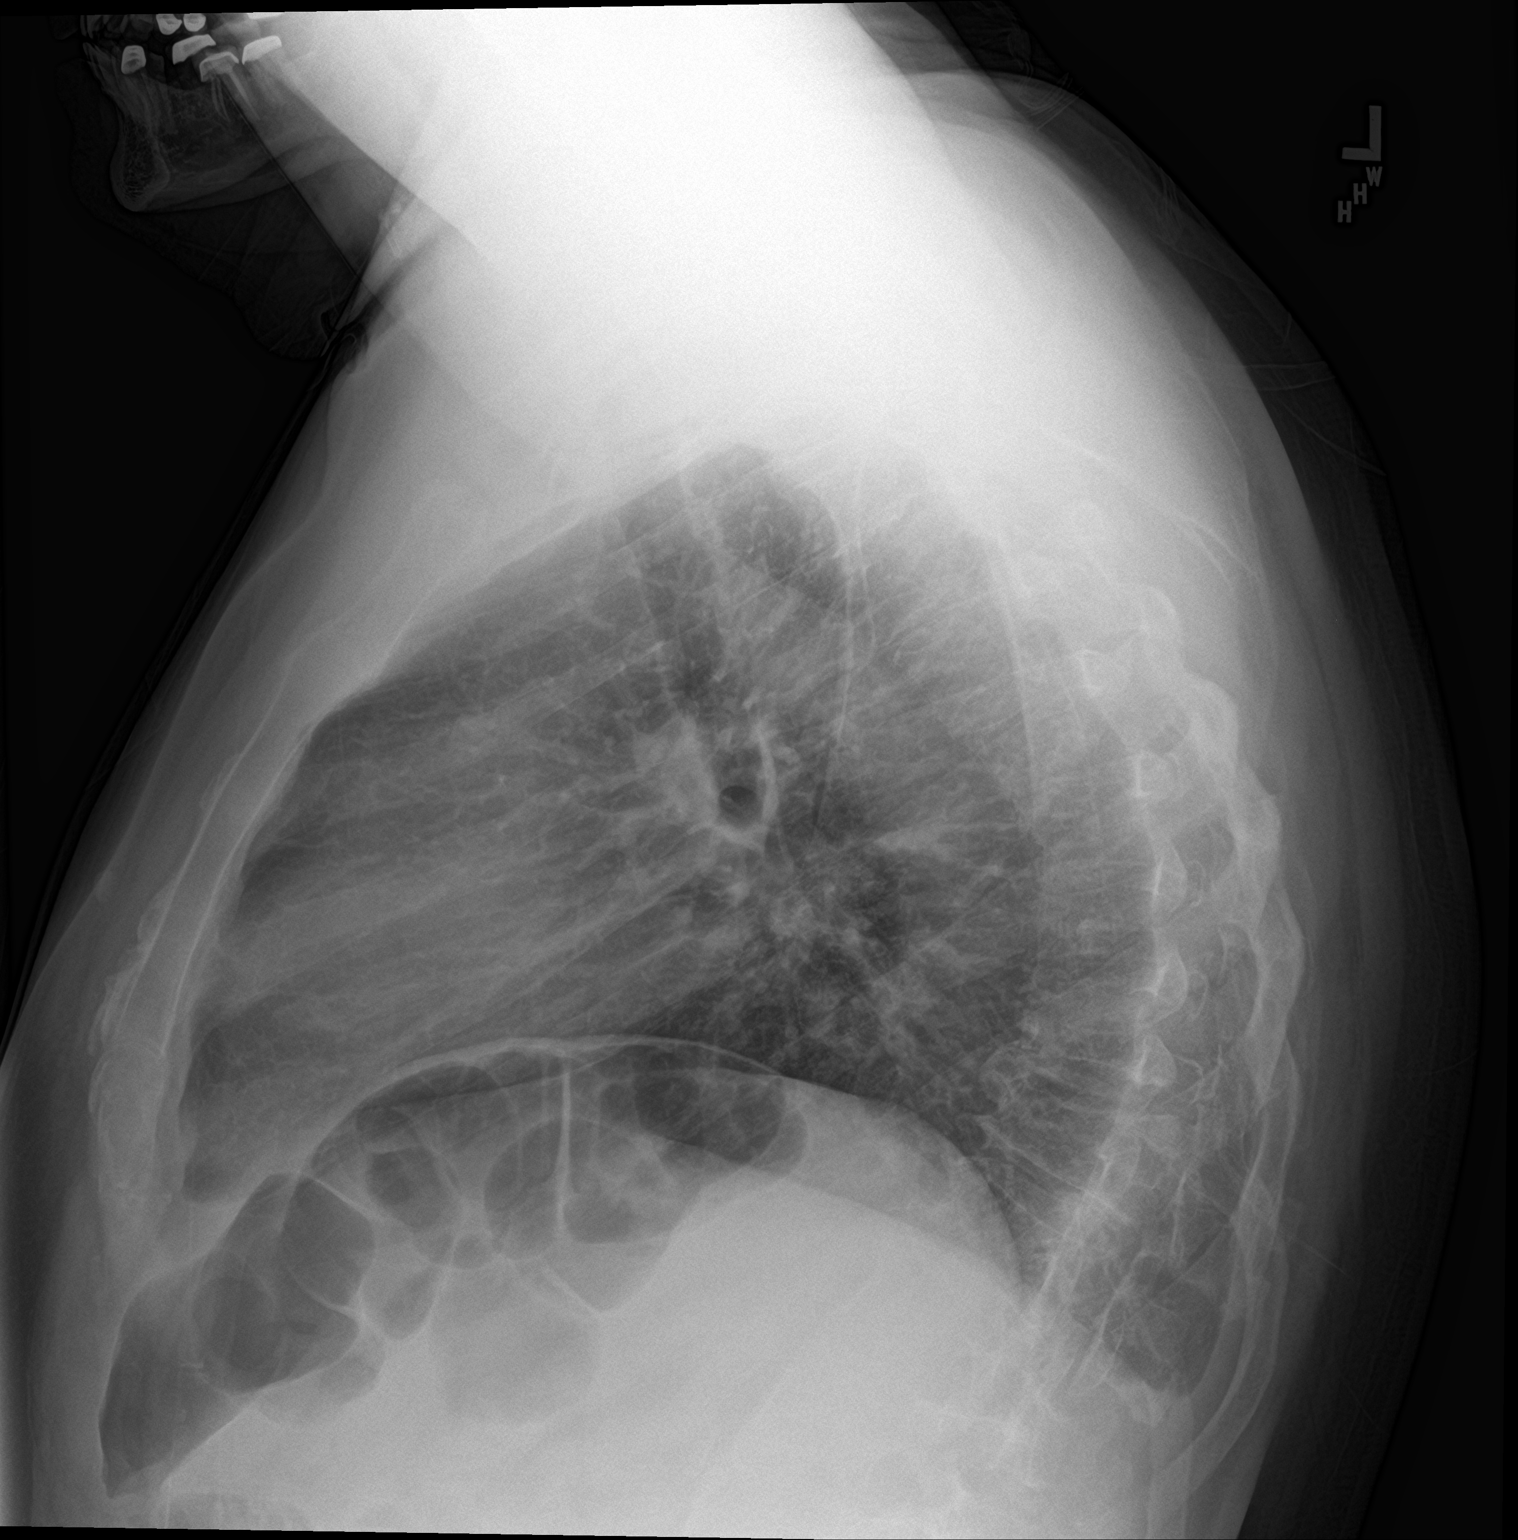

[2 of 2 positions shown; findings below may reference images not displayed]

FINDINGS: There is no focal consolidation, pleural effusion, or pneumothorax.
The cardiac silhouette is within normal limits. There is scoliosis.
No acute osseous pathology.
IMPRESSION: No active cardiopulmonary disease.

## 2021-01-05 ENCOUNTER — Other Ambulatory Visit: Payer: Self-pay | Admitting: Family Medicine

## 2021-01-05 DIAGNOSIS — I10 Essential (primary) hypertension: Secondary | ICD-10-CM

## 2021-01-05 DIAGNOSIS — M10062 Idiopathic gout, left knee: Secondary | ICD-10-CM

## 2021-01-29 LAB — LIPID PANEL
Chol/HDL Ratio: 3.1 ratio (ref 0.0–5.0)
Cholesterol, Total: 114 mg/dL (ref 100–199)
HDL: 37 mg/dL — ABNORMAL LOW (ref >39)
LDL Chol Calc (NIH): 62 mg/dL (ref 0–99)
Triglycerides: 74 mg/dL (ref 0–149)
VLDL Cholesterol Cal: 15 mg/dL (ref 5–40)

## 2021-01-29 LAB — TESTOSTERONE,FREE AND TOTAL
Testosterone, Free: 5.2 pg/mL — ABNORMAL LOW (ref 6.6–18.1)
Testosterone: 457 ng/dL (ref 264–916)

## 2021-03-16 ENCOUNTER — Other Ambulatory Visit: Payer: Self-pay | Admitting: Family Medicine

## 2021-03-16 DIAGNOSIS — K219 Gastro-esophageal reflux disease without esophagitis: Secondary | ICD-10-CM

## 2021-04-16 ENCOUNTER — Encounter: Payer: Self-pay | Admitting: Family Medicine

## 2021-04-20 ENCOUNTER — Ambulatory Visit (INDEPENDENT_AMBULATORY_CARE_PROVIDER_SITE_OTHER): Payer: Medicare Other | Admitting: Family Medicine

## 2021-04-20 VITALS — BP 135/73 | HR 55 | Temp 98.7°F | Ht 72.0 in | Wt 203.0 lb

## 2021-04-20 DIAGNOSIS — R6889 Other general symptoms and signs: Secondary | ICD-10-CM | POA: Diagnosis not present

## 2021-04-20 DIAGNOSIS — G2581 Restless legs syndrome: Secondary | ICD-10-CM

## 2021-04-20 NOTE — Patient Instructions (Signed)
Keep a close eye. ? ?Let me know if you have continued difficulty. ? ?Follow up in 6 months. ? ?Take care ? ?Dr. Lacinda Axon  ?

## 2021-04-21 DIAGNOSIS — R6889 Other general symptoms and signs: Secondary | ICD-10-CM | POA: Insufficient documentation

## 2021-04-21 DIAGNOSIS — G2581 Restless legs syndrome: Secondary | ICD-10-CM | POA: Insufficient documentation

## 2021-04-21 NOTE — Assessment & Plan Note (Signed)
Patient's history consistent with restless leg.  Patient states that it is not that troublesome at this time.  He would like to avoid medication for now.  He will continue to monitor. ?

## 2021-04-21 NOTE — Assessment & Plan Note (Signed)
Patient does not appear to have any significant cognitive dysfunction.  Advised to let me know if things worsen. ?

## 2021-04-21 NOTE — Progress Notes (Signed)
? ?Subjective:  ?Patient ID: Anthony [name], male    DOB: 10-11-1955  Age: 66 y.o. MRN: [id number] ? ?CC: ?Chief Complaint  ?Patient presents with  ? memory issues  ?  Fogetfullness- short term  ?Less observant   ? ? ?HPI: ? ?66 year old male presents for evaluation of the above. ? ?Patient reports that over the past 2 months he seems to be more forgetful.  Often goes into a room and forgets what he is going and therefore.  He still able to manage his finances and does not seem to have any issues with long-term memory.  He states that his brother has dementia.  This is slightly troublesome for him. ? ?Additionally, patient states that he is suffering from restless leg.  At night once he goes to bed, he feels that his legs are restless.  Worse after a long day of physical activity.  He states that his wife wanted him to bring this up at his appointment as well. ? ?Patient Active Problem List  ? Diagnosis Date Noted  ? Forgetfulness 04/21/2021  ? RLS (restless legs syndrome) 04/21/2021  ? Gout 12/24/2020  ? Low libido 12/24/2020  ? Erectile dysfunction 12/24/2020  ? Anaphylactic shock due to adverse food reaction 06/02/2017  ? OSA on CPAP   ? Insomnia   ? Hyperlipidemia   ? History of kidney stones   ? GERD (gastroesophageal reflux disease)   ? Anxiety   ? Persistent atrial fibrillation ([place])   ? Depression 07/02/2013  ? Essential hypertension, benign 06/02/2012  ? ? ?Social Hx   ?Social History  ? ?Socioeconomic History  ? Marital status: Married  ?  Spouse name: Not on file  ? Number of children: Not on file  ? Years of education: Not on file  ? Highest education level: Not on file  ?Occupational History  ? Not on file  ?Tobacco Use  ? Smoking status: Never  ? Smokeless tobacco: Never  ?Vaping Use  ? Vaping Use: Never used  ?Substance and Sexual Activity  ? Alcohol use: No  ? Drug use: No  ? Sexual activity: Yes  ?Other Topics Concern  ? Not on file  ?Social History Narrative  ? Lives in [place]  ? Works at  [employer] and [employer]  ? Married  ? ?Social Determinants of Health  ? ?Financial Resource Strain: Not on file  ?Food Insecurity: Not on file  ?Transportation Needs: Not on file  ?Physical Activity: Not on file  ?Stress: Not on file  ?Social Connections: Not on file  ? ? ?Review of Systems ?Per HPI ? ?Objective:  ?BP 135/73   Pulse (!) 55   Temp 98.7 ?F (37.1 ?C)   Ht 6' (1.829 m)   Wt 203 lb (92.1 kg)   SpO2 97%   BMI 27.53 kg/m?  ? ? ?  04/20/2021  ?  2:00 PM 12/24/2020  ? 10:31 AM 08/26/2020  ?  9:39 AM  ?BP/Weight  ?Systolic BP 354 656 812  ?Diastolic BP 73 76 75  ?Wt. (Lbs) 203 202.6 199  ?BMI 27.53 kg/m2 27.48 kg/m2 26.99 kg/m2  ? ? ?Physical Exam ?Vitals and nursing note reviewed.  ?Constitutional:   ?   General: He is not in acute distress. ?   Appearance: Normal appearance. He is not ill-appearing.  ?Eyes:  ?   General:     ?   Right eye: No discharge.     ?   Left eye: No discharge.  ?  Conjunctiva/sclera: Conjunctivae normal.  ?Cardiovascular:  ?   Rate and Rhythm: Normal rate and regular rhythm.  ?Pulmonary:  ?   Effort: Pulmonary effort is normal.  ?   Breath sounds: Normal breath sounds.  ?Neurological:  ?   General: No focal deficit present.  ?   Mental Status: He is alert.  ?Psychiatric:     ?   Mood and Affect: Mood normal.     ?   Behavior: Behavior normal.  ? ? ?Lab Results  ?Component Value Date  ? WBC 6.2 05/06/2020  ? HGB 15.4 05/06/2020  ? HCT 45.2 05/06/2020  ? PLT 184 05/06/2020  ? GLUCOSE 98 05/06/2020  ? CHOL 114 01/26/2021  ? TRIG 74 01/26/2021  ? HDL 37 (L) 01/26/2021  ? [place] 62 01/26/2021  ? ALT 25 05/06/2020  ? AST 20 05/06/2020  ? NA 147 (H) 05/06/2020  ? K 4.4 05/06/2020  ? CL 104 05/06/2020  ? CREATININE 1.22 05/06/2020  ? BUN 12 05/06/2020  ? CO2 24 05/06/2020  ? TSH 2.720 04/08/2016  ? PSA 0.7 03/29/2014  ? ? ? ?Assessment & Plan:  ? ?Problem List Items Addressed This Visit   ? ?  ? Other  ? Forgetfulness - Primary  ?  Patient does not appear to have any significant cognitive  dysfunction.  Advised to let me know if things worsen. ?  ?  ? RLS (restless legs syndrome)  ?  Patient's history consistent with restless leg.  Patient states that it is not that troublesome at this time.  He would like to avoid medication for now.  He will continue to monitor. ?  ?  ? ?Follow-up:  Return in about 6 months (around 10/20/2021). ? ?Thersa Salt DO ?[place] ? ?

## 2021-05-17 ENCOUNTER — Other Ambulatory Visit: Payer: Self-pay | Admitting: Family Medicine

## 2021-05-17 DIAGNOSIS — I10 Essential (primary) hypertension: Secondary | ICD-10-CM

## 2021-06-06 ENCOUNTER — Other Ambulatory Visit: Payer: Self-pay | Admitting: Family Medicine

## 2021-06-06 DIAGNOSIS — I10 Essential (primary) hypertension: Secondary | ICD-10-CM

## 2021-06-09 ENCOUNTER — Other Ambulatory Visit: Payer: Self-pay | Admitting: Family Medicine

## 2021-06-09 DIAGNOSIS — K219 Gastro-esophageal reflux disease without esophagitis: Secondary | ICD-10-CM

## 2021-06-16 ENCOUNTER — Other Ambulatory Visit: Payer: Self-pay | Admitting: Family Medicine

## 2021-06-16 DIAGNOSIS — I10 Essential (primary) hypertension: Secondary | ICD-10-CM

## 2021-06-24 ENCOUNTER — Ambulatory Visit: Payer: BC Managed Care – PPO | Admitting: Family Medicine

## 2021-06-29 ENCOUNTER — Encounter: Payer: Self-pay | Admitting: Family Medicine

## 2021-06-29 ENCOUNTER — Ambulatory Visit (INDEPENDENT_AMBULATORY_CARE_PROVIDER_SITE_OTHER): Payer: Medicare Other | Admitting: Family Medicine

## 2021-06-29 VITALS — BP 124/72 | HR 53 | Temp 97.5°F | Wt 202.2 lb

## 2021-06-29 DIAGNOSIS — I10 Essential (primary) hypertension: Secondary | ICD-10-CM

## 2021-06-29 DIAGNOSIS — Z130 Encounter for screening for diseases of the blood and blood-forming organs and certain disorders involving the immune mechanism: Secondary | ICD-10-CM

## 2021-06-29 DIAGNOSIS — Z23 Encounter for immunization: Secondary | ICD-10-CM

## 2021-06-29 DIAGNOSIS — Z7901 Long term (current) use of anticoagulants: Secondary | ICD-10-CM | POA: Diagnosis not present

## 2021-06-29 DIAGNOSIS — N529 Male erectile dysfunction, unspecified: Secondary | ICD-10-CM

## 2021-06-29 DIAGNOSIS — Z125 Encounter for screening for malignant neoplasm of prostate: Secondary | ICD-10-CM

## 2021-06-29 DIAGNOSIS — E7800 Pure hypercholesterolemia, unspecified: Secondary | ICD-10-CM

## 2021-06-29 MED ORDER — TADALAFIL 10 MG PO TABS: 10.0000 mg | ORAL_TABLET | Freq: Every day | ORAL | 5 refills | Status: DC | PRN

## 2021-06-29 NOTE — Patient Instructions (Signed)
I have ordered your labs.  You can do them whenever you like.  Pneumonia vaccine given today.  You can get your shingles vaccine at your pharmacy.  Follow up in 6 months.  Call or message with concerns.

## 2021-06-29 NOTE — Assessment & Plan Note (Signed)
Stable.  Continue current medication

## 2021-06-29 NOTE — Assessment & Plan Note (Signed)
Well-controlled.  Labs ordered.  Continue Crestor and Zetia.

## 2021-06-29 NOTE — Assessment & Plan Note (Signed)
Stable.  Refilled tadalafil.

## 2021-06-29 NOTE — Progress Notes (Signed)
Subjective:  Patient ID: Anthony [name], male    DOB: [birth year]  Age: 66 y.o. MRN: [id number]  CC: Chief Complaint  Patient presents with   Hypertension    Pt following up on HTN. Pt states doing well. Pt went to [place] last week and woke up one morning around 4; headache did resolve but pt states he doesn't get headaches.    HPI:  66 year old male with hypertension, A-fib, OSA, GERD, anxiety and depression, erectile dysfunction, hyperlipidemia, gout presents for follow-up.  Patient reports that he is doing well.  He has no particular complaints or concerns at this time.  Patient's blood pressure is well controlled on verapamil and lisinopril.  Patient's lipids have been at goal.  Needs labs.  He is currently on Zetia and Crestor.  Patient reports that he needs a refill on his tadalafil.  Patient is due for pneumococcal vaccine.  We will discuss this today.  Patient Active Problem List   Diagnosis Date Noted   Forgetfulness 04/21/2021   RLS (restless legs syndrome) 04/21/2021   Gout 12/24/2020   Low libido 12/24/2020   Erectile dysfunction 12/24/2020   Anaphylactic shock due to adverse food reaction 06/02/2017   OSA on CPAP    Hyperlipidemia    History of kidney stones    GERD (gastroesophageal reflux disease)    Anxiety    Persistent atrial fibrillation ([place])    Depression 07/02/2013   Essential hypertension, benign 06/02/2012    Social Hx   Social History   Socioeconomic History   Marital status: Married    Spouse name: Not on file   Number of children: Not on file   Years of education: Not on file   Highest education level: Not on file  Occupational History   Not on file  Tobacco Use   Smoking status: Never   Smokeless tobacco: Never  Vaping Use   Vaping Use: Never used  Substance and Sexual Activity   Alcohol use: No   Drug use: No   Sexual activity: Yes  Other Topics Concern   Not on file  Social History Narrative   Lives in [place]    Works at [employer] and [employer]   Married   Social Determinants of Health   Financial Resource Strain: Not on file  Food Insecurity: Not on file  Transportation Needs: Not on file  Physical Activity: Not on file  Stress: Not on file  Social Connections: Not on file    Review of Systems  Constitutional: Negative.   Respiratory: Negative.    Cardiovascular: Negative.    Objective:  BP 124/72   Pulse (!) 53   Temp (!) 97.5 F (36.4 C)   Wt 202 lb 3.2 oz (91.7 kg)   SpO2 98%   BMI 27.42 kg/m      06/29/2021    8:20 AM 04/20/2021    2:00 PM 12/24/2020   10:31 AM  BP/Weight  Systolic BP 355 974 163  Diastolic BP 72 73 76  Wt. (Lbs) 202.2 203 202.6  BMI 27.42 kg/m2 27.53 kg/m2 27.48 kg/m2    Physical Exam Vitals and nursing note reviewed.  Constitutional:      General: He is not in acute distress.    Appearance: Normal appearance.  HENT:     Head: Normocephalic and atraumatic.     Mouth/Throat:     Pharynx: Oropharynx is clear.  Eyes:     General:        Right eye: No discharge.  Left eye: No discharge.     Conjunctiva/sclera: Conjunctivae normal.     Pupils: Pupils are equal, round, and reactive to light.  Cardiovascular:     Rate and Rhythm: Regular rhythm. Bradycardia present.  Pulmonary:     Effort: Pulmonary effort is normal.     Breath sounds: Normal breath sounds. No wheezing, rhonchi or rales.  Abdominal:     General: There is no distension.     Palpations: Abdomen is soft.     Tenderness: There is no abdominal tenderness.  Neurological:     Mental Status: He is alert.  Psychiatric:        Mood and Affect: Mood normal.        Behavior: Behavior normal.     Lab Results  Component Value Date   WBC 6.2 05/06/2020   HGB 15.4 05/06/2020   HCT 45.2 05/06/2020   PLT 184 05/06/2020   GLUCOSE 98 05/06/2020   CHOL 114 01/26/2021   TRIG 74 01/26/2021   HDL 37 (L) 01/26/2021   LDLCALC 62 01/26/2021   ALT 25 05/06/2020   AST 20 05/06/2020   NA  147 (H) 05/06/2020   K 4.4 05/06/2020   CL 104 05/06/2020   CREATININE 1.22 05/06/2020   BUN 12 05/06/2020   CO2 24 05/06/2020   TSH 2.720 04/08/2016   PSA 0.7 03/29/2014     Assessment & Plan:   Problem List Items Addressed This Visit       Cardiovascular and Mediastinum   Essential hypertension, benign - Primary    Stable.  Continue current medication.      Relevant Medications   tadalafil (CIALIS) 10 MG tablet   Other Relevant Orders   CMP14+EGFR     Other   Erectile dysfunction    Stable.  Refilled tadalafil.      Hyperlipidemia    Well-controlled.  Labs ordered.  Continue Crestor and Zetia.      Relevant Medications   tadalafil (CIALIS) 10 MG tablet   Other Relevant Orders   Lipid panel   Other Visit Diagnoses     Screening for deficiency anemia       Chronic anticoagulation       Relevant Orders   CBC   Prostate cancer screening       Relevant Orders   PSA   Need for vaccination       Relevant Orders   Pneumococcal conjugate vaccine 20-valent (Prevnar 20) (Completed)       Meds ordered this encounter  Medications   tadalafil (CIALIS) 10 MG tablet    Sig: Take 1 tablet (10 mg total) by mouth daily as needed for erectile dysfunction.    Dispense:  10 tablet    Refill:  5    Follow-up:  Return in about 6 months (around 12/29/2021).  [place]

## 2021-06-30 LAB — CMP14+EGFR
ALT: 23 IU/L (ref 0–44)
AST: 21 IU/L (ref 0–40)
Albumin/Globulin Ratio: 2.1 (ref 1.2–2.2)
Albumin: 4.4 g/dL (ref 3.8–4.8)
Alkaline Phosphatase: 59 IU/L (ref 44–121)
BUN/Creatinine Ratio: 10 (ref 10–24)
BUN: 13 mg/dL (ref 8–27)
Bilirubin Total: 0.6 mg/dL (ref 0.0–1.2)
CO2: 27 mmol/L (ref 20–29)
Calcium: 9.6 mg/dL (ref 8.6–10.2)
Chloride: 103 mmol/L (ref 96–106)
Creatinine, Ser: 1.26 mg/dL (ref 0.76–1.27)
Globulin, Total: 2.1 g/dL (ref 1.5–4.5)
Glucose: 96 mg/dL (ref 70–99)
Potassium: 4.3 mmol/L (ref 3.5–5.2)
Sodium: 142 mmol/L (ref 134–144)
Total Protein: 6.5 g/dL (ref 6.0–8.5)
eGFR: 63 mL/min/{1.73_m2} (ref >59)

## 2021-06-30 LAB — LIPID PANEL
Chol/HDL Ratio: 3.1 ratio (ref 0.0–5.0)
Cholesterol, Total: 105 mg/dL (ref 100–199)
HDL: 34 mg/dL — ABNORMAL LOW (ref >39)
LDL Chol Calc (NIH): 54 mg/dL (ref 0–99)
Triglycerides: 86 mg/dL (ref 0–149)
VLDL Cholesterol Cal: 17 mg/dL (ref 5–40)

## 2021-06-30 LAB — CBC
Hematocrit: 45 % (ref 37.5–51.0)
Hemoglobin: 15.3 g/dL (ref 13.0–17.7)
MCH: 31.5 pg (ref 26.6–33.0)
MCHC: 34 g/dL (ref 31.5–35.7)
MCV: 93 fL (ref 79–97)
Platelets: 182 10*3/uL (ref 150–450)
RBC: 4.85 x10E6/uL (ref 4.14–5.80)
RDW: 12.5 % (ref 11.6–15.4)
WBC: 8 10*3/uL (ref 3.4–10.8)

## 2021-06-30 LAB — PSA: Prostate Specific Ag, Serum: 0.5 ng/mL (ref 0.0–4.0)

## 2021-07-06 ENCOUNTER — Ambulatory Visit: Payer: BC Managed Care – PPO | Admitting: Internal Medicine

## 2021-07-20 ENCOUNTER — Other Ambulatory Visit: Payer: Self-pay | Admitting: Internal Medicine

## 2021-07-24 ENCOUNTER — Encounter: Payer: Self-pay | Admitting: Internal Medicine

## 2021-07-24 ENCOUNTER — Ambulatory Visit: Payer: Medicare Other | Admitting: Internal Medicine

## 2021-07-24 VITALS — BP 110/70 | HR 58 | Ht 72.0 in | Wt 203.0 lb

## 2021-07-24 DIAGNOSIS — I4819 Other persistent atrial fibrillation: Secondary | ICD-10-CM

## 2021-07-24 DIAGNOSIS — I10 Essential (primary) hypertension: Secondary | ICD-10-CM

## 2021-07-24 DIAGNOSIS — D6869 Other thrombophilia: Secondary | ICD-10-CM | POA: Diagnosis not present

## 2021-07-24 MED ORDER — APIXABAN 5 MG PO TABS: 5.0000 mg | ORAL_TABLET | Freq: Two times a day (BID) | ORAL | 11 refills | Status: DC
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
## 2021-07-24 NOTE — Patient Instructions (Signed)
Medication Instructions:  Eliquis refilled today  Continue all other medications.     Labwork: none  Testing/Procedures: none  Follow-Up: 1 year - Dr.  Rayann Heman   Any Other Special Instructions Will Be Listed Below (If Applicable).   If you need a refill on your cardiac medications before your next appointment, please call your pharmacy.

## 2021-08-04 ENCOUNTER — Other Ambulatory Visit: Payer: Self-pay | Admitting: Family Medicine

## 2021-08-04 DIAGNOSIS — I10 Essential (primary) hypertension: Secondary | ICD-10-CM

## 2021-08-11 ENCOUNTER — Other Ambulatory Visit: Payer: Self-pay | Admitting: Family Medicine

## 2021-08-11 DIAGNOSIS — E7800 Pure hypercholesterolemia, unspecified: Secondary | ICD-10-CM

## 2021-08-26 ENCOUNTER — Other Ambulatory Visit: Payer: Self-pay | Admitting: Family Medicine

## 2021-08-26 DIAGNOSIS — M10062 Idiopathic gout, left knee: Secondary | ICD-10-CM

## 2021-09-19 ENCOUNTER — Other Ambulatory Visit: Payer: Self-pay | Admitting: Family Medicine

## 2021-09-19 DIAGNOSIS — I10 Essential (primary) hypertension: Secondary | ICD-10-CM

## 2021-09-25 ENCOUNTER — Other Ambulatory Visit: Payer: Self-pay | Admitting: Family Medicine

## 2021-09-25 DIAGNOSIS — I10 Essential (primary) hypertension: Secondary | ICD-10-CM

## 2021-09-25 DIAGNOSIS — E7800 Pure hypercholesterolemia, unspecified: Secondary | ICD-10-CM

## 2021-10-16 ENCOUNTER — Telehealth: Payer: Self-pay | Admitting: Family Medicine

## 2021-10-16 NOTE — Telephone Encounter (Signed)
HCTZ has been sent to pharmacy on 09/22/21.

## 2021-10-16 NOTE — Telephone Encounter (Signed)
Fax requesting a refill on  hydrochlorothiazide (HYDRODIURIL) 25 MG tablet [[id number]]    Order Details Dose, Route, Frequency: As Directed  Dispense Quantity: 100 tablet Refills: 0   Note to Pharmacy: Please send a replace/new response with 100-Day Supply if appropriate to maximize member benefit. Requesting 1 year supply.       Sig: TAKE 1 TABLET BY MOUTH DAILY       Start Date: 09/22/21 End Date: --  Written Date: 09/22/21 Expiration Date: 09/22/22     Associated Diagnoses: Essential hypertension, benign [I10]  Original Order:  hydrochlorothiazide (HYDRODIURIL) 25 MG tablet [[id number]]  Providers  Ordering and Authorizing Provider:   Coral Spikes, DO  [place], [place] [address]  Phone:  [phone]   Fax:  [phone]  DEA #:  [id number]   NPI:  [id number]      Ordering User:  Leda Min, [place], [place]

## 2021-10-21 ENCOUNTER — Ambulatory Visit (INDEPENDENT_AMBULATORY_CARE_PROVIDER_SITE_OTHER): Payer: Medicare Other

## 2021-10-21 VITALS — Wt 203.0 lb

## 2021-10-21 DIAGNOSIS — Z0000 Encounter for general adult medical examination without abnormal findings: Secondary | ICD-10-CM | POA: Diagnosis not present
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
## 2021-10-21 NOTE — Patient Instructions (Signed)
Anthony [name] , Thank you for taking time to come for your Medicare Wellness Visit. I appreciate your ongoing commitment to your health goals. Please review the following plan we discussed and let me know if I can assist you in the future.   Screening recommendations/referrals: Colonoscopy: 08/30/19, every 10 years Recommended yearly ophthalmology/optometry visit for glaucoma screening and checkup Recommended yearly dental visit for hygiene and checkup  Vaccinations: Influenza vaccine: n/d Pneumococcal vaccine: n/d Tdap vaccine: 03/29/19 Shingles vaccine: Shingrix  04/05/16 Covid-19: 07/20/19, 08/20/19  Advanced directives: no   Conditions/risks identified: none  Next appointment: Follow up in one year for your annual wellness visit. 10/26/22 @ 8:15 am by phone  Preventive Care 65 Years and Older, Male Preventive care refers to lifestyle choices and visits with your health care provider that can promote health and wellness. What does preventive care include? A yearly physical exam. This is also called an annual well check. Dental exams once or twice a year. Routine eye exams. Ask your health care provider how often you should have your eyes checked. Personal lifestyle choices, including: Daily care of your teeth and gums. Regular physical activity. Eating a healthy diet. Avoiding tobacco and drug use. Limiting alcohol use. Practicing safe sex. Taking low doses of aspirin every day. Taking vitamin and mineral supplements as recommended by your health care provider. What happens during an annual well check? The services and screenings done by your health care provider during your annual well check will depend on your age, overall health, lifestyle risk factors, and family history of disease. Counseling  Your health care provider may ask you questions about your: Alcohol use. Tobacco use. Drug use. Emotional well-being. Home and relationship well-being. Sexual activity. Eating  habits. History of falls. Memory and ability to understand (cognition). Work and work Statistician. Screening  You may have the following tests or measurements: Height, weight, and BMI. Blood pressure. Lipid and cholesterol levels. These may be checked every 5 years, or more frequently if you are over 16 years old. Skin check. Lung cancer screening. You may have this screening every year starting at age 36 if you have a 30-pack-year history of smoking and currently smoke or have quit within the past 15 years. Fecal occult blood test (FOBT) of the stool. You may have this test every year starting at age 23. Flexible sigmoidoscopy or colonoscopy. You may have a sigmoidoscopy every 5 years or a colonoscopy every 10 years starting at age 96. Prostate cancer screening. Recommendations will vary depending on your family history and other risks. Hepatitis C blood test. Hepatitis B blood test. Sexually transmitted disease (STD) testing. Diabetes screening. This is done by checking your blood sugar (glucose) after you have not eaten for a while (fasting). You may have this done every 1-3 years. Abdominal aortic aneurysm (AAA) screening. You may need this if you are a current or former smoker. Osteoporosis. You may be screened starting at age 15 if you are at high risk. Talk with your health care provider about your test results, treatment options, and if necessary, the need for more tests. Vaccines  Your health care provider may recommend certain vaccines, such as: Influenza vaccine. This is recommended every year. Tetanus, diphtheria, and acellular pertussis (Tdap, Td) vaccine. You may need a Td booster every 10 years. Zoster vaccine. You may need this after age 20. Pneumococcal 13-valent conjugate (PCV13) vaccine. One dose is recommended after age 84. Pneumococcal polysaccharide (PPSV23) vaccine. One dose is recommended after age 36. Talk to  your health care provider about which screenings and  vaccines you need and how often you need them. This information is not intended to replace advice given to you by your health care provider. Make sure you discuss any questions you have with your health care provider. Document Released: 01/31/2015 Document Revised: 09/24/2015 Document Reviewed: 11/05/2014 Elsevier Interactive Patient Education  2017 [place] Prevention in the Home Falls can cause injuries. They can happen to people of all ages. There are many things you can do to make your home safe and to help prevent falls. What can I do on the outside of my home? Regularly fix the edges of walkways and driveways and fix any cracks. Remove anything that might make you trip as you walk through a door, such as a raised step or threshold. Trim any bushes or trees on the path to your home. Use bright outdoor lighting. Clear any walking paths of anything that might make someone trip, such as rocks or tools. Regularly check to see if handrails are loose or broken. Make sure that both sides of any steps have handrails. Any raised decks and porches should have guardrails on the edges. Have any leaves, snow, or ice cleared regularly. Use sand or salt on walking paths during winter. Clean up any spills in your garage right away. This includes oil or grease spills. What can I do in the bathroom? Use night lights. Install grab bars by the toilet and in the tub and shower. Do not use towel bars as grab bars. Use non-skid mats or decals in the tub or shower. If you need to sit down in the shower, use a plastic, non-slip stool. Keep the floor dry. Clean up any water that spills on the floor as soon as it happens. Remove soap buildup in the tub or shower regularly. Attach bath mats securely with double-sided non-slip rug tape. Do not have throw rugs and other things on the floor that can make you trip. What can I do in the bedroom? Use night lights. Make sure that you have a light by your  bed that is easy to reach. Do not use any sheets or blankets that are too big for your bed. They should not hang down onto the floor. Have a firm chair that has side arms. You can use this for support while you get dressed. Do not have throw rugs and other things on the floor that can make you trip. What can I do in the kitchen? Clean up any spills right away. Avoid walking on wet floors. Keep items that you use a lot in easy-to-reach places. If you need to reach something above you, use a strong step stool that has a grab bar. Keep electrical cords out of the way. Do not use floor polish or wax that makes floors slippery. If you must use wax, use non-skid floor wax. Do not have throw rugs and other things on the floor that can make you trip. What can I do with my stairs? Do not leave any items on the stairs. Make sure that there are handrails on both sides of the stairs and use them. Fix handrails that are broken or loose. Make sure that handrails are as long as the stairways. Check any carpeting to make sure that it is firmly attached to the stairs. Fix any carpet that is loose or worn. Avoid having throw rugs at the top or bottom of the stairs. If you do have throw rugs, attach  them to the floor with carpet tape. Make sure that you have a light switch at the top of the stairs and the bottom of the stairs. If you do not have them, ask someone to add them for you. What else can I do to help prevent falls? Wear shoes that: Do not have high heels. Have rubber bottoms. Are comfortable and fit you well. Are closed at the toe. Do not wear sandals. If you use a stepladder: Make sure that it is fully opened. Do not climb a closed stepladder. Make sure that both sides of the stepladder are locked into place. Ask someone to hold it for you, if possible. Clearly mark and make sure that you can see: Any grab bars or handrails. First and last steps. Where the edge of each step is. Use tools that  help you move around (mobility aids) if they are needed. These include: Canes. Walkers. Scooters. Crutches. Turn on the lights when you go into a dark area. Replace any light bulbs as soon as they burn out. Set up your furniture so you have a clear path. Avoid moving your furniture around. If any of your floors are uneven, fix them. If there are any pets around you, be aware of where they are. Review your medicines with your doctor. Some medicines can make you feel dizzy. This can increase your chance of falling. Ask your doctor what other things that you can do to help prevent falls. This information is not intended to replace advice given to you by your health care provider. Make sure you discuss any questions you have with your health care provider. Document Released: 10/31/2008 Document Revised: 06/12/2015 Document Reviewed: 02/08/2014 Elsevier Interactive Patient Education  2017 [employer].

## 2021-11-05 ENCOUNTER — Other Ambulatory Visit: Payer: Self-pay | Admitting: Internal Medicine

## 2021-11-09 ENCOUNTER — Other Ambulatory Visit: Payer: Self-pay | Admitting: Family Medicine

## 2021-11-09 ENCOUNTER — Other Ambulatory Visit: Payer: Self-pay

## 2021-11-09 DIAGNOSIS — I10 Essential (primary) hypertension: Secondary | ICD-10-CM

## 2021-11-09 MED ORDER — ROSUVASTATIN CALCIUM 5 MG PO TABS: 5.0000 mg | ORAL_TABLET | Freq: Every day | ORAL | 1 refills | Status: DC

## 2021-11-27 ENCOUNTER — Other Ambulatory Visit: Payer: Self-pay

## 2021-11-27 MED ORDER — METOPROLOL SUCCINATE ER 25 MG PO TB24: 25.0000 mg | ORAL_TABLET | Freq: Every day | ORAL | 2 refills | Status: DC

## 2021-11-29 ENCOUNTER — Other Ambulatory Visit: Payer: Self-pay | Admitting: Family Medicine

## 2021-11-29 DIAGNOSIS — M10062 Idiopathic gout, left knee: Secondary | ICD-10-CM

## 2021-11-29 MED ORDER — ALLOPURINOL 100 MG PO TABS: ORAL_TABLET | ORAL | 2 refills | Status: DC

## 2021-12-04 ENCOUNTER — Other Ambulatory Visit: Payer: Self-pay | Admitting: Family Medicine

## 2021-12-04 DIAGNOSIS — M10062 Idiopathic gout, left knee: Secondary | ICD-10-CM

## 2021-12-09 IMAGING — DX DG CHEST 2V
2 series · 2 of 2 positions shown · non-contrast
Comparison: 03/18/2018

CLINICAL DATA: Short of breath, cough for 2-4 weeks

EXAM:
CHEST - 2 VIEW

[chest pa]
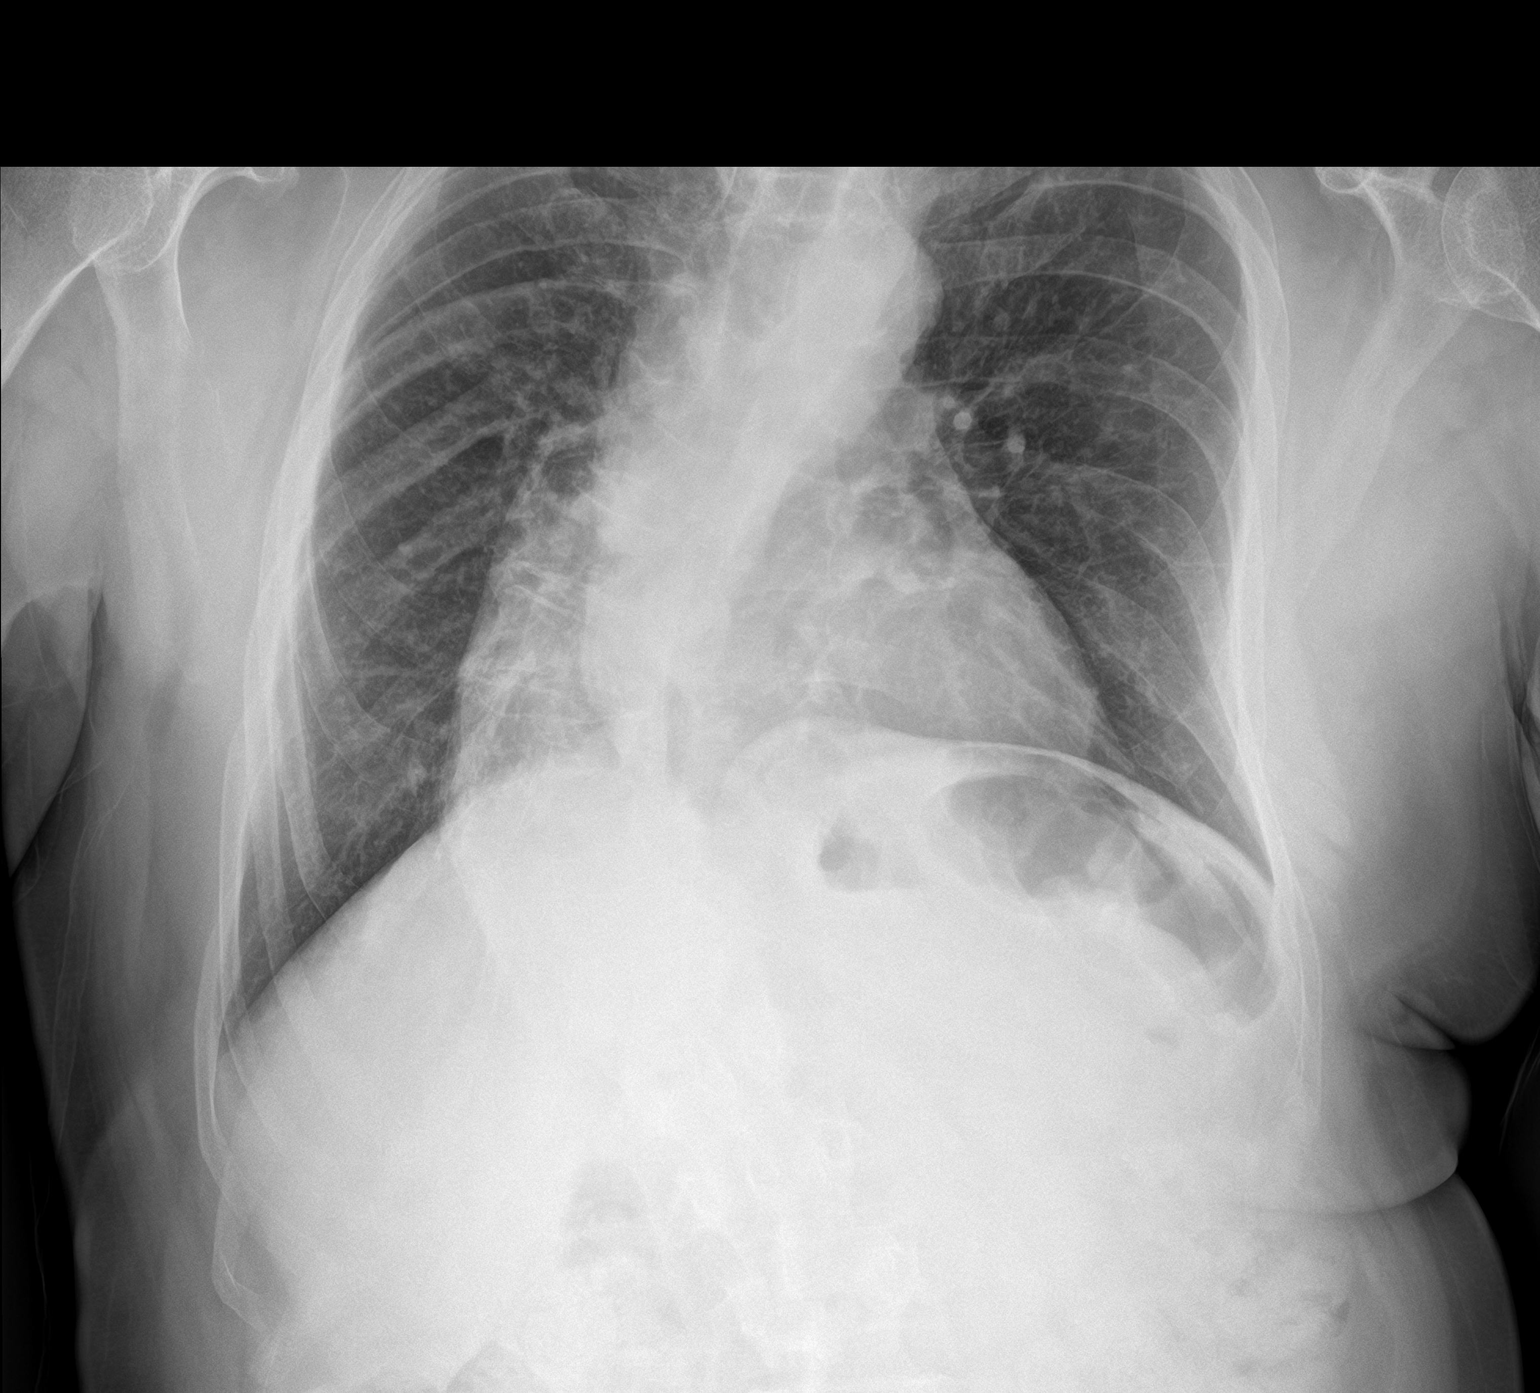

[chest lat]
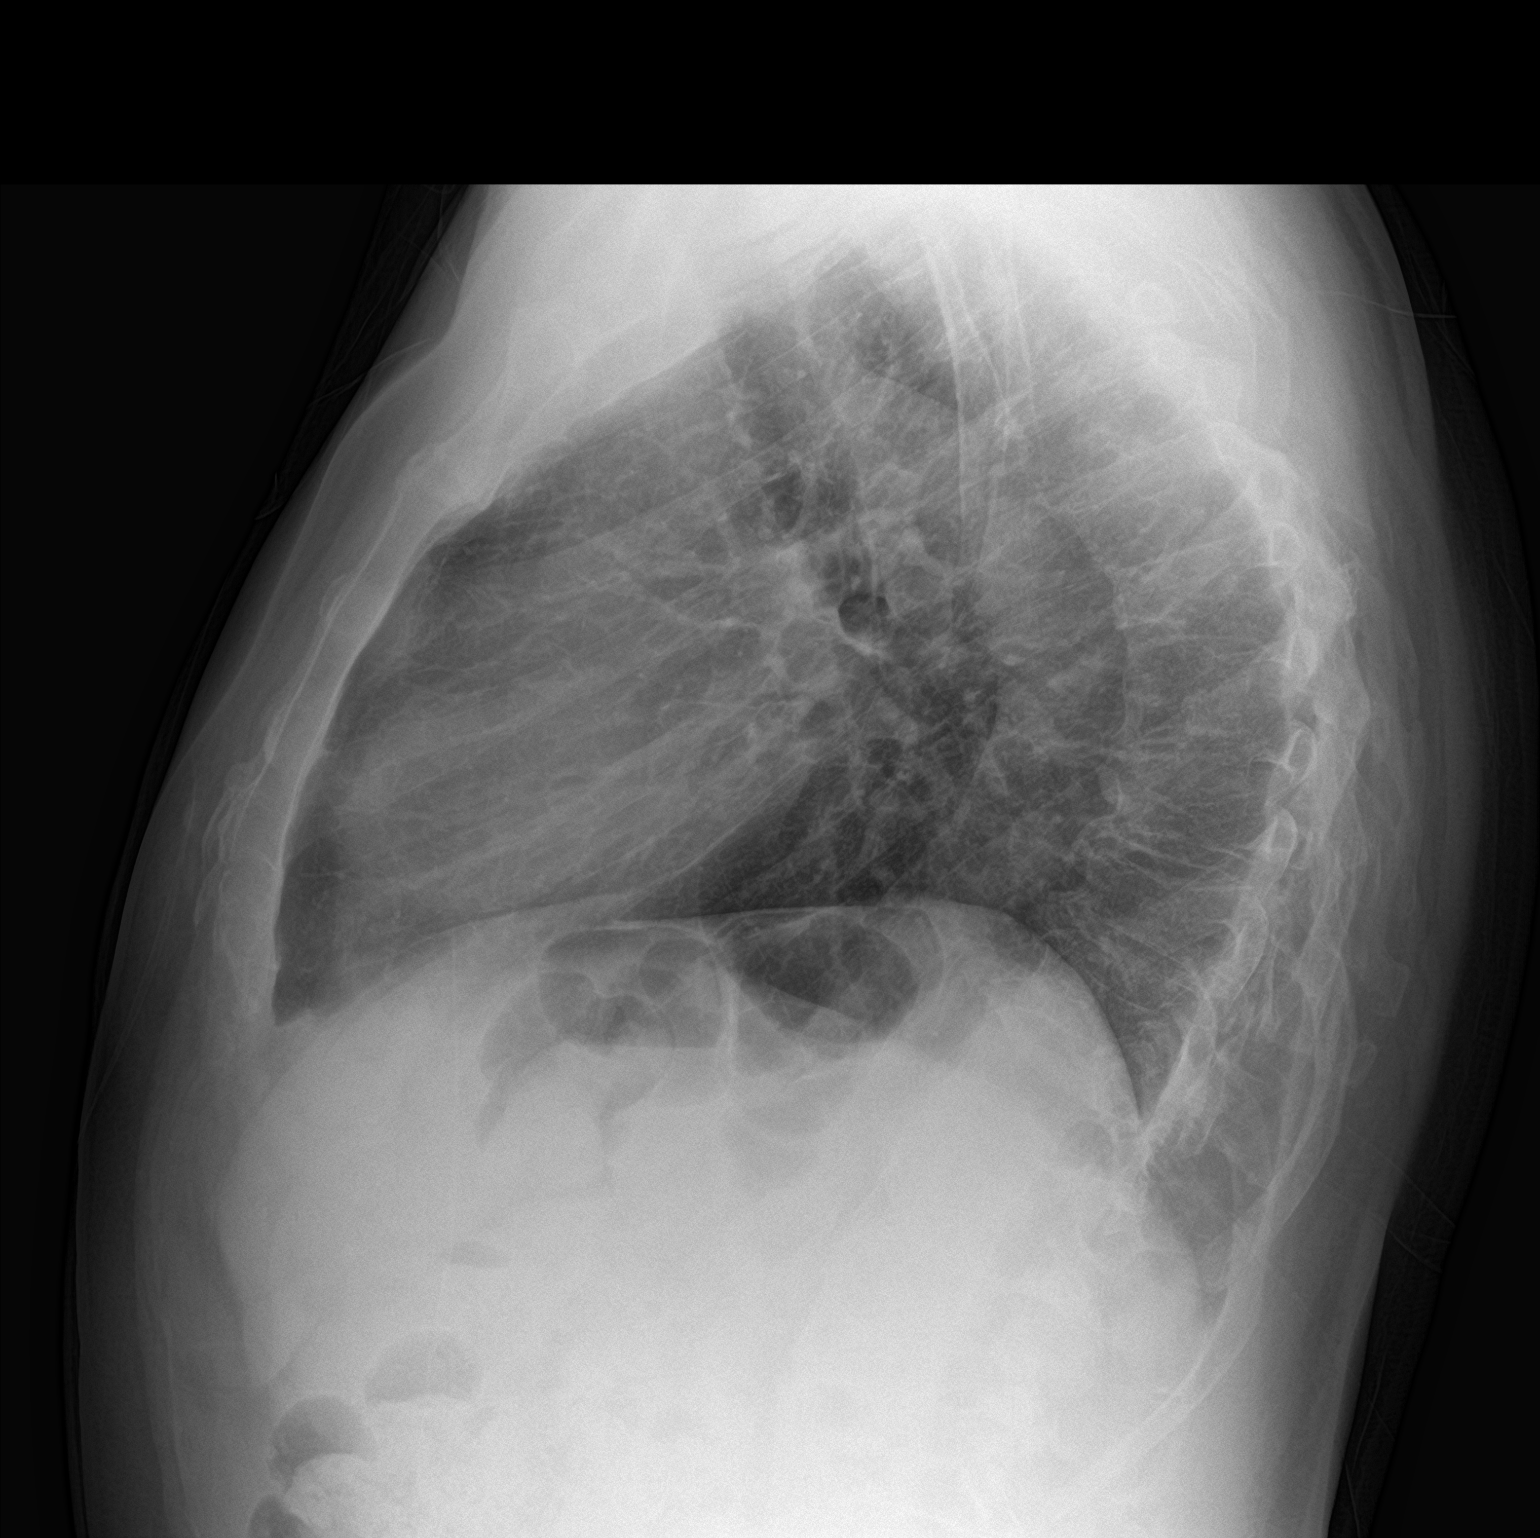

[2 of 2 positions shown; findings below may reference images not displayed]

FINDINGS: Frontal and lateral views of the chest demonstrate a stable cardiac
silhouette. No airspace disease, effusion, or pneumothorax. Marked
right convex thoracic scoliosis unchanged.
IMPRESSION: 1. Stable exam, no acute process.

## 2021-12-29 ENCOUNTER — Ambulatory Visit (INDEPENDENT_AMBULATORY_CARE_PROVIDER_SITE_OTHER): Payer: Medicare Other | Admitting: Family Medicine

## 2021-12-29 VITALS — BP 110/64 | HR 64 | Ht 72.0 in | Wt 202.6 lb

## 2021-12-29 DIAGNOSIS — E7800 Pure hypercholesterolemia, unspecified: Secondary | ICD-10-CM

## 2021-12-29 DIAGNOSIS — I4819 Other persistent atrial fibrillation: Secondary | ICD-10-CM | POA: Diagnosis not present

## 2021-12-29 DIAGNOSIS — G4733 Obstructive sleep apnea (adult) (pediatric): Secondary | ICD-10-CM

## 2021-12-29 DIAGNOSIS — M6530 Trigger finger, unspecified finger: Secondary | ICD-10-CM

## 2021-12-29 DIAGNOSIS — M1A9XX0 Chronic gout, unspecified, without tophus (tophi): Secondary | ICD-10-CM

## 2021-12-29 DIAGNOSIS — I10 Essential (primary) hypertension: Secondary | ICD-10-CM | POA: Diagnosis not present

## 2021-12-29 DIAGNOSIS — K219 Gastro-esophageal reflux disease without esophagitis: Secondary | ICD-10-CM

## 2021-12-29 NOTE — Progress Notes (Signed)
Subjective:  Patient ID: Anthony [name], male    DOB: 1955/02/22  Age: 66 y.o. MRN: [id number]  CC: Chief Complaint  Patient presents with   Hypertension    Follow up Thinks he has trigger fingers in right hand    HPI:  66 year old male with the below mentioned medical problems presents for follow-up.  Patient's hypertension is well-controlled on verapamil, lisinopril, and HCTZ.  Doing well regarding A-fib.  Follows with cardiology.  Rate controlled.   No recent gout flares.  Stable on allopurinol 300 mg daily.  GERD stable on Protonix.  Patient reports that he is recently been experiencing some issues with trigger fingers.  Affects the third and fourth digits of the right hand.  Troublesome at times.  Significant pain at times.  Patient also reports that he is recently had some difficulty sleeping.  Waking up in the middle the night.  Not compliant with CPAP.  Patient Active Problem List   Diagnosis Date Noted   Trigger finger 12/29/2021   RLS (restless legs syndrome) 04/21/2021   Gout 12/24/2020   Erectile dysfunction 12/24/2020   Anaphylactic shock due to adverse food reaction 06/02/2017   OSA on CPAP    Hyperlipidemia    History of kidney stones    GERD (gastroesophageal reflux disease)    Persistent atrial fibrillation (HCC)    Essential hypertension, benign 06/02/2012    Social Hx   Social History   Socioeconomic History   Marital status: Married    Spouse name: Not on file   Number of children: Not on file   Years of education: Not on file   Highest education level: Not on file  Occupational History   Not on file  Tobacco Use   Smoking status: Never   Smokeless tobacco: Never  Vaping Use   Vaping Use: Never used  Substance and Sexual Activity   Alcohol use: No   Drug use: No   Sexual activity: Yes  Other Topics Concern   Not on file  Social History Narrative   Lives in [place]   Works at [employer] and [place]   Married   Social  Determinants of Health   Financial Resource Strain: Low Risk  (10/21/2021)   Overall Financial Resource Strain (CARDIA)    Difficulty of Paying Living Expenses: Not hard at all  Food Insecurity: No Food Insecurity (10/21/2021)   Hunger Vital Sign    Worried About Running Out of Food in the Last Year: Never true    Ran Out of Food in the Last Year: Never true  Transportation Needs: No Transportation Needs (10/21/2021)   PRAPARE - Hydrologist (Medical): No    Lack of Transportation (Non-Medical): No  Physical Activity: Sufficiently Active (10/21/2021)   Exercise Vital Sign    Days of Exercise per Week: 4 days    Minutes of Exercise per Session: 40 min  Stress: No Stress Concern Present (10/21/2021)   [place]    Feeling of Stress : Not at all  Social Connections: Moderately Isolated (10/21/2021)   Social Connection and Isolation Panel [NHANES]    Frequency of Communication with Friends and Family: More than three times a week    Frequency of Social Gatherings with Friends and Family: More than three times a week    Attends Religious Services: Never    Marine scientist or Organizations: No    Attends Archivist Meetings: Never  Marital Status: Married    Review of Systems Per HPI  Objective:  BP 110/64   Pulse 64   Ht 6' (1.829 m)   Wt 202 lb 9.6 oz (91.9 kg)   BMI 27.48 kg/m      12/29/2021    8:12 AM 10/21/2021    8:34 AM 07/24/2021    8:21 AM  BP/Weight  Systolic BP 086  578  Diastolic BP 64  70  Wt. (Lbs) 202.6 203 203  BMI 27.48 kg/m2 27.53 kg/m2 27.53 kg/m2    Physical Exam Vitals and nursing note reviewed.  Constitutional:      General: He is not in acute distress.    Appearance: Normal appearance.  HENT:     Head: Normocephalic and atraumatic.  Eyes:     General:        Right eye: No discharge.        Left eye: No discharge.      Conjunctiva/sclera: Conjunctivae normal.  Cardiovascular:     Rate and Rhythm: Regular rhythm. Bradycardia present.  Pulmonary:     Effort: Pulmonary effort is normal.     Breath sounds: Normal breath sounds. No wheezing, rhonchi or rales.  Neurological:     Mental Status: He is alert.  Psychiatric:        Mood and Affect: Mood normal.        Behavior: Behavior normal.     Lab Results  Component Value Date   WBC 8.0 06/29/2021   HGB 15.3 06/29/2021   HCT 45.0 06/29/2021   PLT 182 06/29/2021   GLUCOSE 96 06/29/2021   CHOL 105 06/29/2021   TRIG 86 06/29/2021   HDL 34 (L) 06/29/2021   LDLCALC 54 06/29/2021   ALT 23 06/29/2021   AST 21 06/29/2021   NA 142 06/29/2021   K 4.3 06/29/2021   CL 103 06/29/2021   CREATININE 1.26 06/29/2021   BUN 13 06/29/2021   CO2 27 06/29/2021   TSH 2.720 04/08/2016   PSA 0.7 03/29/2014     Assessment & Plan:   Problem List Items Addressed This Visit       Cardiovascular and Mediastinum   Essential hypertension, benign - Primary    Well-controlled.  Continue verapamil, HCTZ, and lisinopril.      Relevant Medications   aspirin EC 81 MG tablet   Persistent atrial fibrillation (HCC)    Rate controlled.  Continue current medications.  Patient is now off Eliquis.  I believe that this is likely secondary to cost.  Aspirin daily.  Has follow-up with cardiology.      Relevant Medications   aspirin EC 81 MG tablet     Respiratory   OSA on CPAP    Advised compliance with CPAP.  He is concerned that he needs an updated sleep study.  He wants to defer this until next year.        Digestive   GERD (gastroesophageal reflux disease)    Stable on Protonix.  Continue.        Musculoskeletal and Integument   Trigger finger    Affects the third and fourth digits of the right hand.  Discussed referral to hand surgery.  Patient wants to wait at this time.        Other   Gout    No recent flares.  Continue allopurinol 300 mg daily.       Hyperlipidemia    Well-controlled on Crestor.  Continue.      Relevant Medications  aspirin EC 81 MG tablet    Follow-up:  Return in about 6 months (around 06/30/2022).  [place]

## 2021-12-29 NOTE — Assessment & Plan Note (Signed)
Advised compliance with CPAP.  He is concerned that he needs an updated sleep study.  He wants to defer this until next year.

## 2021-12-29 NOTE — Assessment & Plan Note (Signed)
Stable on Protonix.  Continue. 

## 2021-12-29 NOTE — Assessment & Plan Note (Signed)
Rate controlled.  Continue current medications.  Patient is now off Eliquis.  I believe that this is likely secondary to cost.  Aspirin daily.  Has follow-up with cardiology.

## 2021-12-29 NOTE — Assessment & Plan Note (Signed)
Well-controlled.  Continue verapamil, HCTZ, and lisinopril.

## 2021-12-29 NOTE — Assessment & Plan Note (Signed)
No recent flares.  Continue allopurinol 300 mg daily. 

## 2021-12-29 NOTE — Assessment & Plan Note (Signed)
Well-controlled on Crestor.  Continue.

## 2021-12-29 NOTE — Patient Instructions (Signed)
If you change mind regarding the trigger fingers please let me know.  Continue medications.  Follow-up in 6 months

## 2021-12-29 NOTE — Assessment & Plan Note (Signed)
Affects the third and fourth digits of the right hand.  Discussed referral to hand surgery.  Patient wants to wait at this time.

## 2022-01-04 ENCOUNTER — Other Ambulatory Visit: Payer: Self-pay | Admitting: Family Medicine

## 2022-02-11 ENCOUNTER — Other Ambulatory Visit: Payer: Self-pay

## 2022-02-11 DIAGNOSIS — E7800 Pure hypercholesterolemia, unspecified: Secondary | ICD-10-CM

## 2022-02-11 MED ORDER — EZETIMIBE 10 MG PO TABS: 10.0000 mg | ORAL_TABLET | Freq: Every day | ORAL | 1 refills | Status: DC

## 2022-02-25 ENCOUNTER — Encounter (HOSPITAL_COMMUNITY): Payer: Self-pay | Admitting: *Deleted

## 2022-02-27 DIAGNOSIS — H43393 Other vitreous opacities, bilateral: Secondary | ICD-10-CM | POA: Diagnosis not present

## 2022-02-27 DIAGNOSIS — H2513 Age-related nuclear cataract, bilateral: Secondary | ICD-10-CM | POA: Diagnosis not present

## 2022-03-02 ENCOUNTER — Other Ambulatory Visit: Payer: Self-pay | Admitting: Family Medicine

## 2022-03-02 DIAGNOSIS — K219 Gastro-esophageal reflux disease without esophagitis: Secondary | ICD-10-CM

## 2022-03-09 DIAGNOSIS — H25811 Combined forms of age-related cataract, right eye: Secondary | ICD-10-CM | POA: Diagnosis not present

## 2022-03-09 DIAGNOSIS — H25812 Combined forms of age-related cataract, left eye: Secondary | ICD-10-CM | POA: Diagnosis not present

## 2022-03-25 DIAGNOSIS — H25812 Combined forms of age-related cataract, left eye: Secondary | ICD-10-CM | POA: Diagnosis not present

## 2022-03-25 DIAGNOSIS — H269 Unspecified cataract: Secondary | ICD-10-CM | POA: Diagnosis not present

## 2022-03-26 DIAGNOSIS — H25812 Combined forms of age-related cataract, left eye: Secondary | ICD-10-CM | POA: Diagnosis not present

## 2022-04-08 DIAGNOSIS — H2513 Age-related nuclear cataract, bilateral: Secondary | ICD-10-CM | POA: Diagnosis not present

## 2022-04-08 DIAGNOSIS — H25811 Combined forms of age-related cataract, right eye: Secondary | ICD-10-CM | POA: Diagnosis not present

## 2022-04-08 DIAGNOSIS — H269 Unspecified cataract: Secondary | ICD-10-CM | POA: Diagnosis not present

## 2022-04-21 DIAGNOSIS — R0981 Nasal congestion: Secondary | ICD-10-CM | POA: Diagnosis not present

## 2022-04-21 DIAGNOSIS — J029 Acute pharyngitis, unspecified: Secondary | ICD-10-CM | POA: Diagnosis not present

## 2022-04-21 DIAGNOSIS — R062 Wheezing: Secondary | ICD-10-CM | POA: Diagnosis not present

## 2022-04-21 DIAGNOSIS — R051 Acute cough: Secondary | ICD-10-CM | POA: Diagnosis not present

## 2022-05-09 ENCOUNTER — Other Ambulatory Visit: Payer: Self-pay | Admitting: Family Medicine

## 2022-05-09 DIAGNOSIS — I10 Essential (primary) hypertension: Secondary | ICD-10-CM

## 2022-05-10 ENCOUNTER — Other Ambulatory Visit: Payer: Self-pay | Admitting: Family Medicine

## 2022-05-10 DIAGNOSIS — I10 Essential (primary) hypertension: Secondary | ICD-10-CM

## 2022-05-10 DIAGNOSIS — K219 Gastro-esophageal reflux disease without esophagitis: Secondary | ICD-10-CM

## 2022-05-17 ENCOUNTER — Telehealth: Payer: Self-pay | Admitting: Family Medicine

## 2022-05-17 NOTE — Telephone Encounter (Signed)
Contacted Deitra Mayo to schedule their annual wellness visit. Appointment made for 10/29/2022.  Thank you,  Judeth Cornfield,  AMB Clinical Support [facility] Direct Dial ??[phone]

## 2022-05-27 ENCOUNTER — Other Ambulatory Visit: Payer: Self-pay | Admitting: Family Medicine

## 2022-05-27 ENCOUNTER — Other Ambulatory Visit: Payer: Self-pay

## 2022-05-27 DIAGNOSIS — M10062 Idiopathic gout, left knee: Secondary | ICD-10-CM

## 2022-05-27 MED ORDER — METOPROLOL SUCCINATE ER 25 MG PO TB24: 25.0000 mg | ORAL_TABLET | Freq: Every day | ORAL | 1 refills | Status: DC

## 2022-06-24 ENCOUNTER — Ambulatory Visit (INDEPENDENT_AMBULATORY_CARE_PROVIDER_SITE_OTHER): Payer: Medicare Other | Admitting: Family Medicine

## 2022-06-24 VITALS — BP 124/75 | Ht 72.0 in | Wt 189.8 lb

## 2022-06-24 DIAGNOSIS — W57XXXA Bitten or stung by nonvenomous insect and other nonvenomous arthropods, initial encounter: Secondary | ICD-10-CM | POA: Insufficient documentation

## 2022-06-24 DIAGNOSIS — S30860A Insect bite (nonvenomous) of lower back and pelvis, initial encounter: Secondary | ICD-10-CM

## 2022-06-24 MED ORDER — TRIAMCINOLONE ACETONIDE 0.5 % EX OINT: 1.0000 | TOPICAL_OINTMENT | Freq: Two times a day (BID) | CUTANEOUS | 0 refills | Status: DC

## 2022-06-24 NOTE — Assessment & Plan Note (Signed)
No evidence of tick borne illness or infection. Topical triamcinolone as directed.

## 2022-06-24 NOTE — Patient Instructions (Signed)
Medication as prescribed.  Take care  Dr. Zaheer Wageman  

## 2022-06-24 NOTE — Progress Notes (Signed)
Subjective:  Patient ID: Anthony [name], male    DOB: 1955/05/04  Age: 67 y.o. MRN: [id number]  CC: Chief Complaint  Patient presents with   tick bites    15-20 tick bites over last several weeks    HPI:  67 year old male presents for evaluation of the above.  Patient has had several tick bites recently. He has ~ 3 currently that are still bothering him. They are quite itchy. No rash. No fever. No relief with OTC treatment.   Patient Active Problem List   Diagnosis Date Noted   Tick bite 06/24/2022   Trigger finger 12/29/2021   RLS (restless legs syndrome) 04/21/2021   Gout 12/24/2020   Erectile dysfunction 12/24/2020   Anaphylactic shock due to adverse food reaction 06/02/2017   OSA on CPAP    Hyperlipidemia    History of kidney stones    GERD (gastroesophageal reflux disease)    Persistent atrial fibrillation (HCC)    Essential hypertension, benign 06/02/2012    Social Hx   Social History   Socioeconomic History   Marital status: Married    Spouse name: Not on file   Number of children: Not on file   Years of education: Not on file   Highest education level: Not on file  Occupational History   Not on file  Tobacco Use   Smoking status: Never   Smokeless tobacco: Never  Vaping Use   Vaping Use: Never used  Substance and Sexual Activity   Alcohol use: No   Drug use: No   Sexual activity: Yes  Other Topics Concern   Not on file  Social History Narrative   Lives in [place]   Works at [employer] and [place]   Married   Social Determinants of Health   Financial Resource Strain: Low Risk  (10/21/2021)   Overall Financial Resource Strain (CARDIA)    Difficulty of Paying Living Expenses: Not hard at all  Food Insecurity: No Food Insecurity (10/21/2021)   Hunger Vital Sign    Worried About Running Out of Food in the Last Year: Never true    Ran Out of Food in the Last Year: Never true  Transportation Needs: No Transportation Needs (10/21/2021)   PRAPARE -  Administrator, Civil Service (Medical): No    Lack of Transportation (Non-Medical): No  Physical Activity: Sufficiently Active (10/21/2021)   Exercise Vital Sign    Days of Exercise per Week: 4 days    Minutes of Exercise per Session: 40 min  Stress: No Stress Concern Present (10/21/2021)   [employer] of Occupational Health - Occupational Stress Questionnaire    Feeling of Stress : Not at all  Social Connections: Moderately Isolated (10/21/2021)   Social Connection and Isolation Panel [NHANES]    Frequency of Communication with Friends and Family: More than three times a week    Frequency of Social Gatherings with Friends and Family: More than three times a week    Attends Religious Services: Never    Database administrator or Organizations: No    Attends Banker Meetings: Never    Marital Status: Married    Review of Systems Per HPI  Objective:  BP 124/75   Ht 6' (1.829 m)   Wt 189 lb 12.8 oz (86.1 kg)   BMI 25.74 kg/m      06/24/2022    3:42 PM 12/29/2021    8:12 AM 10/21/2021    8:34 AM  BP/Weight  Systolic BP 124 110   Diastolic BP 75 64   Wt. (Lbs) 189.8 202.6 203  BMI 25.74 kg/m2 27.48 kg/m2 27.53 kg/m2    Physical Exam Vitals and nursing note reviewed.  Constitutional:      Appearance: Normal appearance.  HENT:     Head: Normocephalic and atraumatic.  Cardiovascular:     Rate and Rhythm: Normal rate and regular rhythm.  Pulmonary:     Effort: Pulmonary effort is normal.     Breath sounds: Normal breath sounds.  Skin:    Comments: Right flank and low back with ~ 3 papules. Minimal redness. No fluctuance. No visible erythema migrans.   Neurological:     Mental Status: He is alert.  Psychiatric:        Mood and Affect: Mood normal.        Behavior: Behavior normal.     Lab Results  Component Value Date   WBC 8.0 06/29/2021   HGB 15.3 06/29/2021   HCT 45.0 06/29/2021   PLT 182 06/29/2021   GLUCOSE 96 06/29/2021   CHOL  105 06/29/2021   TRIG 86 06/29/2021   HDL 34 (L) 06/29/2021   LDLCALC 54 06/29/2021   ALT 23 06/29/2021   AST 21 06/29/2021   NA 142 06/29/2021   K 4.3 06/29/2021   CL 103 06/29/2021   CREATININE 1.26 06/29/2021   BUN 13 06/29/2021   CO2 27 06/29/2021   TSH 2.720 04/08/2016   PSA 0.7 03/29/2014     Assessment & Plan:   Problem List Items Addressed This Visit       Musculoskeletal and Integument   Tick bite - Primary    No evidence of tick borne illness or infection. Topical triamcinolone as directed.        Meds ordered this encounter  Medications   triamcinolone ointment (KENALOG) 0.5 %    Sig: Apply 1 Application topically 2 (two) times daily.    Dispense:  30 g    Refill:  0    Mohamadou Maciver DO [facility]

## 2022-06-30 ENCOUNTER — Ambulatory Visit: Payer: Medicare Other | Admitting: Family Medicine

## 2022-07-06 ENCOUNTER — Ambulatory Visit (INDEPENDENT_AMBULATORY_CARE_PROVIDER_SITE_OTHER): Payer: Medicare Other | Admitting: Family Medicine

## 2022-07-06 VITALS — BP 132/78 | HR 56 | Temp 97.9°F | Ht 72.0 in | Wt 190.0 lb

## 2022-07-06 DIAGNOSIS — E7800 Pure hypercholesterolemia, unspecified: Secondary | ICD-10-CM | POA: Diagnosis not present

## 2022-07-06 DIAGNOSIS — M1A9XX0 Chronic gout, unspecified, without tophus (tophi): Secondary | ICD-10-CM

## 2022-07-06 DIAGNOSIS — R1900 Intra-abdominal and pelvic swelling, mass and lump, unspecified site: Secondary | ICD-10-CM | POA: Insufficient documentation

## 2022-07-06 DIAGNOSIS — I10 Essential (primary) hypertension: Secondary | ICD-10-CM | POA: Diagnosis not present

## 2022-07-06 DIAGNOSIS — I4819 Other persistent atrial fibrillation: Secondary | ICD-10-CM

## 2022-07-06 DIAGNOSIS — Z125 Encounter for screening for malignant neoplasm of prostate: Secondary | ICD-10-CM

## 2022-07-06 MED ORDER — ALBUTEROL SULFATE HFA 108 (90 BASE) MCG/ACT IN AERS: 2.0000 | INHALATION_SPRAY | Freq: Four times a day (QID) | RESPIRATORY_TRACT | 2 refills | Status: DC | PRN

## 2022-07-06 NOTE — Patient Instructions (Signed)
Labs ordered.  Referral placed.  Follow up in 6 months.

## 2022-07-06 NOTE — Assessment & Plan Note (Signed)
Referring to general surgery. 

## 2022-07-06 NOTE — Assessment & Plan Note (Signed)
Stable.  Continue current medication

## 2022-07-06 NOTE — Progress Notes (Signed)
Subjective:  Patient ID: Anthony [name], male    DOB: May 21, 1955  Age: 67 y.o. MRN: [id number]  CC: Chief Complaint  Patient presents with   Hypertension    6 month follow up   Hyperlipidemia    HPI:  67 year old male with the below mentioned medical problems presents for follow-up.  Patient has an upcoming appointment with cardiology.  He is currently only on rate control with metoprolol regarding atrial fib.  He is not on anticoagulation.  Patient's hypertension is stable on lisinopril, HCTZ, metoprolol, and verapamil.  Lipids have been well-controlled on Crestor.  Last LDL was 54.  Patient needs labs today.  Patient does note that he has had some discomfort in the left lower abdomen/left groin.  He states that he has had a "knot in the area".  Comes and goes.  He states that his wife advised him to discuss it with me.  No pain in the testicles.  No swelling in the testicles.  Patient Active Problem List   Diagnosis Date Noted   Abdominal wall bulge 07/06/2022   Trigger finger 12/29/2021   RLS (restless legs syndrome) 04/21/2021   Gout 12/24/2020   Erectile dysfunction 12/24/2020   Anaphylactic shock due to adverse food reaction 06/02/2017   OSA on CPAP    Hyperlipidemia    History of kidney stones    GERD (gastroesophageal reflux disease)    Persistent atrial fibrillation (HCC)    Essential hypertension, benign 06/02/2012    Social Hx   Social History   Socioeconomic History   Marital status: Married    Spouse name: Not on file   Number of children: Not on file   Years of education: Not on file   Highest education level: Not on file  Occupational History   Not on file  Tobacco Use   Smoking status: Never   Smokeless tobacco: Never  Vaping Use   Vaping Use: Never used  Substance and Sexual Activity   Alcohol use: No   Drug use: No   Sexual activity: Yes  Other Topics Concern   Not on file  Social History Narrative   Lives in [place]   Works at  [employer] and [place]   Married   Social Determinants of Health   Financial Resource Strain: Low Risk  (10/21/2021)   Overall Financial Resource Strain (CARDIA)    Difficulty of Paying Living Expenses: Not hard at all  Food Insecurity: No Food Insecurity (10/21/2021)   Hunger Vital Sign    Worried About Running Out of Food in the Last Year: Never true    Ran Out of Food in the Last Year: Never true  Transportation Needs: No Transportation Needs (10/21/2021)   PRAPARE - Administrator, Civil Service (Medical): No    Lack of Transportation (Non-Medical): No  Physical Activity: Sufficiently Active (10/21/2021)   Exercise Vital Sign    Days of Exercise per Week: 4 days    Minutes of Exercise per Session: 40 min  Stress: No Stress Concern Present (10/21/2021)   [employer] of Occupational Health - Occupational Stress Questionnaire    Feeling of Stress : Not at all  Social Connections: Moderately Isolated (10/21/2021)   Social Connection and Isolation Panel [NHANES]    Frequency of Communication with Friends and Family: More than three times a week    Frequency of Social Gatherings with Friends and Family: More than three times a week    Attends Religious Services: Never  Active Member of Clubs or Organizations: No    Attends Banker Meetings: Never    Marital Status: Married    Review of Systems Per HPI  Objective:  BP 132/78   Pulse (!) 56   Temp 97.9 F (36.6 C)   Ht 6' (1.829 m)   Wt 190 lb (86.2 kg)   SpO2 99%   BMI 25.77 kg/m      07/06/2022    9:29 AM 06/24/2022    3:42 PM 12/29/2021    8:12 AM  BP/Weight  Systolic BP 132 124 110  Diastolic BP 78 75 64  Wt. (Lbs) 190 189.8 202.6  BMI 25.77 kg/m2 25.74 kg/m2 27.48 kg/m2    Physical Exam Vitals and nursing note reviewed.  Constitutional:      General: He is not in acute distress.    Appearance: Normal appearance.  HENT:     Head: Normocephalic and atraumatic.  Eyes:     General:         Right eye: No discharge.        Left eye: No discharge.     Conjunctiva/sclera: Conjunctivae normal.  Cardiovascular:     Rate and Rhythm: Normal rate and regular rhythm.  Pulmonary:     Effort: Pulmonary effort is normal.     Breath sounds: Normal breath sounds. No wheezing or rales.  Abdominal:     General: There is no distension.     Palpations: Abdomen is soft.       Comments: Bulge noted in this region.  Concerning for hernia.  Neurological:     Mental Status: He is alert.  Psychiatric:        Mood and Affect: Mood normal.        Behavior: Behavior normal.     Lab Results  Component Value Date   WBC 8.0 06/29/2021   HGB 15.3 06/29/2021   HCT 45.0 06/29/2021   PLT 182 06/29/2021   GLUCOSE 96 06/29/2021   CHOL 105 06/29/2021   TRIG 86 06/29/2021   HDL 34 (L) 06/29/2021   LDLCALC 54 06/29/2021   ALT 23 06/29/2021   AST 21 06/29/2021   NA 142 06/29/2021   K 4.3 06/29/2021   CL 103 06/29/2021   CREATININE 1.26 06/29/2021   BUN 13 06/29/2021   CO2 27 06/29/2021   TSH 2.720 04/08/2016   PSA 0.7 03/29/2014     Assessment & Plan:   Problem List Items Addressed This Visit       Cardiovascular and Mediastinum   Persistent atrial fibrillation (HCC)    Continue current medication.  Has follow-up with cardiology.      Relevant Orders   CBC   Essential hypertension, benign - Primary    Stable.  Continue current medication.      Relevant Orders   CMP14+EGFR   Microalbumin / creatinine urine ratio     Other   Gout   Relevant Orders   Uric Acid   Hyperlipidemia    Doing well on Crestor.  Continue.  Lipid panel today.      Relevant Orders   Lipid panel   Abdominal wall bulge    Referring to general surgery.      Relevant Orders   Ambulatory referral to General Surgery   Other Visit Diagnoses     Prostate cancer screening       Relevant Orders   PSA       Meds ordered this encounter  Medications  albuterol (VENTOLIN HFA) 108 (90  Base) MCG/ACT inhaler    Sig: Inhale 2 puffs into the lungs every 6 (six) hours as needed for wheezing or shortness of breath. INHALE 2 PUFFS BY MOUTH FOUR TIMES DAILY AS NEEDED FOR WHEEZING    Dispense:  18 g    Refill:  2    Follow-up:  Return in about 6 months (around 01/05/2023).  Everlene Other DO [facility]

## 2022-07-06 NOTE — Assessment & Plan Note (Signed)
Continue current medication.  Has follow-up with cardiology.

## 2022-07-06 NOTE — Assessment & Plan Note (Signed)
Doing well on Crestor.  Continue.  Lipid panel today.

## 2022-07-16 DIAGNOSIS — I10 Essential (primary) hypertension: Secondary | ICD-10-CM | POA: Diagnosis not present

## 2022-07-16 DIAGNOSIS — E7800 Pure hypercholesterolemia, unspecified: Secondary | ICD-10-CM | POA: Diagnosis not present

## 2022-07-16 DIAGNOSIS — M1A9XX0 Chronic gout, unspecified, without tophus (tophi): Secondary | ICD-10-CM | POA: Diagnosis not present

## 2022-07-16 DIAGNOSIS — I4819 Other persistent atrial fibrillation: Secondary | ICD-10-CM | POA: Diagnosis not present

## 2022-07-17 LAB — LIPID PANEL
Chol/HDL Ratio: 2.8 ratio (ref 0.0–5.0)
Cholesterol, Total: 109 mg/dL (ref 100–199)
HDL: 39 mg/dL — ABNORMAL LOW (ref >39)
LDL Chol Calc (NIH): 56 mg/dL (ref 0–99)
Triglycerides: 61 mg/dL (ref 0–149)
VLDL Cholesterol Cal: 14 mg/dL (ref 5–40)

## 2022-07-17 LAB — CMP14+EGFR
ALT: 24 IU/L (ref 0–44)
AST: 22 IU/L (ref 0–40)
Albumin: 4.4 g/dL (ref 3.9–4.9)
Alkaline Phosphatase: 57 IU/L (ref 44–121)
BUN/Creatinine Ratio: 9 — ABNORMAL LOW (ref 10–24)
BUN: 11 mg/dL (ref 8–27)
Bilirubin Total: 0.5 mg/dL (ref 0.0–1.2)
CO2: 25 mmol/L (ref 20–29)
Calcium: 9.3 mg/dL (ref 8.6–10.2)
Chloride: 107 mmol/L — ABNORMAL HIGH (ref 96–106)
Creatinine, Ser: 1.21 mg/dL (ref 0.76–1.27)
Globulin, Total: 1.9 g/dL (ref 1.5–4.5)
Glucose: 91 mg/dL (ref 70–99)
Potassium: 4.1 mmol/L (ref 3.5–5.2)
Sodium: 144 mmol/L (ref 134–144)
Total Protein: 6.3 g/dL (ref 6.0–8.5)
eGFR: 66 mL/min/{1.73_m2} (ref >59)

## 2022-07-17 LAB — CBC
Hematocrit: 40.9 % (ref 37.5–51.0)
Hemoglobin: 13.8 g/dL (ref 13.0–17.7)
MCH: 30.8 pg (ref 26.6–33.0)
MCHC: 33.7 g/dL (ref 31.5–35.7)
MCV: 91 fL (ref 79–97)
Platelets: 153 10*3/uL (ref 150–450)
RBC: 4.48 x10E6/uL (ref 4.14–5.80)
RDW: 12.2 % (ref 11.6–15.4)
WBC: 6.1 10*3/uL (ref 3.4–10.8)

## 2022-07-17 LAB — PSA: Prostate Specific Ag, Serum: 0.8 ng/mL (ref 0.0–4.0)

## 2022-07-17 LAB — MICROALBUMIN / CREATININE URINE RATIO
Creatinine, Urine: 193 mg/dL
Microalb/Creat Ratio: 6 mg/g creat (ref 0–29)
Microalbumin, Urine: 11.4 ug/mL

## 2022-07-17 LAB — URIC ACID: Uric Acid: 5.5 mg/dL (ref 3.8–8.4)

## 2022-07-18 ENCOUNTER — Other Ambulatory Visit: Payer: Self-pay | Admitting: Family Medicine

## 2022-07-18 DIAGNOSIS — I10 Essential (primary) hypertension: Secondary | ICD-10-CM

## 2022-07-21 ENCOUNTER — Other Ambulatory Visit: Payer: Self-pay | Admitting: Family Medicine

## 2022-07-21 DIAGNOSIS — K219 Gastro-esophageal reflux disease without esophagitis: Secondary | ICD-10-CM

## 2022-07-23 ENCOUNTER — Encounter: Payer: Medicare Other | Admitting: Internal Medicine

## 2022-07-23 ENCOUNTER — Ambulatory Visit: Payer: Medicare Other | Attending: Cardiovascular Disease | Admitting: Cardiovascular Disease

## 2022-07-23 VITALS — BP 158/82 | HR 82 | Ht 72.0 in | Wt 192.8 lb

## 2022-07-23 DIAGNOSIS — I4819 Other persistent atrial fibrillation: Secondary | ICD-10-CM | POA: Diagnosis not present

## 2022-07-23 NOTE — Progress Notes (Signed)
   PCP: Anthony Sams, DO   Primary EP: Dr Nelly Laurence  Anthony [name] is a 67 y.o. male who presents today for routine electrophysiology followup.  Since last being seen in our clinic, the patient reports doing very well.    He has a history of persistent atrial fibrillation and underwent AF ablation by Dr. Johney Frame in July 2018.  At that time, he was also noted to be in typical atrial flutter, so flutter ablation was performed as well.  Prior to the ablation, he experienced palpitations during episodes of atrial fibrillation.  Since the ablation, he has not had recurrence of palpitations.  He has an Scientist, physiological and monitors his rhythm.  Today, he denies symptoms of palpitations, chest pain, shortness of breath,  lower extremity edema, dizziness, presyncope, or syncope.  The patient is otherwise without complaint today.   Physical Exam: Vitals:   07/23/22 1520  BP: (!) 158/82  Pulse: 82  SpO2: 96%  Weight: 192 lb 12.8 oz (87.5 kg)  Height: 6' (1.829 m)    Wt Readings from Last 3 Encounters:  07/23/22 192 lb 12.8 oz (87.5 kg)  07/06/22 190 lb (86.2 kg)  06/24/22 189 lb 12.8 oz (86.1 kg)   Gen: Appears comfortable, well-nourished CV: RRR, no dependent edema Pulm: breathing easily  EKG tracing ordered today is personally reviewed and shows sinus rhythm 81 bpm, LAFB  Assessment and Plan:  Paroxysmal atrial fibrillation Well controlled He is monitoring his heart rhythm with an apple watch. He has not had any detections. He discontinued Eliquis, which is reasonable given that he has not had recurrence of atrial fibrillation and is monitoring his rhythm with the device.  2. HTN Stable No change required today  3. OSA He is not compliant with CPAP  Return in a year  [name], MD 07/23/2022 3:29 PM

## 2022-07-23 NOTE — Patient Instructions (Signed)

## 2022-07-27 ENCOUNTER — Ambulatory Visit: Payer: Medicare Other | Admitting: Surgery

## 2022-07-27 ENCOUNTER — Encounter: Payer: Self-pay | Admitting: Surgery

## 2022-07-27 VITALS — BP 142/78 | HR 61 | Temp 97.8°F | Resp 12 | Ht 72.0 in | Wt 191.0 lb

## 2022-07-27 DIAGNOSIS — R1900 Intra-abdominal and pelvic swelling, mass and lump, unspecified site: Secondary | ICD-10-CM

## 2022-07-27 MED ORDER — DIPHENHYDRAMINE HCL 50 MG PO CAPS: ORAL_CAPSULE | ORAL | 0 refills | Status: DC

## 2022-07-27 MED ORDER — PREDNISONE 50 MG PO TABS: ORAL_TABLET | ORAL | 0 refills | Status: DC
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
## 2022-08-03 ENCOUNTER — Ambulatory Visit (HOSPITAL_BASED_OUTPATIENT_CLINIC_OR_DEPARTMENT_OTHER)
Admission: RE | Admit: 2022-08-03 | Discharge: 2022-08-03 | Disposition: A | Discharge status: Home / Self Care | Payer: Medicare Other | Source: Ambulatory Visit | Attending: Surgery | Admitting: Surgery

## 2022-08-03 ENCOUNTER — Encounter (HOSPITAL_BASED_OUTPATIENT_CLINIC_OR_DEPARTMENT_OTHER): Payer: Self-pay

## 2022-08-03 DIAGNOSIS — R1900 Intra-abdominal and pelvic swelling, mass and lump, unspecified site: Secondary | ICD-10-CM | POA: Diagnosis not present

## 2022-08-03 DIAGNOSIS — K8020 Calculus of gallbladder without cholecystitis without obstruction: Secondary | ICD-10-CM | POA: Diagnosis not present

## 2022-08-03 DIAGNOSIS — K4090 Unilateral inguinal hernia, without obstruction or gangrene, not specified as recurrent: Secondary | ICD-10-CM | POA: Diagnosis not present

## 2022-08-03 DIAGNOSIS — N200 Calculus of kidney: Secondary | ICD-10-CM | POA: Diagnosis not present

## 2022-08-03 MED ORDER — IOHEXOL 300 MG/ML  SOLN
100.0000 mL | Freq: Once | INTRAMUSCULAR | Status: AC | PRN
  Administered 2022-08-03: 100 mL via INTRAVENOUS

## 2022-08-04 ENCOUNTER — Other Ambulatory Visit: Payer: Self-pay | Admitting: Cardiovascular Disease

## 2022-08-11 ENCOUNTER — Other Ambulatory Visit: Payer: Self-pay | Admitting: *Deleted

## 2022-08-11 DIAGNOSIS — K4090 Unilateral inguinal hernia, without obstruction or gangrene, not specified as recurrent: Secondary | ICD-10-CM

## 2022-08-11 DIAGNOSIS — R1900 Intra-abdominal and pelvic swelling, mass and lump, unspecified site: Secondary | ICD-10-CM

## 2022-08-13 ENCOUNTER — Telehealth: Payer: Self-pay | Admitting: Surgery

## 2022-08-13 DIAGNOSIS — K4090 Unilateral inguinal hernia, without obstruction or gangrene, not specified as recurrent: Secondary | ICD-10-CM

## 2022-08-13 NOTE — Telephone Encounter (Signed)
[facility]  Called to update the patient regarding his CT results.  I explained that the CT of the abdomen pelvis is demonstrating a small fat-containing left inguinal, cholelithiasis, and a right nonobstructive renal calculus.  Given the finding of left inguinal hernia, we discussed his options for this, including surgery.  The risks and benefits of robotic assisted laparoscopic left inguinal hernia repair with mesh were discussed with the patient, including but not limited to bleeding, infection, injury to surrounding structures, need for additional procedures, and hernia recurrence.  After careful consideration, Anthony [name] has decided to proceed with surgical repair of his inguinal hernia.  We have tentatively scheduled him for surgery on 8/9.  My office will call him regarding dates of his preoperative appointment.  Advised that he needs to present to the emergency department if he begins to have nausea, vomiting, reducible left groin bulge, and obstipation.  This patient was made aware that this is a billable encounter.  A total of 10 minutes was spent on the phone with the patient.  All questions were answered to his expressed satisfaction.  Theophilus Kinds, DO [facility] [address] [place], [place] [address] [phone] (office)

## 2022-08-17 ENCOUNTER — Other Ambulatory Visit: Payer: Self-pay | Admitting: Family Medicine

## 2022-08-24 NOTE — Patient Instructions (Addendum)
ZIGMOND DISCH  08/24/2022     @PREFPERIOPPHARMACY @   Your procedure is scheduled on  08/27/2022.   Report to [facility] at  [phone] A.M.   Call this number if you have problems the morning of surgery:  [phone]  If you experience any cold or flu symptoms such as cough, fever, chills, shortness of breath, etc. between now and your scheduled surgery, please notify us at the above number.   Remember:  Do not eat or drink after midnight.        Use your inhaler before you come and bring your rescue inhaler with you.     Take these medicines the morning of surgery with A SIP OF WATER                       allopurinol, metoprolol, pantoprazole.     Do not wear jewelry, make-up or nail polish, including gel polish,  artificial nails, or any other type of covering on natural nails (fingers and  toes).  Do not wear lotions, powders, or perfumes, or deodorant.  Do not shave 48 hours prior to surgery.  Men may shave face and neck.  Do not bring valuables to the hospital.  [facility] is not responsible for any belongings or valuables.  Contacts, dentures or bridgework may not be worn into surgery.  Leave your suitcase in the car.  After surgery it may be brought to your room.  For patients admitted to the hospital, discharge time will be determined by your treatment team.  Patients discharged the day of surgery will not be allowed to drive home and must have someone with them for 24 hours.    Special instructions:   DO NOT smoke tobacco or vape for 24 hours before your procedure.  Please read over the following fact sheets that you were given. Anesthesia Post-op Instructions and Care and Recovery After Surgery       Laparoscopic Inguinal Hernia Repair, Adult, Care After The following information offers guidance on how to care for yourself after your procedure. Your health care provider may also give you more specific instructions. If you have problems or  questions, contact your health care provider. What can I expect after the procedure? After the procedure, it is common to have: Pain. Swelling and bruising around the incision area. Scrotal swelling, in males. Some fluid or blood draining from your incisions. Follow these instructions at home: Medicines Take over-the-counter and prescription medicines only as told by your health care provider. Ask your health care provider if the medicine prescribed to you: Requires you to avoid driving or using machinery. Can cause constipation. You may need to take these actions to prevent or treat constipation: Drink enough fluid to keep your urine pale yellow. Take over-the-counter or prescription medicines. Eat foods that are high in fiber, such as beans, whole grains, and fresh fruits and vegetables. Limit foods that are high in fat and processed sugars, such as fried or sweet foods. Incision care  Follow instructions from your health care provider about how to take care of your incisions. Make sure you: Wash your hands with soap and water for at least 20 seconds before and after you change your bandage (dressing). If soap and water are not available, use hand sanitizer. Change your dressing as told by your health care provider. Leave stitches (sutures), skin glue, or adhesive strips in place. These skin closures may need to  stay in place for 2 weeks or longer. If adhesive strip edges start to loosen and curl up, you may trim the loose edges. Do not remove adhesive strips completely unless your health care provider tells you to do that. Check your incision area every day for signs of infection. Check for: More redness, swelling, or pain. More fluid or blood. Warmth. Pus or a bad smell. Wear loose, soft clothing while your incisions heal. Managing pain and swelling If directed, put ice on the painful or swollen areas. To do this: Put ice in a plastic bag. Place a towel between your skin and the  bag. Leave the ice on for 20 minutes, 2-3 times a day. Remove the ice if your skin turns bright red. This is very important. If you cannot feel pain, heat, or cold, you have a greater risk of damage to the area.  Activity Do not lift anything that is heavier than 10 lb (4.5 kg), or the limit that you are told, until your health care provider says that it is safe. Ask your health care provider what activities are safe for you. A lot of activity during the first week after surgery can increase pain and swelling. For 1 week after your procedure: Avoid activities that take a lot of effort, such as exercise or sports. You may walk and climb stairs as needed for daily activity, but avoid long walks or climbing stairs for exercise. General instructions If you were given a sedative during the procedure, it can affect you for several hours. Do not drive or operate machinery until your health care provider says that it is safe. Do not take baths, swim, or use a hot tub until your health care provider approves. Ask your health care provider if you may take showers. You may only be allowed to take sponge baths. Do not use any products that contain nicotine or tobacco. These products include cigarettes, chewing tobacco, and vaping devices, such as e-cigarettes. If you need help quitting, ask your health care provider. Keep all follow-up visits. This is important. Contact a health care provider if: You have any of these signs of infection: More redness, swelling, or pain around your incisions or your groin area. More fluid or blood coming from an incision. Warmth coming from an incision. Pus or a bad smell coming from an incision. A fever or chills. You have more swelling in your scrotum, if you are male. You have severe pain and medicines do not help. You have abdominal pain or swelling. You cannot urinate or have a bowel movement. You faint or feel dizzy. You have nausea and vomiting. Get help right  away if: You have redness, warmth, or pain in your leg. You have chest pain. You have problems breathing. These symptoms may represent a serious problem that is an emergency. Do not wait to see if the symptoms will go away. Get medical help right away. Call your local emergency services (911 in the [place] Do not drive yourself to the hospital. Summary Pain, swelling, and bruising are common after the procedure. Check your incision area every day for signs of infection, such as more redness, swelling, or pain. Put ice on painful or swollen areas for 20 minutes, 2-3 times a day. This information is not intended to replace advice given to you by your health care provider. Make sure you discuss any questions you have with your health care provider. Document Revised: 09/04/2019 Document Reviewed: 09/04/2019 Elsevier Patient Education  2024 [employer]. General  Anesthesia, Adult, Care After The following information offers guidance on how to care for yourself after your procedure. Your health care provider may also give you more specific instructions. If you have problems or questions, contact your health care provider. What can I expect after the procedure? After the procedure, it is common for people to: Have pain or discomfort at the IV site. Have nausea or vomiting. Have a sore throat or hoarseness. Have trouble concentrating. Feel cold or chills. Feel weak, sleepy, or tired (fatigue). Have soreness and body aches. These can affect parts of the body that were not involved in surgery. Follow these instructions at home: For the time period you were told by your health care provider:  Rest. Do not participate in activities where you could fall or become injured. Do not drive or use machinery. Do not drink alcohol. Do not take sleeping pills or medicines that cause drowsiness. Do not make important decisions or sign legal documents. Do not take care of children on your own. General  instructions Drink enough fluid to keep your urine pale yellow. If you have sleep apnea, surgery and certain medicines can increase your risk for breathing problems. Follow instructions from your health care provider about wearing your sleep device: Anytime you are sleeping, including during daytime naps. While taking prescription pain medicines, sleeping medicines, or medicines that make you drowsy. Return to your normal activities as told by your health care provider. Ask your health care provider what activities are safe for you. Take over-the-counter and prescription medicines only as told by your health care provider. Do not use any products that contain nicotine or tobacco. These products include cigarettes, chewing tobacco, and vaping devices, such as e-cigarettes. These can delay incision healing after surgery. If you need help quitting, ask your health care provider. Contact a health care provider if: You have nausea or vomiting that does not get better with medicine. You vomit every time you eat or drink. You have pain that does not get better with medicine. You cannot urinate or have bloody urine. You develop a skin rash. You have a fever. Get help right away if: You have trouble breathing. You have chest pain. You vomit blood. These symptoms may be an emergency. Get help right away. Call 911. Do not wait to see if the symptoms will go away. Do not drive yourself to the hospital. Summary After the procedure, it is common to have a sore throat, hoarseness, nausea, vomiting, or to feel weak, sleepy, or fatigue. For the time period you were told by your health care provider, do not drive or use machinery. Get help right away if you have difficulty breathing, have chest pain, or vomit blood. These symptoms may be an emergency. This information is not intended to replace advice given to you by your health care provider. Make sure you discuss any questions you have with your health  care provider. Document Revised: 04/03/2021 Document Reviewed: 04/03/2021 Elsevier Patient Education  2024 [employer] How to Use Chlorhexidine Before Surgery Chlorhexidine gluconate (CHG) is a germ-killing (antiseptic) solution that is used to clean the skin. It can get rid of the bacteria that normally live on the skin and can keep them away for about 24 hours. To clean your skin with CHG, you may be given: A CHG solution to use in the shower or as part of a sponge bath. A prepackaged cloth that contains CHG. Cleaning your skin with CHG may help lower the risk for infection: While you  are staying in the intensive care unit of the hospital. If you have a vascular access, such as a central line, to provide short-term or long-term access to your veins. If you have a catheter to drain urine from your bladder. If you are on a ventilator. A ventilator is a machine that helps you breathe by moving air in and out of your lungs. After surgery. What are the risks? Risks of using CHG include: A skin reaction. Hearing loss, if CHG gets in your ears and you have a perforated eardrum. Eye injury, if CHG gets in your eyes and is not rinsed out. The CHG product catching fire. Make sure that you avoid smoking and flames after applying CHG to your skin. Do not use CHG: If you have a chlorhexidine allergy or have previously reacted to chlorhexidine. On babies younger than 65 months of age. How to use CHG solution Use CHG only as told by your health care provider, and follow the instructions on the label. Use the full amount of CHG as directed. Usually, this is one bottle. During a shower Follow these steps when using CHG solution during a shower (unless your health care provider gives you different instructions): Start the shower. Use your normal soap and shampoo to wash your face and hair. Turn off the shower or move out of the shower stream. Pour the CHG onto a clean washcloth. Do not use any type  of brush or rough-edged sponge. Starting at your neck, lather your body down to your toes. Make sure you follow these instructions: If you will be having surgery, pay special attention to the part of your body where you will be having surgery. Scrub this area for at least 1 minute. Do not use CHG on your head or face. If the solution gets into your ears or eyes, rinse them well with water. Avoid your genital area. Avoid any areas of skin that have broken skin, cuts, or scrapes. Scrub your back and under your arms. Make sure to wash skin folds. Let the lather sit on your skin for 1-2 minutes or as long as told by your health care provider. Thoroughly rinse your entire body in the shower. Make sure that all body creases and crevices are rinsed well. Dry off with a clean towel. Do not put any substances on your body afterward--such as powder, lotion, or perfume--unless you are told to do so by your health care provider. Only use lotions that are recommended by the manufacturer. Put on clean clothes or pajamas. If it is the night before your surgery, sleep in clean sheets.  During a sponge bath Follow these steps when using CHG solution during a sponge bath (unless your health care provider gives you different instructions): Use your normal soap and shampoo to wash your face and hair. Pour the CHG onto a clean washcloth. Starting at your neck, lather your body down to your toes. Make sure you follow these instructions: If you will be having surgery, pay special attention to the part of your body where you will be having surgery. Scrub this area for at least 1 minute. Do not use CHG on your head or face. If the solution gets into your ears or eyes, rinse them well with water. Avoid your genital area. Avoid any areas of skin that have broken skin, cuts, or scrapes. Scrub your back and under your arms. Make sure to wash skin folds. Let the lather sit on your skin for 1-2 minutes or as long  as told by  your health care provider. Using a different clean, wet washcloth, thoroughly rinse your entire body. Make sure that all body creases and crevices are rinsed well. Dry off with a clean towel. Do not put any substances on your body afterward--such as powder, lotion, or perfume--unless you are told to do so by your health care provider. Only use lotions that are recommended by the manufacturer. Put on clean clothes or pajamas. If it is the night before your surgery, sleep in clean sheets. How to use CHG prepackaged cloths Only use CHG cloths as told by your health care provider, and follow the instructions on the label. Use the CHG cloth on clean, dry skin. Do not use the CHG cloth on your head or face unless your health care provider tells you to. When washing with the CHG cloth: Avoid your genital area. Avoid any areas of skin that have broken skin, cuts, or scrapes. Before surgery Follow these steps when using a CHG cloth to clean before surgery (unless your health care provider gives you different instructions): Using the CHG cloth, vigorously scrub the part of your body where you will be having surgery. Scrub using a back-and-forth motion for 3 minutes. The area on your body should be completely wet with CHG when you are done scrubbing. Do not rinse. Discard the cloth and let the area air-dry. Do not put any substances on the area afterward, such as powder, lotion, or perfume. Put on clean clothes or pajamas. If it is the night before your surgery, sleep in clean sheets.  For general bathing Follow these steps when using CHG cloths for general bathing (unless your health care provider gives you different instructions). Use a separate CHG cloth for each area of your body. Make sure you wash between any folds of skin and between your fingers and toes. Wash your body in the following order, switching to a new cloth after each step: The front of your neck, shoulders, and chest. Both of your  arms, under your arms, and your hands. Your stomach and groin area, avoiding the genitals. Your right leg and foot. Your left leg and foot. The back of your neck, your back, and your buttocks. Do not rinse. Discard the cloth and let the area air-dry. Do not put any substances on your body afterward--such as powder, lotion, or perfume--unless you are told to do so by your health care provider. Only use lotions that are recommended by the manufacturer. Put on clean clothes or pajamas. Contact a health care provider if: Your skin gets irritated after scrubbing. You have questions about using your solution or cloth. You swallow any chlorhexidine. Call your local poison control center [phone] in the [place] Get help right away if: Your eyes itch badly, or they become very red or swollen. Your skin itches badly and is red or swollen. Your hearing changes. You have trouble seeing. You have swelling or tingling in your mouth or throat. You have trouble breathing. These symptoms may represent a serious problem that is an emergency. Do not wait to see if the symptoms will go away. Get medical help right away. Call your local emergency services (911 in the [place] Do not drive yourself to the hospital. Summary Chlorhexidine gluconate (CHG) is a germ-killing (antiseptic) solution that is used to clean the skin. Cleaning your skin with CHG may help to lower your risk for infection. You may be given CHG to use for bathing. It may be in a bottle or  in a prepackaged cloth to use on your skin. Carefully follow your health care provider's instructions and the instructions on the product label. Do not use CHG if you have a chlorhexidine allergy. Contact your health care provider if your skin gets irritated after scrubbing. This information is not intended to replace advice given to you by your health care provider. Make sure you discuss any questions you have with your health care provider. Document  Revised: 05/04/2021 Document Reviewed: 03/17/2020 Elsevier Patient Education  2023 [employer].

## 2022-08-25 ENCOUNTER — Encounter (HOSPITAL_COMMUNITY)
Admission: RE | Admit: 2022-08-25 | Discharge: 2022-08-25 | Disposition: A | Discharge status: Home / Self Care | Payer: Medicare Other | Source: Ambulatory Visit | Attending: Surgery | Admitting: Surgery

## 2022-08-25 VITALS — BP 167/82 | HR 60 | Temp 97.6°F | Resp 18 | Ht 72.0 in | Wt 190.9 lb

## 2022-08-25 DIAGNOSIS — Z79899 Other long term (current) drug therapy: Secondary | ICD-10-CM | POA: Insufficient documentation

## 2022-08-25 DIAGNOSIS — Z01812 Encounter for preprocedural laboratory examination: Secondary | ICD-10-CM | POA: Diagnosis not present

## 2022-08-25 DIAGNOSIS — K4090 Unilateral inguinal hernia, without obstruction or gangrene, not specified as recurrent: Secondary | ICD-10-CM | POA: Diagnosis not present

## 2022-08-25 LAB — BASIC METABOLIC PANEL
Anion gap: 7 (ref 5–15)
BUN: 15 mg/dL (ref 8–23)
CO2: 28 mmol/L (ref 22–32)
Calcium: 9.1 mg/dL (ref 8.9–10.3)
Chloride: 105 mmol/L (ref 98–111)
Creatinine, Ser: 1.12 mg/dL (ref 0.61–1.24)
GFR, Estimated: >60 mL/min (ref >60)
Glucose, Bld: 80 mg/dL (ref 70–99)
Potassium: 3.9 mmol/L (ref 3.5–5.1)
Sodium: 140 mmol/L (ref 135–145)

## 2022-08-27 ENCOUNTER — Ambulatory Visit (HOSPITAL_COMMUNITY)
Admission: RE | Admit: 2022-08-27 | Discharge: 2022-08-27 | Disposition: A | Discharge status: Home / Self Care | Payer: Medicare Other | Source: Home / Self Care | Attending: Surgery | Admitting: Surgery

## 2022-08-27 ENCOUNTER — Encounter (HOSPITAL_COMMUNITY): Payer: Self-pay | Admitting: Surgery

## 2022-08-27 ENCOUNTER — Ambulatory Visit (HOSPITAL_COMMUNITY): Payer: Medicare Other | Admitting: Anesthesiology

## 2022-08-27 ENCOUNTER — Ambulatory Visit (HOSPITAL_BASED_OUTPATIENT_CLINIC_OR_DEPARTMENT_OTHER): Payer: Medicare Other | Admitting: Anesthesiology

## 2022-08-27 ENCOUNTER — Encounter (HOSPITAL_COMMUNITY)
Admission: RE | Disposition: A | Discharge status: Home / Self Care | Payer: Self-pay | Source: Home / Self Care | Attending: Surgery

## 2022-08-27 DIAGNOSIS — K4090 Unilateral inguinal hernia, without obstruction or gangrene, not specified as recurrent: Secondary | ICD-10-CM | POA: Insufficient documentation

## 2022-08-27 DIAGNOSIS — I10 Essential (primary) hypertension: Secondary | ICD-10-CM | POA: Diagnosis not present

## 2022-08-27 DIAGNOSIS — I4891 Unspecified atrial fibrillation: Secondary | ICD-10-CM | POA: Insufficient documentation

## 2022-08-27 DIAGNOSIS — K219 Gastro-esophageal reflux disease without esophagitis: Secondary | ICD-10-CM | POA: Diagnosis not present

## 2022-08-27 DIAGNOSIS — K565 Intestinal adhesions [bands], unspecified as to partial versus complete obstruction: Secondary | ICD-10-CM

## 2022-08-27 DIAGNOSIS — E785 Hyperlipidemia, unspecified: Secondary | ICD-10-CM | POA: Insufficient documentation

## 2022-08-27 DIAGNOSIS — K660 Peritoneal adhesions (postprocedural) (postinfection): Secondary | ICD-10-CM | POA: Insufficient documentation

## 2022-08-27 DIAGNOSIS — G4733 Obstructive sleep apnea (adult) (pediatric): Secondary | ICD-10-CM | POA: Insufficient documentation

## 2022-08-27 DIAGNOSIS — I4819 Other persistent atrial fibrillation: Secondary | ICD-10-CM

## 2022-08-27 DIAGNOSIS — G4730 Sleep apnea, unspecified: Secondary | ICD-10-CM | POA: Diagnosis not present

## 2022-08-27 HISTORY — PX: LAPAROSCOPIC LYSIS OF ADHESIONS: SHX5905

## 2022-08-27 HISTORY — PX: XI ROBOTIC ASSISTED INGUINAL HERNIA REPAIR WITH MESH: SHX6706

## 2022-08-27 SURGERY — REPAIR, HERNIA, INGUINAL, ROBOT-ASSISTED, LAPAROSCOPIC, USING MESH
Anesthesia: General | Site: Abdomen | Laterality: Left

## 2022-08-27 MED ORDER — BUPIVACAINE HCL (PF) 0.5 % IJ SOLN
INTRAMUSCULAR | Status: AC
  Filled 2022-08-27: qty 30

## 2022-08-27 MED ORDER — BUPIVACAINE HCL (PF) 0.5 % IJ SOLN
INTRAMUSCULAR | Status: DC | PRN
  Administered 2022-08-27: 30 mL

## 2022-08-27 MED ORDER — PHENYLEPHRINE HCL (PRESSORS) 10 MG/ML IV SOLN
INTRAVENOUS | Status: DC | PRN
  Administered 2022-08-27: 80 ug via INTRAVENOUS
  Administered 2022-08-27 (×2): 160 ug via INTRAVENOUS

## 2022-08-27 MED ORDER — ORAL CARE MOUTH RINSE: 15.0000 mL | Freq: Once | OROMUCOSAL | Status: AC

## 2022-08-27 MED ORDER — PROPOFOL 10 MG/ML IV BOLUS
INTRAVENOUS | Status: AC
  Filled 2022-08-27: qty 20

## 2022-08-27 MED ORDER — LACTATED RINGERS IV SOLN: INTRAVENOUS | Status: DC

## 2022-08-27 MED ORDER — ROCURONIUM BROMIDE 10 MG/ML (PF) SYRINGE
PREFILLED_SYRINGE | INTRAVENOUS | Status: AC
  Filled 2022-08-27: qty 10

## 2022-08-27 MED ORDER — PHENYLEPHRINE 80 MCG/ML (10ML) SYRINGE FOR IV PUSH (FOR BLOOD PRESSURE SUPPORT)
PREFILLED_SYRINGE | INTRAVENOUS | Status: AC
  Filled 2022-08-27: qty 10

## 2022-08-27 MED ORDER — PHENYLEPHRINE HCL-NACL 20-0.9 MG/250ML-% IV SOLN
INTRAVENOUS | Status: DC | PRN
  Administered 2022-08-27: 30 ug/min via INTRAVENOUS

## 2022-08-27 MED ORDER — EPHEDRINE 5 MG/ML INJ
INTRAVENOUS | Status: AC
  Filled 2022-08-27: qty 5

## 2022-08-27 MED ORDER — CHLORHEXIDINE GLUCONATE 0.12 % MT SOLN
15.0000 mL | Freq: Once | OROMUCOSAL | Status: AC
  Administered 2022-08-27: 15 mL via OROMUCOSAL

## 2022-08-27 MED ORDER — GLYCOPYRROLATE PF 0.2 MG/ML IJ SOSY
PREFILLED_SYRINGE | INTRAMUSCULAR | Status: DC | PRN
  Administered 2022-08-27: .2 mg via INTRAVENOUS

## 2022-08-27 MED ORDER — ROCURONIUM BROMIDE 10 MG/ML (PF) SYRINGE
PREFILLED_SYRINGE | INTRAVENOUS | Status: DC | PRN
  Administered 2022-08-27: 50 mg via INTRAVENOUS
  Administered 2022-08-27: 20 mg via INTRAVENOUS

## 2022-08-27 MED ORDER — ONDANSETRON HCL 4 MG/2ML IJ SOLN: 4.0000 mg | Freq: Once | INTRAMUSCULAR | Status: DC | PRN

## 2022-08-27 MED ORDER — FENTANYL CITRATE (PF) 250 MCG/5ML IJ SOLN
INTRAMUSCULAR | Status: DC | PRN
  Administered 2022-08-27 (×3): 50 ug via INTRAVENOUS

## 2022-08-27 MED ORDER — CEFAZOLIN SODIUM-DEXTROSE 2-4 GM/100ML-% IV SOLN
2.0000 g | INTRAVENOUS | Status: AC
  Administered 2022-08-27: 2 g via INTRAVENOUS

## 2022-08-27 MED ORDER — STERILE WATER FOR IRRIGATION IR SOLN
Status: DC | PRN
  Administered 2022-08-27: 500 mL

## 2022-08-27 MED ORDER — CHLORHEXIDINE GLUCONATE CLOTH 2 % EX PADS
6.0000 | MEDICATED_PAD | Freq: Once | CUTANEOUS | Status: AC
  Administered 2022-08-27: 6 via TOPICAL

## 2022-08-27 MED ORDER — ASPIRIN 81 MG PO TBEC: 81.0000 mg | DELAYED_RELEASE_TABLET | Freq: Every morning | ORAL | Status: AC

## 2022-08-27 MED ORDER — DEXAMETHASONE SODIUM PHOSPHATE 10 MG/ML IJ SOLN
INTRAMUSCULAR | Status: AC
  Filled 2022-08-27: qty 1

## 2022-08-27 MED ORDER — OXYCODONE HCL 5 MG PO TABS: 5.0000 mg | ORAL_TABLET | Freq: Four times a day (QID) | ORAL | 0 refills | Status: DC | PRN

## 2022-08-27 MED ORDER — ACETAMINOPHEN 500 MG PO TABS: 1000.0000 mg | ORAL_TABLET | Freq: Four times a day (QID) | ORAL | 0 refills | Status: AC

## 2022-08-27 MED ORDER — PROPOFOL 10 MG/ML IV BOLUS
INTRAVENOUS | Status: DC | PRN
Start: 2022-08-27 — End: 2022-08-27
  Administered 2022-08-27: 150 mg via INTRAVENOUS

## 2022-08-27 MED ORDER — HYDROMORPHONE HCL 1 MG/ML IJ SOLN
0.2500 mg | INTRAMUSCULAR | Status: DC | PRN
  Administered 2022-08-27 (×2): 0.5 mg via INTRAVENOUS
  Filled 2022-08-27 (×2): qty 0.5

## 2022-08-27 MED ORDER — DOCUSATE SODIUM 100 MG PO CAPS
100.0000 mg | ORAL_CAPSULE | Freq: Two times a day (BID) | ORAL | 2 refills | Status: DC
Start: 2022-08-27 — End: 2022-10-06

## 2022-08-27 MED ORDER — ONDANSETRON HCL 4 MG/2ML IJ SOLN
INTRAMUSCULAR | Status: AC
  Filled 2022-08-27: qty 2

## 2022-08-27 MED ORDER — DEXAMETHASONE SODIUM PHOSPHATE 10 MG/ML IJ SOLN
INTRAMUSCULAR | Status: DC | PRN
  Administered 2022-08-27: 5 mg via INTRAVENOUS

## 2022-08-27 MED ORDER — ONDANSETRON HCL 4 MG/2ML IJ SOLN
INTRAMUSCULAR | Status: AC
  Filled 2022-08-27: qty 6

## 2022-08-27 MED ORDER — MEPERIDINE HCL 50 MG/ML IJ SOLN: 6.2500 mg | INTRAMUSCULAR | Status: DC | PRN

## 2022-08-27 MED ORDER — FENTANYL CITRATE (PF) 250 MCG/5ML IJ SOLN
INTRAMUSCULAR | Status: AC
  Filled 2022-08-27: qty 5

## 2022-08-27 MED ORDER — MIDAZOLAM HCL 2 MG/2ML IJ SOLN
INTRAMUSCULAR | Status: AC
  Filled 2022-08-27: qty 2

## 2022-08-27 MED ORDER — CEFAZOLIN SODIUM-DEXTROSE 2-4 GM/100ML-% IV SOLN
INTRAVENOUS | Status: AC
  Filled 2022-08-27: qty 100

## 2022-08-27 MED ORDER — LIDOCAINE 2% (20 MG/ML) 5 ML SYRINGE
INTRAMUSCULAR | Status: DC | PRN
  Administered 2022-08-27: 100 mg via INTRAVENOUS

## 2022-08-27 SURGICAL SUPPLY — 54 items
ADH SKN CLS APL DERMABOND .7 (GAUZE/BANDAGES/DRESSINGS) ×2
APL PRP STRL LF DISP 70% ISPRP (MISCELLANEOUS) ×2
BLADE SURG 15 STRL LF DISP TIS (BLADE) ×2 IMPLANT
BLADE SURG 15 STRL SS (BLADE) ×2
CHLORAPREP W/TINT 26 (MISCELLANEOUS) ×2 IMPLANT
COVER LIGHT HANDLE STERIS (MISCELLANEOUS) ×4 IMPLANT
COVER MAYO STAND XLG (MISCELLANEOUS) ×2 IMPLANT
COVER TIP SHEARS 8 DVNC (MISCELLANEOUS) ×2 IMPLANT
DEFOGGER SCOPE WARMER CLEARIFY (MISCELLANEOUS) ×2 IMPLANT
DERMABOND ADVANCED .7 DNX12 (GAUZE/BANDAGES/DRESSINGS) ×2 IMPLANT
DRAPE ARM DVNC X/XI (DISPOSABLE) ×6 IMPLANT
DRAPE COLUMN DVNC XI (DISPOSABLE) ×2 IMPLANT
DRAPE HALF SHEET 70X43 (DRAPES) ×2 IMPLANT
DRIVER NDL MEGA SUTCUT DVNCXI (INSTRUMENTS) ×2 IMPLANT
DRIVER NDLE MEGA SUTCUT DVNCXI (INSTRUMENTS) ×2
ELECT REM PT RETURN 9FT ADLT (ELECTROSURGICAL) ×2
ELECTRODE REM PT RTRN 9FT ADLT (ELECTROSURGICAL) ×2 IMPLANT
FORCEPS BPLR R/ABLATION 8 DVNC (INSTRUMENTS) ×2 IMPLANT
GAUZE SPONGE 4X4 12PLY STRL (GAUZE/BANDAGES/DRESSINGS) ×2 IMPLANT
GLOVE BIO SURGEON STRL SZ7 (GLOVE) IMPLANT
GLOVE BIO SURGEON STRL SZ7.5 (GLOVE) IMPLANT
GLOVE BIOGEL PI IND STRL 6.5 (GLOVE) ×4 IMPLANT
GLOVE BIOGEL PI IND STRL 7.0 (GLOVE) ×8 IMPLANT
GLOVE SS BIOGEL STRL SZ 7.5 (GLOVE) IMPLANT
GLOVE SURG SS PI 6.5 STRL IVOR (GLOVE) ×4 IMPLANT
GOWN STRL REUS W/TWL LRG LVL3 (GOWN DISPOSABLE) ×6 IMPLANT
GRASPER SUT TROCAR 14GX15 (MISCELLANEOUS) IMPLANT
KIT PINK PAD W/HEAD ARE REST (MISCELLANEOUS) ×2
KIT PINK PAD W/HEAD ARM REST (MISCELLANEOUS) ×2 IMPLANT
KIT TURNOVER KIT A (KITS) ×2 IMPLANT
MANIFOLD NEPTUNE II (INSTRUMENTS) ×2 IMPLANT
MESH PROGRIP LAP SELF FIXATING (Mesh General) ×2 IMPLANT
MESH PROGRIP LAP SLF FIX 16X12 (Mesh General) ×2 IMPLANT
NDL HYPO 21X1 ECLIPSE (NEEDLE) ×2 IMPLANT
NDL INSUFFLATION 14GA 120MM (NEEDLE) ×2 IMPLANT
NEEDLE HYPO 21X1 ECLIPSE (NEEDLE) ×2
NEEDLE INSUFFLATION 14GA 120MM (NEEDLE) ×2
OBTURATOR OPTICAL STND 8 DVNC (TROCAR) ×2
OBTURATOR OPTICALSTD 8 DVNC (TROCAR) ×2 IMPLANT
PACK LAP CHOLE LZT030E (CUSTOM PROCEDURE TRAY) ×2 IMPLANT
PENCIL HANDSWITCHING (ELECTRODE) ×2 IMPLANT
SCISSORS MNPLR CVD DVNC XI (INSTRUMENTS) ×2 IMPLANT
SEAL CANN UNIV 5-8 DVNC XI (MISCELLANEOUS) ×6 IMPLANT
SET BASIN LINEN APH (SET/KITS/TRAYS/PACK) ×2 IMPLANT
SET TUBE SMOKE EVAC HIGH FLOW (TUBING) ×2 IMPLANT
SOL PREP POV-IOD 4OZ 10% (MISCELLANEOUS) ×2 IMPLANT
SUT MNCRL AB 4-0 PS2 18 (SUTURE) ×2 IMPLANT
SUT V-LOC 90 ABS 3-0 VLT V-20 (SUTURE) IMPLANT
SYR 30ML LL (SYRINGE) ×2 IMPLANT
TAPE TRANSPORE STRL 2 31045 (GAUZE/BANDAGES/DRESSINGS) ×2 IMPLANT
TRAY FOL W/BAG SLVR 16FR STRL (SET/KITS/TRAYS/PACK) ×2 IMPLANT
TRAY FOLEY W/BAG SLVR 16FR LF (SET/KITS/TRAYS/PACK) ×2
TROCAR Z-THREAD FIOS 5X100MM (TROCAR) IMPLANT
WATER STERILE IRR 500ML POUR (IV SOLUTION) ×2 IMPLANT

## 2022-08-27 NOTE — Discharge Instructions (Signed)
Ambulatory Surgery Discharge Instructions  General Anesthesia or Sedation Do not drive or operate heavy machinery for 24 hours.  Do not consume alcohol, tranquilizers, sleeping medications, or any non-prescribed medications for 24 hours. Do not make important decisions or sign any important papers in the next 24 hours. You should have someone with you tonight at home.  Activity  You are advised to go directly home from the hospital.  Restrict your activities and rest for a day.  Resume light activity tomorrow. No heavy lifting over 10 lbs or strenuous exercise.  Fluids and Diet Begin with clear liquids, bouillon, dry toast, soda crackers.  If not nauseated, you may go to a regular diet when you desire.  Greasy and spicy foods are not advised.  Medications  If you have not had a bowel movement in 24 hours, take 2 tablespoons over the counter Milk of mag.             You May resume your blood thinners tomorrow (Aspirin, coumadin, or other).  You are being discharged with prescriptions for Opioid/Narcotic Medications: There are some specific considerations for these medications that you should know. Opioid Meds have risks & benefits. Addiction to these meds is always a concern with prolonged use Take medication only as directed Do not drive while taking narcotic pain medication Do not crush tablets or capsules Do not use a different container than medication was dispensed in Lock the container of medication in a cool, dry place out of reach of children and pets. Opioid medication can cause addiction Do not share with anyone else (this is a felony) Do not store medications for future use. Dispose of them properly.     Disposal:  Find a [facility] household drug take back site near you.  If you can't get to a drug take back site, use the recipe below as a last resort to dispose of expired, unused or unwanted drugs. Disposal  (Do not dispose chemotherapy drugs this way, talk to your  prescribing doctor instead.) Step 1: Mix drugs (do not crush) with dirt, kitty litter, or used coffee grounds and add a small amount of water to dissolve any solid medications. Step 2: Seal drugs in plastic bag. Step 3: Place plastic bag in trash. Step 4: Take prescription container and scratch out personal information, then recycle or throw away.  Operative Site  You have a liquid bandage over your incisions, this will begin to flake off in about a week. Ok to shower tomorrow. Keep wound clean and dry. No baths or swimming. No lifting more than 10 pounds.  Contact Information: If you have questions or concerns, please call our office, [phone], Monday- Thursday 8AM-5PM and Friday 8AM-12Noon.  If it is after hours or on the weekend, please call [facility] Main Number, [phone], and ask to speak to the surgeon on call for Dr. Pappayliou at [facility].   SPECIFIC COMPLICATIONS TO WATCH FOR: Inability to urinate Fever over 101? F by mouth Nausea and vomiting lasting longer than 24 hours. Pain not relieved by medication ordered Swelling around the operative site Increased redness, warmth, hardness, around operative area Numbness, tingling, or cold fingers or toes Blood -soaked dressing, (small amounts of oozing may be normal) Increasing and progressive drainage from surgical area or exam site  

## 2022-08-27 NOTE — Anesthesia Postprocedure Evaluation (Signed)
Anesthesia Post Note  Patient: Deitra Mayo  Procedure(s) Performed: XI ROBOTIC ASSISTED INGUINAL HERNIA REPAIR WITH MESH (Left: Abdomen) LAPAROSCOPIC LYSIS OF ADHESIONS (Abdomen)  Patient location during evaluation: Phase II Anesthesia Type: General Level of consciousness: awake and alert and oriented Pain management: pain level controlled Vital Signs Assessment: post-procedure vital signs reviewed and stable Respiratory status: spontaneous breathing, nonlabored ventilation and respiratory function stable Cardiovascular status: blood pressure returned to baseline and stable Postop Assessment: no apparent nausea or vomiting Anesthetic complications: no  No notable events documented.   Last Vitals:  Vitals:   08/27/22 1230 08/27/22 1244  BP: 91/65 120/68  Pulse: 70 64  Resp: 17 16  Temp:  36.7 C  SpO2: 93% 95%    Last Pain:  Vitals:   08/27/22 1244  TempSrc: Oral  PainSc: 4                  Cassandra Harbold C Chasidy Janak

## 2022-08-27 NOTE — Anesthesia Preprocedure Evaluation (Signed)
Anesthesia Evaluation  Patient identified by MRN, date of birth, ID band Patient awake    Reviewed: Allergy & Precautions, H&P , NPO status , Patient's Chart, lab work & pertinent test results  Airway Mallampati: II  TM Distance: >3 FB Neck ROM: Full    Dental  (+) Dental Advisory Given, Teeth Intact   Pulmonary sleep apnea    Pulmonary exam normal breath sounds clear to auscultation       Cardiovascular hypertension, Normal cardiovascular exam+ dysrhythmias (Had ablation) Atrial Fibrillation  Rhythm:Regular Rate:Normal     Neuro/Psych  Headaches PSYCHIATRIC DISORDERS Anxiety      Neuromuscular disease (RLS)    GI/Hepatic Neg liver ROS,GERD  Medicated,,  Endo/Other  negative endocrine ROS    Renal/GU negative Renal ROS  negative genitourinary   Musculoskeletal  (+) Arthritis  (gout),    Abdominal   Peds negative pediatric ROS (+)  Hematology negative hematology ROS (+)   Anesthesia Other Findings   Reproductive/Obstetrics negative OB ROS                             Anesthesia Physical Anesthesia Plan  ASA: 3  Anesthesia Plan: General   Post-op Pain Management: Dilaudid IV   Induction: Intravenous  PONV Risk Score and Plan: 4 or greater and Ondansetron and Dexamethasone  Airway Management Planned: Oral ETT  Additional Equipment:   Intra-op Plan:   Post-operative Plan: Extubation in OR  Informed Consent: I have reviewed the patients History and Physical, chart, labs and discussed the procedure including the risks, benefits and alternatives for the proposed anesthesia with the patient or authorized representative who has indicated his/her understanding and acceptance.     Dental advisory given  Plan Discussed with: CRNA and Surgeon  Anesthesia Plan Comments:        Anesthesia Quick Evaluation

## 2022-08-27 NOTE — Anesthesia Procedure Notes (Signed)
Procedure Name: Intubation Date/Time: 08/27/2022 9:43 AM  Performed by: Marquis Buggy, CRNAPre-anesthesia Checklist: Emergency Drugs available, Suction available, Patient identified, Patient being monitored and Timeout performed Patient Re-evaluated:Patient Re-evaluated prior to induction Oxygen Delivery Method: Circle system utilized Preoxygenation: Pre-oxygenation with 100% oxygen Induction Type: IV induction Ventilation: Oral airway inserted - appropriate to patient size and Mask ventilation without difficulty Laryngoscope Size: Mac and 4 Grade View: Grade I Tube type: Oral Tube size: 7.5 mm Number of attempts: 1 Airway Equipment and Method: Stylet Placement Confirmation: ETT inserted through vocal cords under direct vision, positive ETCO2, breath sounds checked- equal and bilateral and CO2 detector Secured at: 24 cm Tube secured with: Tape Dental Injury: Teeth and Oropharynx as per pre-operative assessment
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
## 2022-08-27 NOTE — Op Note (Signed)
[facility] Operative Note  08/27/22  Preoperative Diagnosis: Left inguinal Hernia   Postoperative Diagnosis: Intra-abdominal adhesions, left inguinal hernia   Procedure(s) Performed: Robotic assisted laparoscopic left inguinal hernia repair with mesh, laparoscopic and robotic lysis of adhesions   Surgeon: Theophilus Kinds, DO   Assistants: Milas Hock, MS4   Anesthesia: General endotracheal   Anesthesiologist: Molli Barrows, MD    Specimens: None   Estimated Blood Loss: Minimal   Blood Replacement: None    Complications: None   Wound Class:Clean   Operative Indications: The patient has a left inguinal hernia that is symptomatic and they want repaired. We discussed robotic assisted laparoscopic inguinal hernia repair and risk of bleeding, infection, issues with chronic pain post operatively, use of mesh, risk of recurrence, chance of needing to repair a bilateral hernia, risk of injury to bowel or bladder, and risk of injury to cord structures for male patients.   Findings: Significant intra-abdominal adhesions noted from previous surgery, lysis of adhesions performed Vas Deferens and cord structures identified and preserved Laparoscopic ProGrip mesh used Hemostasis achieved   Procedure: The patient was taken to the operating room and placed supine. General endotracheal anesthesia was induced. Intravenous antibiotics were administered per protocol.  A foley catheter was placed and a orogastric tube positioned to decompress the stomach. The abdomen was prepared and draped in the usual sterile fashion.  A time-out was completed verifying correct patient, procedure, site, positioning, and implant(s) and/or special equipment prior to beginning this procedure.  Given the patient's history of intra-abdominal surgeries, incision was made at Palmer's point.  Veress needle inserted at palmer's point.  Saline drop test noted to be positive with gradual  increase in pressure after initiation of gas insufflation.  15 mm of pressure was achieved prior to removing the Veress needle and then placing a 8 mm port via the Optiview technique through the Palmer's point site.  Inspection of the area afterwards noted no injury to the surrounding organs during insertion of the needle and the port.  Upon further exploration of the abdomen, there was significant intra-abdominal adhesions noted from his previous surgeries.  A 5 mm port was placed in the left side of the abdomen, using a combination of sharp dissection, blunt dissection, and electrocautery, adhesions were taken down from the anterior abdominal wall to allow for the other robotic ports to be placed.  An 8 mm robotic port was placed in the midline, in the supraumbilical location 8 cm from the Palmer's point port, and another 8 mm robotic port on the right side under direct supervision. The [employer] platform was then brought into the operative field and docked to the ports.  There was still some intra-abdominal adhesions to the midline of the abdominal wall.  Again using a combination of sharp dissection, blunt dissection, and electrocautery, the remaining intra-abdominal adhesions were taken down to allow access into the pelvis.  Total time spent lysing adhesions was about 40 to 50 minutes.  Examination of the abdominal cavity noted a left inguinal hernia.  A peritoneal flap was created approximately 8 cm cephalad to the defect by using scissors with electrocautery.  Dissection was carried down towards the pubic tubercle, developing the myopectineal orifice view. Laterally the flap was carried towards the ASIS.  A indirect hernia sac was noted, which carefully dissected away from the adjacent tissues to be fully reduced out of hernia cavity.  Any bleeding was controlled with combination of electrocautery and manual pressure.  After confirming adequate dissection and the peritoneal reflection completely down  and away from the cord structures, a ProGrip laparoscopic mesh was inserted.  After noting proper placement of the mesh with the peritoneal reflection deep to it, the previously created peritoneal flap was secured back up to the anterior abdominal wall using running 3-0 V-Lock. All needles were then removed out of the abdominal cavity, Xi platform undocked from the ports and removed off of operative field.  The abdomen was again inspected for hemostasis.  There was no active bleeding from the previous lysis of adhesions.  There were also no other injuries noted to other structures at the completion of the case.  The abdomen was then desufflated and ports removed.  The incisions were localized with Marcaine.  All skin incisions were closed with a subcuticular stitch of Monocryl 4-0. Dermabond was applied. The testis was gently pulled down into its anatomic position in the scrotum.  Final inspection revealed acceptable hemostasis. All counts were correct at the end of the case. The patient was awakened from anesthesia and extubated without complication.  The patient went to the PACU in stable condition.   Theophilus Kinds, DO [facility] [address] [place] [place], [place] [address] [phone] (office)

## 2022-08-27 NOTE — Transfer of Care (Signed)
Immediate Anesthesia Transfer of Care Note  Patient: Anthony [name]  Procedure(s) Performed: XI ROBOTIC ASSISTED INGUINAL HERNIA REPAIR WITH MESH (Left: Abdomen) LAPAROSCOPIC LYSIS OF ADHESIONS (Abdomen)  Patient Location: PACU  Anesthesia Type:General  Level of Consciousness: awake, alert , and oriented  Airway & Oxygen Therapy: Patient Spontanous Breathing and Patient connected to face mask oxygen  Post-op Assessment: Report given to RN, Post -op Vital signs reviewed and stable, and Patient moving all extremities X 4  Post vital signs: Reviewed and stable  Last Vitals:  Vitals Value Taken Time  BP 134/76 08/27/22 1200  Temp 36.6 C 08/27/22 1148  Pulse 65 08/27/22 1210  Resp 19 08/27/22 1210  SpO2 100 % 08/27/22 1210  Vitals shown include unfiled device data.  Last Pain:  Vitals:   08/27/22 1200  TempSrc:   PainSc: 4          Complications: No notable events documented.

## 2022-08-27 NOTE — Progress Notes (Signed)
[facility]  Spoke with the patient's wife in the consultation room.  I explained that he tolerated the procedure without difficulty.  He has dissolvable stitches under the skin with overlying skin glue.  This will flake off in 10 to 14 days.  I discharged him home with a prescription for narcotic pain medication that they should take as needed for pain.  I also want him taking scheduled Tylenol.  If they take the narcotic pain medication, they should take a stool softener as well.  The patient will follow-up with me in 2 weeks.  All questions were answered to her expressed satisfaction.  Catherine Pappayliou, DO [facility] [address] [place], [place] [address] [phone] (office)  

## 2022-08-31 ENCOUNTER — Encounter (HOSPITAL_COMMUNITY): Payer: Self-pay | Admitting: Surgery

## 2022-09-03 ENCOUNTER — Other Ambulatory Visit: Payer: Self-pay | Admitting: Family Medicine

## 2022-09-07 ENCOUNTER — Other Ambulatory Visit: Payer: Self-pay

## 2022-09-07 ENCOUNTER — Ambulatory Visit (INDEPENDENT_AMBULATORY_CARE_PROVIDER_SITE_OTHER): Payer: Medicare Other | Admitting: Surgery

## 2022-09-07 ENCOUNTER — Encounter: Payer: Self-pay | Admitting: Surgery

## 2022-09-07 VITALS — BP 136/83 | HR 61 | Temp 97.7°F | Resp 18 | Ht 72.0 in | Wt 190.0 lb

## 2022-09-07 DIAGNOSIS — Z09 Encounter for follow-up examination after completed treatment for conditions other than malignant neoplasm: Secondary | ICD-10-CM

## 2022-09-07 NOTE — Progress Notes (Signed)
[facility] Note   HPI:  67 y.o. Male presents to clinic for post-op follow-up status post robotic assisted laparoscopic left inguinal hernia repair with mesh and intra-abdominal lysis of adhesions on 8/9.  Patient has overall been doing well since his surgery.  He does still have some pain in his left groin, but it is slowly improving.  He is tolerating a diet without nausea and vomiting, moving his bowels without issue.  Denies fevers and chills.  Review of Systems:  All other review of systems: otherwise negative   Vital Signs:  BP 136/83   Pulse 61   Temp 97.7 F (36.5 C) (Oral)   Resp 18   Ht 6' (1.829 m)   Wt 190 lb (86.2 kg)   SpO2 96%   BMI 25.77 kg/m    Physical Exam:  Physical Exam Vitals reviewed.  Constitutional:      Appearance: Normal appearance.  Abdominal:     Comments: Abdomen soft, nondistended, no percussion tenderness, nontender to palpation; no rigidity, guarding, rebound tenderness; laparoscopic incision sites healing well with Dermabond in place, improving erythema, no significant swelling or pain in his left groin  Neurological:     Mental Status: He is alert.     Laboratory studies: None   Imaging:  None   Assessment:  67 y.o. yo Male who presents for follow-up status post robotic assisted laparoscopic left inguinal hernia repair with mesh and intra-abdominal lysis of adhesions on 8/9.  Plan:  -Patient is overall doing well since surgery.  Pain is continuing to improve, tolerating a diet, and moving his bowels -Advised that he can still have some pain in his groin, and he should ice his groin in the evenings, especially on days that he is more active -He can use antibiotic ointment prior to showering to help loosen the remaining skin glue to get them off of the incision sites -Continue to take it easy for an additional 2 to 4 weeks to allow everything to completely heal.  When he starts to increase activity, he should start slow  If he is having increased pain in his abdomen or groin -Follow up as needed  All of the above recommendations were discussed with the patient, and all of patient's  questions were answered to his expressed satisfaction.  Theophilus Kinds, DO [facility] [address] [place] [place], [place] [address] [phone] (office)

## 2022-09-26 ENCOUNTER — Other Ambulatory Visit: Payer: Self-pay | Admitting: Family Medicine

## 2022-09-26 DIAGNOSIS — I10 Essential (primary) hypertension: Secondary | ICD-10-CM

## 2022-09-27 DIAGNOSIS — M542 Cervicalgia: Secondary | ICD-10-CM | POA: Diagnosis not present

## 2022-09-27 DIAGNOSIS — M9901 Segmental and somatic dysfunction of cervical region: Secondary | ICD-10-CM | POA: Diagnosis not present

## 2022-09-27 DIAGNOSIS — M546 Pain in thoracic spine: Secondary | ICD-10-CM | POA: Diagnosis not present

## 2022-09-27 DIAGNOSIS — M9902 Segmental and somatic dysfunction of thoracic region: Secondary | ICD-10-CM | POA: Diagnosis not present

## 2022-09-29 DIAGNOSIS — M542 Cervicalgia: Secondary | ICD-10-CM | POA: Diagnosis not present

## 2022-09-29 DIAGNOSIS — M546 Pain in thoracic spine: Secondary | ICD-10-CM | POA: Diagnosis not present

## 2022-09-29 DIAGNOSIS — M9902 Segmental and somatic dysfunction of thoracic region: Secondary | ICD-10-CM | POA: Diagnosis not present

## 2022-09-29 DIAGNOSIS — M9901 Segmental and somatic dysfunction of cervical region: Secondary | ICD-10-CM | POA: Diagnosis not present

## 2022-10-01 DIAGNOSIS — M9901 Segmental and somatic dysfunction of cervical region: Secondary | ICD-10-CM | POA: Diagnosis not present

## 2022-10-01 DIAGNOSIS — M546 Pain in thoracic spine: Secondary | ICD-10-CM | POA: Diagnosis not present

## 2022-10-01 DIAGNOSIS — M9902 Segmental and somatic dysfunction of thoracic region: Secondary | ICD-10-CM | POA: Diagnosis not present

## 2022-10-01 DIAGNOSIS — M542 Cervicalgia: Secondary | ICD-10-CM | POA: Diagnosis not present

## 2022-10-06 ENCOUNTER — Ambulatory Visit: Payer: Medicare Other | Admitting: Family Medicine

## 2022-10-06 ENCOUNTER — Encounter: Payer: Self-pay | Admitting: Family Medicine

## 2022-10-06 VITALS — BP 152/93 | HR 73 | Temp 98.0°F | Ht 72.0 in | Wt 189.0 lb

## 2022-10-06 DIAGNOSIS — R21 Rash and other nonspecific skin eruption: Secondary | ICD-10-CM | POA: Insufficient documentation

## 2022-10-06 MED ORDER — PREDNISONE 10 MG PO TABS: ORAL_TABLET | ORAL | 0 refills | Status: DC

## 2022-10-06 MED ORDER — HYDROXYZINE PAMOATE 25 MG PO CAPS: 25.0000 mg | ORAL_CAPSULE | Freq: Three times a day (TID) | ORAL | 0 refills | Status: DC | PRN

## 2022-10-06 NOTE — Patient Instructions (Signed)
Medications as prescribed.  If doesn't resolve, please let me know.

## 2022-10-06 NOTE — Progress Notes (Signed)
Subjective:  Patient ID: Anthony [name], male    DOB: 1955/06/03  Age: 67 y.o. MRN: [id number]  CC: Chief Complaint  Patient presents with   rash on abdominal     Patient reports having surgery on aug 9 -mesh applied itching for about a week, no fever     HPI:  67 year old male presents for evaluation of the above.  Patient reports that he has had a rash for the past 5 days.  Located diffusely.  He states that they seem like bites.  He is unsure of any new contacts or spiders.  Has not really been outside more than usual or in different places.  He states that it is quite itchy.  No relief with triamcinolone.  Patient Active Problem List   Diagnosis Date Noted   Rash 10/06/2022   Inguinal hernia, left 08/27/2022   Intra-abdominal adhesions 08/27/2022   Abdominal wall bulge 07/06/2022   Trigger finger 12/29/2021   RLS (restless legs syndrome) 04/21/2021   Gout 12/24/2020   Erectile dysfunction 12/24/2020   Anaphylactic shock due to adverse food reaction 06/02/2017   OSA on CPAP    Hyperlipidemia    History of kidney stones    GERD (gastroesophageal reflux disease)    Persistent atrial fibrillation (HCC)    Essential hypertension, benign 06/02/2012    Social Hx   Social History   Socioeconomic History   Marital status: Married    Spouse name: Not on file   Number of children: Not on file   Years of education: Not on file   Highest education level: Not on file  Occupational History   Not on file  Tobacco Use   Smoking status: Never   Smokeless tobacco: Never  Vaping Use   Vaping status: Never Used  Substance and Sexual Activity   Alcohol use: No   Drug use: No   Sexual activity: Yes  Other Topics Concern   Not on file  Social History Narrative   Lives in [place]   Works at [employer] and [place]   Married   Social Determinants of Health   Financial Resource Strain: Low Risk  (10/21/2021)   Overall Financial Resource Strain (CARDIA)    Difficulty of  Paying Living Expenses: Not hard at all  Food Insecurity: No Food Insecurity (10/21/2021)   Hunger Vital Sign    Worried About Running Out of Food in the Last Year: Never true    Ran Out of Food in the Last Year: Never true  Transportation Needs: No Transportation Needs (10/21/2021)   PRAPARE - Administrator, Civil Service (Medical): No    Lack of Transportation (Non-Medical): No  Physical Activity: Sufficiently Active (10/21/2021)   Exercise Vital Sign    Days of Exercise per Week: 4 days    Minutes of Exercise per Session: 40 min  Stress: No Stress Concern Present (10/21/2021)   [employer] of Occupational Health - Occupational Stress Questionnaire    Feeling of Stress : Not at all  Social Connections: Moderately Isolated (10/21/2021)   Social Connection and Isolation Panel [NHANES]    Frequency of Communication with Friends and Family: More than three times a week    Frequency of Social Gatherings with Friends and Family: More than three times a week    Attends Religious Services: Never    Database administrator or Organizations: No    Attends Banker Meetings: Never    Marital Status: Married    Review  of Systems Per HPI  Objective:  BP (!) 152/93   Pulse 73   Temp 98 F (36.7 C)   Ht 6' (1.829 m)   Wt 189 lb (85.7 kg)   SpO2 99%   BMI 25.63 kg/m      10/06/2022    9:59 AM 09/07/2022    9:37 AM 08/27/2022   12:44 PM  BP/Weight  Systolic BP 152 136 120  Diastolic BP 93 83 68  Wt. (Lbs) 189 190   BMI 25.63 kg/m2 25.77 kg/m2     Physical Exam Constitutional:      General: He is not in acute distress.    Appearance: Normal appearance.  HENT:     Head: Normocephalic and atraumatic.  Cardiovascular:     Rate and Rhythm: Normal rate and regular rhythm.  Pulmonary:     Effort: Pulmonary effort is normal.     Breath sounds: Normal breath sounds.  Skin:    Comments: Chest, abdomen, right upper arm with numerous erythematous papules.   Neurological:     Mental Status: He is alert.     Lab Results  Component Value Date   WBC 6.1 07/16/2022   HGB 13.8 07/16/2022   HCT 40.9 07/16/2022   PLT 153 07/16/2022   GLUCOSE 80 08/25/2022   CHOL 109 07/16/2022   TRIG 61 07/16/2022   HDL 39 (L) 07/16/2022   LDLCALC 56 07/16/2022   ALT 24 07/16/2022   AST 22 07/16/2022   NA 140 08/25/2022   K 3.9 08/25/2022   CL 105 08/25/2022   CREATININE 1.12 08/25/2022   BUN 15 08/25/2022   CO2 28 08/25/2022   TSH 2.720 04/08/2016   PSA 0.7 03/29/2014     Assessment & Plan:   Problem List Items Addressed This Visit       Musculoskeletal and Integument   Rash - Primary    Treating empirically with prednisone.  Hydroxyzine for itching.       Meds ordered this encounter  Medications   predniSONE (DELTASONE) 10 MG tablet    Sig: 50 mg daily x 2 days, then 40 mg daily x 2 days, then 30 mg daily x 2 days, then 20 mg daily x 2 days, then 10 mg daily x 2 days.    Dispense:  30 tablet    Refill:  0   hydrOXYzine (VISTARIL) 25 MG capsule    Sig: Take 1 capsule (25 mg total) by mouth every 8 (eight) hours as needed for itching.    Dispense:  30 capsule    Refill:  0    Follow-up:  Return if symptoms worsen or fail to improve.  Everlene Other DO [facility]

## 2022-10-06 NOTE — Assessment & Plan Note (Signed)
Treating empirically with prednisone.  Hydroxyzine for itching.

## 2022-10-11 DIAGNOSIS — M546 Pain in thoracic spine: Secondary | ICD-10-CM | POA: Diagnosis not present

## 2022-10-11 DIAGNOSIS — M9902 Segmental and somatic dysfunction of thoracic region: Secondary | ICD-10-CM | POA: Diagnosis not present

## 2022-10-11 DIAGNOSIS — M542 Cervicalgia: Secondary | ICD-10-CM | POA: Diagnosis not present

## 2022-10-11 DIAGNOSIS — M9901 Segmental and somatic dysfunction of cervical region: Secondary | ICD-10-CM | POA: Diagnosis not present

## 2022-10-16 ENCOUNTER — Other Ambulatory Visit: Payer: Self-pay | Admitting: Family Medicine

## 2022-10-16 DIAGNOSIS — E7800 Pure hypercholesterolemia, unspecified: Secondary | ICD-10-CM

## 2022-10-16 DIAGNOSIS — I10 Essential (primary) hypertension: Secondary | ICD-10-CM

## 2022-10-29 ENCOUNTER — Ambulatory Visit: Payer: Medicare Other

## 2022-10-29 VITALS — Ht 72.0 in | Wt 187.0 lb

## 2022-10-29 DIAGNOSIS — Z0000 Encounter for general adult medical examination without abnormal findings: Secondary | ICD-10-CM
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
## 2022-10-29 NOTE — Patient Instructions (Signed)
Anthony [name] , Thank you for taking time to come for your Medicare Wellness Visit. I appreciate your ongoing commitment to your health goals. Please review the following plan we discussed and let me know if I can assist you in the future.   Referrals/Orders/Follow-Ups/Clinician Recommendations: Aim for 30 minutes of exercise or brisk walking, 6-8 glasses of water, and 5 servings of fruits and vegetables each day.   This is a list of the screening recommended for you and due dates:  Health Maintenance  Topic Date Due   Zoster (Shingles) Vaccine (2 of 2) 05/31/2016   Flu Shot  04/18/2023*   Medicare Annual Wellness Visit  10/29/2023   DTaP/Tdap/Td vaccine (2 - Td or Tdap) 03/28/2029   Colon Cancer Screening  08/29/2029   Pneumonia Vaccine  Completed   HPV Vaccine  Aged Out   COVID-19 Vaccine  Discontinued   Hepatitis C Screening  Discontinued  *Topic was postponed. The date shown is not the original due date.    Advanced directives: (Provided) Advance directive discussed with you today. I have provided a copy for you to complete at home and have notarized. Once this is complete, please bring a copy in to our office so we can scan it into your chart. Information on Advanced Care Planning can be found at [facility] Advance Health Care Directives Advance Health Care Directives ([email or web address]    Next Medicare Annual Wellness Visit scheduled for next year: Yes  insert Preventive Care attachment Insert FALL PREVENTION attachment if needed

## 2022-11-09 NOTE — Addendum Note (Signed)
Addended by: Lorrene Reid on: 11/09/2022 02:22 PM   Modules accepted: Level of Service

## 2022-11-18 NOTE — Progress Notes (Signed)
I agree with the management as indicated in the note.  Peityn Payton DO [facility]  

## 2022-12-23 ENCOUNTER — Emergency Department (HOSPITAL_COMMUNITY)
Admission: EM | Admit: 2022-12-23 | Discharge: 2022-12-23 | Disposition: A | Discharge status: Home / Self Care | Payer: Medicare Other | Attending: Emergency Medicine | Admitting: Emergency Medicine

## 2022-12-23 ENCOUNTER — Emergency Department (HOSPITAL_COMMUNITY): Payer: Medicare Other

## 2022-12-23 ENCOUNTER — Other Ambulatory Visit: Payer: Self-pay

## 2022-12-23 ENCOUNTER — Encounter (HOSPITAL_COMMUNITY): Payer: Self-pay

## 2022-12-23 DIAGNOSIS — Z79899 Other long term (current) drug therapy: Secondary | ICD-10-CM | POA: Diagnosis not present

## 2022-12-23 DIAGNOSIS — I10 Essential (primary) hypertension: Secondary | ICD-10-CM | POA: Diagnosis not present

## 2022-12-23 DIAGNOSIS — W01198A Fall on same level from slipping, tripping and stumbling with subsequent striking against other object, initial encounter: Secondary | ICD-10-CM | POA: Diagnosis not present

## 2022-12-23 DIAGNOSIS — Z7901 Long term (current) use of anticoagulants: Secondary | ICD-10-CM | POA: Diagnosis not present

## 2022-12-23 DIAGNOSIS — Z7982 Long term (current) use of aspirin: Secondary | ICD-10-CM | POA: Diagnosis not present

## 2022-12-23 DIAGNOSIS — I4891 Unspecified atrial fibrillation: Secondary | ICD-10-CM | POA: Insufficient documentation

## 2022-12-23 DIAGNOSIS — S0990XA Unspecified injury of head, initial encounter: Secondary | ICD-10-CM | POA: Diagnosis not present

## 2022-12-23 NOTE — ED Provider Notes (Signed)
[facility] AT [facility] Provider Note   CSN: [id number] Arrival date & time: 12/23/22  1929     History  Chief Complaint  Patient presents with   Anthony [name] is a 67 y.o. male.  Patient is a 66 year old male with past medical history of atrial fibrillation status post ablation and previously on Eliquis.  Patient also has history of hypertension and hyperlipidemia.  Patient presenting for evaluation of a head injury.  He reports his daughters pitbull jumping up on him and knocking him over.  He then hit his head on the refrigerator and was knocked unconscious for several moments.  He reports headache immediately afterward, but states that has resolved.  He denies any visual disturbances.  No other injury or trauma.  The history is provided by the patient.       Home Medications Prior to Admission medications   Medication Sig Start Date End Date Taking? Authorizing Provider  allopurinol (ZYLOPRIM) 100 MG tablet TAKE 2 TABLETS BY MOUTH DAILY Patient taking differently: Take 100 mg by mouth in the morning and at bedtime. 05/28/22   Tommie Sams, DO  aspirin EC 81 MG tablet Take 1 tablet (81 mg total) by mouth in the morning. Swallow whole. 08/29/22   Pappayliou, Santina Evans A, DO  EPINEPHrine (AUVI-Q) 0.3 mg/0.3 mL IJ SOAJ injection Inject 0.3 mLs (0.3 mg total) into the muscle as needed for anaphylaxis. Use as directed for severe allergic reaction 09/06/19   Laroy Apple M, DO  ezetimibe (ZETIA) 10 MG tablet TAKE 1 TABLET BY MOUTH DAILY 10/18/22   Everlene Other G, DO  hydrochlorothiazide (HYDRODIURIL) 25 MG tablet TAKE 1 TABLET BY MOUTH DAILY 09/27/22   Tommie Sams, DO  hydrOXYzine (VISTARIL) 25 MG capsule Take 1 capsule (25 mg total) by mouth every 8 (eight) hours as needed for itching. 10/06/22   Tommie Sams, DO  lisinopril (ZESTRIL) 20 MG tablet TAKE 1 TABLET BY MOUTH TWICE  DAILY 10/18/22   Everlene Other G, DO  loratadine (CLARITIN) 10 MG  tablet Take 10 mg by mouth at bedtime.    [provider]  metoprolol succinate (TOPROL-XL) 25 MG 24 hr tablet TAKE 1 TABLET BY MOUTH DAILY 08/05/22   Mealor, Roberts Gaudy, MD  Multiple Vitamin (MULTIVITAMIN) tablet Take 1 tablet by mouth in the morning.    [provider]  pantoprazole (PROTONIX) 40 MG tablet TAKE 1 TABLET BY MOUTH DAILY Patient taking differently: Take 40 mg by mouth daily as needed (indigestion/heartburn.). 07/21/22   Tommie Sams, DO  predniSONE (DELTASONE) 10 MG tablet 50 mg daily x 2 days, then 40 mg daily x 2 days, then 30 mg daily x 2 days, then 20 mg daily x 2 days, then 10 mg daily x 2 days. 10/06/22   Tommie Sams, DO  rosuvastatin (CRESTOR) 5 MG tablet TAKE 1 TABLET BY MOUTH DAILY 09/06/22   Everlene Other G, DO  triamcinolone ointment (KENALOG) 0.5 % Apply 1 Application topically 2 (two) times daily. Patient taking differently: Apply 1 Application topically 2 (two) times daily as needed (skin irritation/itching.). 06/24/22   Tommie Sams, DO  VENTOLIN HFA 108 (90 Base) MCG/ACT inhaler USE 2 INHALATIONS BY MOUTH 4  TIMES DAILY (EVERY 6 HOURS) AS  NEEDED FOR WHEEZING OR SHORTNESS OF BREATH 08/18/22   Everlene Other G, DO  verapamil (CALAN-SR) 240 MG CR tablet TAKE 1 TABLET BY MOUTH AT  BEDTIME 07/19/22  Cook, Jayce G, DO  vitamin B-12 (CYANOCOBALAMIN) 1000 MCG tablet Take 1,000 mcg by mouth in the morning.    [provider]      Allergies    Spironolactone, Contrast media [iodinated contrast media], Isovue [iopamidol], Lipitor [atorvastatin], Nsaids, Zocor [simvastatin], and Acyclovir and related    Review of Systems   Review of Systems  All other systems reviewed and are negative.   Physical Exam Updated Vital Signs BP (!) 144/77   Pulse 67   Temp 98.6 F (37 C) (Oral)   Resp 16   Ht 6' (1.829 m)   Wt 83.9 kg   SpO2 97%   BMI 25.09 kg/m  Physical Exam Vitals and nursing note reviewed.  Constitutional:      General: He is not in acute  distress.    Appearance: He is well-developed. He is not diaphoretic.  HENT:     Head: Normocephalic and atraumatic.  Eyes:     Extraocular Movements: Extraocular movements intact.     Pupils: Pupils are equal, round, and reactive to light.  Cardiovascular:     Rate and Rhythm: Normal rate and regular rhythm.     Heart sounds: No murmur heard.    No friction rub.  Pulmonary:     Effort: Pulmonary effort is normal. No respiratory distress.     Breath sounds: Normal breath sounds. No wheezing or rales.  Abdominal:     General: Bowel sounds are normal. There is no distension.     Palpations: Abdomen is soft.     Tenderness: There is no abdominal tenderness.  Musculoskeletal:        General: Normal range of motion.     Cervical back: Normal range of motion and neck supple.  Skin:    General: Skin is warm and dry.  Neurological:     General: No focal deficit present.     Mental Status: He is alert and oriented to person, place, and time.     Cranial Nerves: No cranial nerve deficit.     Motor: No weakness.     Coordination: Coordination normal.     ED Results / Procedures / Treatments   Labs (all labs ordered are listed, but only abnormal results are displayed) Labs Reviewed - No data to display  EKG None  Radiology CT Head Wo Contrast  Result Date: 12/23/2022 CLINICAL DATA:  Trauma EXAM: CT HEAD WITHOUT CONTRAST TECHNIQUE: Contiguous axial images were obtained from the base of the skull through the vertex without intravenous contrast. RADIATION DOSE REDUCTION: This exam was performed according to the departmental dose-optimization program which includes automated exposure control, adjustment of the mA and/or kV according to patient size and/or use of iterative reconstruction technique. COMPARISON:  Head CT 03/29/2019 FINDINGS: Brain: No evidence of acute infarction, hemorrhage, hydrocephalus, extra-axial collection or mass lesion/mass effect. Again seen is mild diffuse atrophy.  Vascular: No hyperdense vessel or unexpected calcification. Skull: Normal. Negative for fracture or focal lesion. Sinuses/Orbits: No acute finding. Other: None. IMPRESSION: No acute intracranial abnormality. Electronically Signed   By: Darliss Cheney M.D.   On: 12/23/2022 21:12    Procedures Procedures    Medications Ordered in ED Medications - No data to display  ED Course/ Medical Decision Making/ A&P  Patient is a 67 year old male presenting with a head injury as described in the HPI.  Patient arrives with stable vital signs and is afebrile.  He is neurologically intact and remainder of physical examination is otherwise unremarkable.  CT  scan of the head obtained showing no acute intracranial abnormality.  Patient appears to have sustained a concussion.  He will be discharged with head precautions and return as needed.  Final Clinical Impression(s) / ED Diagnoses Final diagnoses:  None    Rx / DC Orders ED Discharge Orders     None         Geoffery Lyons, MD 12/23/22 2324

## 2022-12-23 NOTE — ED Triage Notes (Signed)
Dog jumped on pt Knocked pt into fridge Hit head on fridge Blacked out and fell on floor  Pt does not remember falling or hitting the floor Witnessed by family Pt stated he was on eliquis until last year.  No longer on blood thinners Takes 1, 81mg  ASA daily.   Pt denies nausea an dizziness

## 2022-12-23 NOTE — Discharge Instructions (Signed)
Take Tylenol 1000 mg every 6 hours as needed for pain.  Return to the emergency department if you develop severe headache, seizures/convulsions, nausea or vomiting, or for other new and concerning symptoms.

## 2023-01-05 ENCOUNTER — Ambulatory Visit: Payer: Medicare Other | Admitting: Family Medicine

## 2023-01-05 VITALS — BP 138/80 | HR 67 | Temp 97.7°F | Ht 72.0 in | Wt 191.0 lb

## 2023-01-05 DIAGNOSIS — E7800 Pure hypercholesterolemia, unspecified: Secondary | ICD-10-CM | POA: Diagnosis not present

## 2023-01-05 DIAGNOSIS — I10 Essential (primary) hypertension: Secondary | ICD-10-CM

## 2023-01-05 DIAGNOSIS — M10062 Idiopathic gout, left knee: Secondary | ICD-10-CM | POA: Diagnosis not present

## 2023-01-05 DIAGNOSIS — R42 Dizziness and giddiness: Secondary | ICD-10-CM | POA: Diagnosis not present

## 2023-01-05 DIAGNOSIS — I4819 Other persistent atrial fibrillation: Secondary | ICD-10-CM | POA: Diagnosis not present

## 2023-01-05 MED ORDER — ALLOPURINOL 100 MG PO TABS
ORAL_TABLET | ORAL | 2 refills | Status: DC
Start: 2023-01-05 — End: 2023-01-05

## 2023-01-05 MED ORDER — ALLOPURINOL 100 MG PO TABS
ORAL_TABLET | ORAL | 2 refills | Status: DC
Start: 2023-01-05 — End: 2023-11-21

## 2023-01-05 NOTE — Patient Instructions (Signed)
Stop supplement.  Continue your medications.  Follow up in 6 months.  Take care  Dr. Adriana Simas

## 2023-01-05 NOTE — Assessment & Plan Note (Signed)
In sinus rhythm today

## 2023-01-05 NOTE — Assessment & Plan Note (Signed)
Stable.  Continue verapamil, metoprolol, lisinopril, HCTZ.

## 2023-01-05 NOTE — Progress Notes (Signed)
Subjective:  Patient ID: Anthony [name], male    DOB: 1955-06-22  Age: 67 y.o. MRN: [id number]  CC:   Chief Complaint  Patient presents with   Dizziness    Intermittently for 3 months mostly I the mornings, reports ringing in ear     HPI:  67 year old male with the below mentioned medical problems presents for follow-up.  Patient reports recent dizziness.  Mostly in the mornings.  Patient states that he feels off balance.  When asked about new changes, patient recalls starting a gummy supplement from his wife that she used for sleep.  Denies chest pain or shortness of breath.  Hypertension stable on verapamil, metoprolol, lisinopril, and HCTZ.  Lipids at goal on Crestor and Zetia.  No recent gout flares.  Patient Active Problem List   Diagnosis Date Noted   Dizziness 01/05/2023   Trigger finger 12/29/2021   RLS (restless legs syndrome) 04/21/2021   Gout 12/24/2020   Erectile dysfunction 12/24/2020   Anaphylactic shock due to adverse food reaction 06/02/2017   OSA on CPAP    Hyperlipidemia    History of kidney stones    GERD (gastroesophageal reflux disease)    Persistent atrial fibrillation (HCC)    Essential hypertension, benign 06/02/2012    Social Hx   Social History   Socioeconomic History   Marital status: Married    Spouse name: Not on file   Number of children: Not on file   Years of education: Not on file   Highest education level: Not on file  Occupational History   Not on file  Tobacco Use   Smoking status: Never   Smokeless tobacco: Never  Vaping Use   Vaping status: Never Used  Substance and Sexual Activity   Alcohol use: No   Drug use: No   Sexual activity: Yes  Other Topics Concern   Not on file  Social History Narrative   Lives in [place]   Works at [employer] and [employer]   Married   Social Drivers of Health   Financial Resource Strain: Low Risk  (10/29/2022)   Overall Financial Resource Strain (CARDIA)    Difficulty of Paying  Living Expenses: Not hard at all  Food Insecurity: No Food Insecurity (10/29/2022)   Hunger Vital Sign    Worried About Running Out of Food in the Last Year: Never true    Ran Out of Food in the Last Year: Never true  Transportation Needs: No Transportation Needs (10/29/2022)   PRAPARE - Administrator, Civil Service (Medical): No    Lack of Transportation (Non-Medical): No  Physical Activity: Insufficiently Active (10/29/2022)   Exercise Vital Sign    Days of Exercise per Week: 3 days    Minutes of Exercise per Session: 30 min  Stress: No Stress Concern Present (10/29/2022)   [employer] of Occupational Health - Occupational Stress Questionnaire    Feeling of Stress : Not at all  Social Connections: Moderately Isolated (10/29/2022)   Social Connection and Isolation Panel [NHANES]    Frequency of Communication with Friends and Family: More than three times a week    Frequency of Social Gatherings with Friends and Family: More than three times a week    Attends Religious Services: Never    Database administrator or Organizations: No    Attends Banker Meetings: Never    Marital Status: Married    Review of Systems Per HPI  Objective:  BP 138/80   Pulse  67   Temp 97.7 F (36.5 C)   Ht 6' (1.829 m)   Wt 191 lb (86.6 kg)   SpO2 98%   BMI 25.90 kg/m      01/05/2023    8:29 AM 12/23/2022   11:15 PM 12/23/2022    9:00 PM  BP/Weight  Systolic BP 138 155 144  Diastolic BP 80 73 77  Wt. (Lbs) 191    BMI 25.9 kg/m2      Physical Exam Vitals and nursing note reviewed.  Constitutional:      General: He is not in acute distress.    Appearance: Normal appearance.  HENT:     Head: Normocephalic and atraumatic.  Eyes:     General:        Right eye: No discharge.        Left eye: No discharge.     Conjunctiva/sclera: Conjunctivae normal.  Cardiovascular:     Rate and Rhythm: Normal rate and regular rhythm.  Pulmonary:     Effort: Pulmonary  effort is normal.     Breath sounds: Normal breath sounds. No wheezing, rhonchi or rales.  Neurological:     Mental Status: He is alert.  Psychiatric:        Mood and Affect: Mood normal.        Behavior: Behavior normal.     Lab Results  Component Value Date   WBC 6.1 07/16/2022   HGB 13.8 07/16/2022   HCT 40.9 07/16/2022   PLT 153 07/16/2022   GLUCOSE 80 08/25/2022   CHOL 109 07/16/2022   TRIG 61 07/16/2022   HDL 39 (L) 07/16/2022   LDLCALC 56 07/16/2022   ALT 24 07/16/2022   AST 22 07/16/2022   NA 140 08/25/2022   K 3.9 08/25/2022   CL 105 08/25/2022   CREATININE 1.12 08/25/2022   BUN 15 08/25/2022   CO2 28 08/25/2022   TSH 2.720 04/08/2016   PSA 0.7 03/29/2014     Assessment & Plan:   Problem List Items Addressed This Visit       Cardiovascular and Mediastinum   Essential hypertension, benign - Primary   Stable.  Continue verapamil, metoprolol, lisinopril, HCTZ.      Persistent atrial fibrillation (HCC)   In sinus rhythm today.        Other   Dizziness   Likely related to supplement.  Advised to stop.      Gout   Stable.  No recent flares.      Relevant Medications   allopurinol (ZYLOPRIM) 100 MG tablet   Hyperlipidemia   At goal on Crestor and Zetia.  Continue.       Meds ordered this encounter  Medications   DISCONTD: allopurinol (ZYLOPRIM) 100 MG tablet    Sig: TAKE 2 TABLETS BY MOUTH DAILY    Dispense:  200 tablet    Refill:  2    Please send a replace/new response with 100-Day Supply if appropriate to maximize member benefit. Requesting 1 year supply.   allopurinol (ZYLOPRIM) 100 MG tablet    Sig: TAKE 2 TABLETS BY MOUTH DAILY    Dispense:  200 tablet    Refill:  2    Please send a replace/new response with 100-Day Supply if appropriate to maximize member benefit. Requesting 1 year supply.    Follow-up:  Return in about 6 months (around 07/06/2023) for Follow up Chronic medical issues.  Everlene Other DO [facility]

## 2023-01-05 NOTE — Assessment & Plan Note (Signed)
At goal on Crestor and Zetia.  Continue.

## 2023-01-05 NOTE — Assessment & Plan Note (Signed)
Likely related to supplement.  Advised to stop.

## 2023-01-05 NOTE — Assessment & Plan Note (Signed)
Stable. No recent flares.  

## 2023-01-06 ENCOUNTER — Ambulatory Visit
Admission: EM | Admit: 2023-01-06 | Discharge: 2023-01-06 | Disposition: A | Discharge status: Home / Self Care | Payer: Medicare Other | Attending: Nurse Practitioner | Admitting: Nurse Practitioner

## 2023-01-06 DIAGNOSIS — U071 COVID-19: Secondary | ICD-10-CM | POA: Diagnosis not present

## 2023-01-06 DIAGNOSIS — R42 Dizziness and giddiness: Secondary | ICD-10-CM

## 2023-01-06 LAB — POC COVID19/FLU A&B COMBO
Covid Antigen, POC: POSITIVE — AB
Influenza A Antigen, POC: NEGATIVE
Influenza B Antigen, POC: NEGATIVE

## 2023-01-06 MED ORDER — ACETAMINOPHEN 325 MG PO TABS
650.0000 mg | ORAL_TABLET | Freq: Once | ORAL | Status: AC
  Administered 2023-01-06: 650 mg via ORAL

## 2023-01-06 MED ORDER — PAXLOVID (300/100) 20 X 150 MG & 10 X 100MG PO TBPK
3.0000 | ORAL_TABLET | Freq: Two times a day (BID) | ORAL | 0 refills | Status: AC
Start: 2023-01-06 — End: 2023-01-11

## 2023-01-06 NOTE — ED Notes (Signed)
Pt appeared to be stumbling upon getting his name called from waiting area. States he has been feeling this way since he woke up this morning.
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
## 2023-01-06 NOTE — ED Triage Notes (Signed)
Pt reports a sudden onset of dizziness this morning along with a fever, cough, and a headache x 1 day.

## 2023-01-06 NOTE — Discharge Instructions (Addendum)
You tested positive for COVID-19 today.  Symptoms should improve over the next week to 10 days.  If you develop chest pain or shortness of breath, go to the emergency room.  Start taking Paxlovid as prescribed to treat COVID-19 and prevent hospitalization.  Stop verapamil and Crestor while you are taking Paxlovid.  You can resume the medicines after you finish Paxlovid.  Some things that can make you feel better are: - Increased rest - Increasing fluid with water/sugar free electrolytes - Acetaminophen and ibuprofen as needed for fever/pain - Salt water gargling, chloraseptic spray and throat lozenges - OTC guaifenesin (Mucinex) 600 mg twice daily - Saline sinus flushes or a neti pot - Humidifying the air - OTC Coricidin products to help with symptom management  If dizziness worsens or does not improve, please seek emergent care.

## 2023-03-17 ENCOUNTER — Other Ambulatory Visit: Payer: Self-pay | Admitting: Family Medicine

## 2023-03-17 DIAGNOSIS — K219 Gastro-esophageal reflux disease without esophagitis: Secondary | ICD-10-CM

## 2023-03-17 DIAGNOSIS — I10 Essential (primary) hypertension: Secondary | ICD-10-CM

## 2023-06-19 ENCOUNTER — Other Ambulatory Visit: Payer: Self-pay | Admitting: Family Medicine

## 2023-07-03 ENCOUNTER — Other Ambulatory Visit: Payer: Self-pay | Admitting: Family Medicine

## 2023-07-03 DIAGNOSIS — I10 Essential (primary) hypertension: Secondary | ICD-10-CM

## 2023-07-03 DIAGNOSIS — E7800 Pure hypercholesterolemia, unspecified: Secondary | ICD-10-CM

## 2023-07-05 ENCOUNTER — Other Ambulatory Visit: Payer: Self-pay

## 2023-07-05 DIAGNOSIS — I10 Essential (primary) hypertension: Secondary | ICD-10-CM

## 2023-07-05 DIAGNOSIS — E7800 Pure hypercholesterolemia, unspecified: Secondary | ICD-10-CM

## 2023-07-05 MED ORDER — LISINOPRIL 20 MG PO TABS
20.0000 mg | ORAL_TABLET | Freq: Two times a day (BID) | ORAL | 2 refills | Status: DC
Start: 2023-07-05 — End: 2023-07-05

## 2023-07-05 MED ORDER — EZETIMIBE 10 MG PO TABS
10.0000 mg | ORAL_TABLET | Freq: Every day | ORAL | 2 refills | Status: DC
Start: 2023-07-05 — End: 2023-07-08

## 2023-07-05 MED ORDER — LISINOPRIL 20 MG PO TABS
20.0000 mg | ORAL_TABLET | Freq: Two times a day (BID) | ORAL | 2 refills | Status: DC
Start: 2023-07-05 — End: 2023-07-08

## 2023-07-05 MED ORDER — EZETIMIBE 10 MG PO TABS
10.0000 mg | ORAL_TABLET | Freq: Every day | ORAL | 2 refills | Status: DC
Start: 2023-07-05 — End: 2023-07-05

## 2023-07-06 ENCOUNTER — Ambulatory Visit: Payer: Medicare Other | Admitting: Family Medicine

## 2023-07-06 VITALS — BP 138/78 | HR 51 | Temp 97.7°F | Ht 72.0 in | Wt 190.2 lb

## 2023-07-06 DIAGNOSIS — T7800XD Anaphylactic reaction due to unspecified food, subsequent encounter: Secondary | ICD-10-CM

## 2023-07-06 DIAGNOSIS — Z130 Encounter for screening for diseases of the blood and blood-forming organs and certain disorders involving the immune mechanism: Secondary | ICD-10-CM

## 2023-07-06 DIAGNOSIS — I10 Essential (primary) hypertension: Secondary | ICD-10-CM

## 2023-07-06 DIAGNOSIS — I4891 Unspecified atrial fibrillation: Secondary | ICD-10-CM

## 2023-07-06 DIAGNOSIS — Z125 Encounter for screening for malignant neoplasm of prostate: Secondary | ICD-10-CM

## 2023-07-06 DIAGNOSIS — E7800 Pure hypercholesterolemia, unspecified: Secondary | ICD-10-CM | POA: Diagnosis not present

## 2023-07-06 DIAGNOSIS — N529 Male erectile dysfunction, unspecified: Secondary | ICD-10-CM | POA: Diagnosis not present

## 2023-07-06 MED ORDER — HYDROCHLOROTHIAZIDE 25 MG PO TABS
25.0000 mg | ORAL_TABLET | Freq: Every day | ORAL | 3 refills | Status: AC
Start: 2023-07-06 — End: ?

## 2023-07-06 MED ORDER — EPINEPHRINE 0.3 MG/0.3ML IJ SOAJ: 0.3000 mg | INTRAMUSCULAR | 1 refills | Status: DC | PRN

## 2023-07-06 MED ORDER — ALBUTEROL SULFATE HFA 108 (90 BASE) MCG/ACT IN AERS: 1.0000 | INHALATION_SPRAY | Freq: Four times a day (QID) | RESPIRATORY_TRACT | 3 refills | Status: DC | PRN

## 2023-07-06 MED ORDER — TADALAFIL 20 MG PO TABS
20.0000 mg | ORAL_TABLET | Freq: Every day | ORAL | 11 refills | Status: DC | PRN
Start: 2023-07-06 — End: 2023-07-08

## 2023-07-06 NOTE — Assessment & Plan Note (Signed)
 Stable.  Continue current medications.

## 2023-07-06 NOTE — Assessment & Plan Note (Signed)
Lipid panel today.  Continue Crestor and Zetia.

## 2023-07-06 NOTE — Patient Instructions (Signed)
Labs today.  Medications refilled.  Follow up in 6 months. 

## 2023-07-06 NOTE — Assessment & Plan Note (Signed)
 --  Stable.  Continue metoprolol.

## 2023-07-06 NOTE — Progress Notes (Signed)
 Subjective:  Patient ID: Anthony [name], male    DOB: [birth year]  Age: 68 y.o. MRN: [id number]  CC:  Follow up   HPI:  68 year old male presents for follow-up.  Hypertension stable.  Follows with cardiology regarding atrial fibrillation.  Stable.  No anticoagulation.  Hyperlipidemia has been well-controlled.  Tolerating Crestor  and Zetia .  Needs lipid panel today.  Patient states that he continues to have difficulty with erectile dysfunction.  Trouble getting and maintaining an erection.  Has not responded to previously prescribed medication.  Will discuss treatment options today.  Patient Active Problem List   Diagnosis Date Noted   Trigger finger 12/29/2021   RLS (restless legs syndrome) 04/21/2021   Gout 12/24/2020   Erectile dysfunction 12/24/2020   Anaphylactic shock due to adverse food reaction 06/02/2017   OSA on CPAP    Hyperlipidemia    History of kidney stones    GERD (gastroesophageal reflux disease)    Atrial fibrillation (HCC)    Essential hypertension, benign 06/02/2012    Social Hx   Social History   Socioeconomic History   Marital status: Married    Spouse name: Not on file   Number of children: Not on file   Years of education: Not on file   Highest education level: Not on file  Occupational History   Not on file  Tobacco Use   Smoking status: Never   Smokeless tobacco: Never  Vaping Use   Vaping status: Never Used  Substance and Sexual Activity   Alcohol use: No   Drug use: No   Sexual activity: Yes  Other Topics Concern   Not on file  Social History Narrative   Lives in [place]   Works at [employer] and [place]   Married   Social Drivers of Health   Financial Resource Strain: Low Risk  (10/29/2022)   Overall Financial Resource Strain (CARDIA)    Difficulty of Paying Living Expenses: Not hard at all  Food Insecurity: No Food Insecurity (10/29/2022)   Hunger Vital Sign    Worried About Running Out of Food in the Last Year: Never  true    Ran Out of Food in the Last Year: Never true  Transportation Needs: No Transportation Needs (10/29/2022)   PRAPARE - Administrator, Civil Service (Medical): No    Lack of Transportation (Non-Medical): No  Physical Activity: Insufficiently Active (10/29/2022)   Exercise Vital Sign    Days of Exercise per Week: 3 days    Minutes of Exercise per Session: 30 min  Stress: No Stress Concern Present (10/29/2022)   [employer] of Occupational Health - Occupational Stress Questionnaire    Feeling of Stress : Not at all  Social Connections: Moderately Isolated (10/29/2022)   Social Connection and Isolation Panel    Frequency of Communication with Friends and Family: More than three times a week    Frequency of Social Gatherings with Friends and Family: More than three times a week    Attends Religious Services: Never    Database administrator or Organizations: No    Attends Engineer, structural: Never    Marital Status: Married    Review of Systems Per HPI  Objective:  BP 138/78   Pulse (!) 51   Temp 97.7 F (36.5 C)   Ht 6' (1.829 m)   Wt 190 lb 3.2 oz (86.3 kg)   SpO2 95%   BMI 25.80 kg/m      07/06/2023  8:22 AM 01/06/2023    1:56 PM 01/06/2023    1:11 PM  BP/Weight  Systolic BP 138 134 197  Diastolic BP 78 78 71  Wt. (Lbs) 190.2    BMI 25.8 kg/m2      Physical Exam Vitals and nursing note reviewed.  Constitutional:      General: He is not in acute distress.    Appearance: Normal appearance.  HENT:     Head: Normocephalic and atraumatic.   Eyes:     General:        Right eye: No discharge.        Left eye: No discharge.     Conjunctiva/sclera: Conjunctivae normal.    Cardiovascular:     Rate and Rhythm: Regular rhythm. Bradycardia present.  Pulmonary:     Effort: Pulmonary effort is normal.     Breath sounds: Normal breath sounds. No wheezing, rhonchi or rales.   Neurological:     Mental Status: He is alert.    Psychiatric:        Mood and Affect: Mood normal.        Behavior: Behavior normal.     Lab Results  Component Value Date   WBC 6.1 07/16/2022   HGB 13.8 07/16/2022   HCT 40.9 07/16/2022   PLT 153 07/16/2022   GLUCOSE 80 08/25/2022   CHOL 109 07/16/2022   TRIG 61 07/16/2022   HDL 39 (L) 07/16/2022   LDLCALC 56 07/16/2022   ALT 24 07/16/2022   AST 22 07/16/2022   NA 140 08/25/2022   K 3.9 08/25/2022   CL 105 08/25/2022   CREATININE 1.12 08/25/2022   BUN 15 08/25/2022   CO2 28 08/25/2022   TSH 2.720 04/08/2016   PSA 0.7 03/29/2014     Assessment & Plan:  Essential hypertension, benign Assessment & Plan: Stable.  Continue current medications.  Orders: -     hydroCHLOROthiazide ; Take 1 tablet (25 mg total) by mouth daily.  Dispense: 100 tablet; Refill: 3 -     CMP14+EGFR -     Microalbumin / creatinine urine ratio  Pure hypercholesterolemia Assessment & Plan: Lipid panel today. Continue Crestor  and Zetia .   Orders: -     Lipid panel  Screening for deficiency anemia -     CBC  Screening PSA (prostate specific antigen) -     PSA  Atrial fibrillation, unspecified type (HCC) Assessment & Plan: Stable.  Continue metoprolol .   Erectile dysfunction, unspecified erectile dysfunction type Assessment & Plan: Max dose Tadalafil .   Orders: -     Tadalafil ; Take 1 tablet (20 mg total) by mouth daily as needed for erectile dysfunction.  Dispense: 10 tablet; Refill: 11  Anaphylactic shock due to food, subsequent encounter -     EPINEPHrine ; Inject 0.3 mg into the muscle as needed for anaphylaxis. Use as directed for severe allergic reaction  Dispense: 1 each; Refill: 1  Other orders -     Albuterol  Sulfate HFA; Inhale 1-2 puffs into the lungs every 6 (six) hours as needed for wheezing or shortness of breath.  Dispense: 18 g; Refill: 3    Follow-up:  6 months  Tysheena Ginzburg Debrah Fan DO [facility]

## 2023-07-06 NOTE — Assessment & Plan Note (Signed)
 Max dose Tadalafil .

## 2023-07-07 LAB — LIPID PANEL
Chol/HDL Ratio: 2.8 ratio (ref 0.0–5.0)
Cholesterol, Total: 102 mg/dL (ref 100–199)
HDL: 37 mg/dL — ABNORMAL LOW (ref >39)
LDL Chol Calc (NIH): 47 mg/dL (ref 0–99)
Triglycerides: 94 mg/dL (ref 0–149)
VLDL Cholesterol Cal: 18 mg/dL (ref 5–40)

## 2023-07-07 LAB — CMP14+EGFR
ALT: 23 IU/L (ref 0–44)
AST: 23 IU/L (ref 0–40)
Albumin: 4.4 g/dL (ref 3.9–4.9)
Alkaline Phosphatase: 71 IU/L (ref 44–121)
BUN/Creatinine Ratio: 9 — ABNORMAL LOW (ref 10–24)
BUN: 11 mg/dL (ref 8–27)
Bilirubin Total: 0.7 mg/dL (ref 0.0–1.2)
CO2: 22 mmol/L (ref 20–29)
Calcium: 9.6 mg/dL (ref 8.6–10.2)
Chloride: 105 mmol/L (ref 96–106)
Creatinine, Ser: 1.19 mg/dL (ref 0.76–1.27)
Globulin, Total: 1.9 g/dL (ref 1.5–4.5)
Glucose: 90 mg/dL (ref 70–99)
Potassium: 4.8 mmol/L (ref 3.5–5.2)
Sodium: 145 mmol/L — ABNORMAL HIGH (ref 134–144)
Total Protein: 6.3 g/dL (ref 6.0–8.5)
eGFR: 67 mL/min/{1.73_m2} (ref >59)

## 2023-07-07 LAB — CBC
Hematocrit: 45.2 % (ref 37.5–51.0)
Hemoglobin: 14.7 g/dL (ref 13.0–17.7)
MCH: 31.8 pg (ref 26.6–33.0)
MCHC: 32.5 g/dL (ref 31.5–35.7)
MCV: 98 fL — ABNORMAL HIGH (ref 79–97)
Platelets: 185 10*3/uL (ref 150–450)
RBC: 4.62 x10E6/uL (ref 4.14–5.80)
RDW: 13 % (ref 11.6–15.4)
WBC: 6.9 10*3/uL (ref 3.4–10.8)

## 2023-07-07 LAB — MICROALBUMIN / CREATININE URINE RATIO
Creatinine, Urine: 100.1 mg/dL
Microalb/Creat Ratio: 4 mg/g{creat} (ref 0–29)
Microalbumin, Urine: 3.9 ug/mL

## 2023-07-07 LAB — PSA: Prostate Specific Ag, Serum: 1.3 ng/mL (ref 0.0–4.0)

## 2023-07-08 ENCOUNTER — Other Ambulatory Visit: Payer: Self-pay | Admitting: Cardiovascular Disease

## 2023-07-08 ENCOUNTER — Other Ambulatory Visit: Payer: Self-pay | Admitting: Family Medicine

## 2023-07-08 DIAGNOSIS — N529 Male erectile dysfunction, unspecified: Secondary | ICD-10-CM

## 2023-07-08 DIAGNOSIS — E7800 Pure hypercholesterolemia, unspecified: Secondary | ICD-10-CM

## 2023-07-08 DIAGNOSIS — I10 Essential (primary) hypertension: Secondary | ICD-10-CM

## 2023-07-08 MED ORDER — TADALAFIL 20 MG PO TABS
20.0000 mg | ORAL_TABLET | Freq: Every day | ORAL | 11 refills | Status: AC | PRN
Start: 2023-07-08 — End: ?

## 2023-07-08 NOTE — Telephone Encounter (Signed)
 Copied from CRM [id number]. Topic: Clinical - Prescription Issue >> Jul 08, 2023 11:18 AM Zipporah Him wrote: Reason for CRM: States his [facility] home delivery will only fill  cialis  for 5 pills. Wants to know if that prescription can be sent over to [facility] in [place], on [address]

## 2023-07-10 ENCOUNTER — Ambulatory Visit: Payer: Self-pay | Admitting: Family Medicine

## 2023-07-28 ENCOUNTER — Other Ambulatory Visit: Payer: Self-pay

## 2023-07-28 DIAGNOSIS — T7800XD Anaphylactic reaction due to unspecified food, subsequent encounter: Secondary | ICD-10-CM

## 2023-07-28 MED ORDER — EPINEPHRINE 0.3 MG/0.3ML IJ SOAJ
INTRAMUSCULAR | 1 refills | Status: AC
Start: 2023-07-28 — End: ?

## 2023-08-01 ENCOUNTER — Other Ambulatory Visit: Payer: Self-pay

## 2023-08-01 ENCOUNTER — Other Ambulatory Visit: Payer: Self-pay | Admitting: Family Medicine

## 2023-08-01 DIAGNOSIS — T7800XD Anaphylactic reaction due to unspecified food, subsequent encounter: Secondary | ICD-10-CM

## 2023-08-08 ENCOUNTER — Ambulatory Visit
Admission: EM | Admit: 2023-08-08 | Discharge: 2023-08-08 | Disposition: A | Discharge status: Home / Self Care | Attending: Family Medicine | Admitting: Family Medicine

## 2023-08-08 DIAGNOSIS — L237 Allergic contact dermatitis due to plants, except food: Secondary | ICD-10-CM

## 2023-08-08 MED ORDER — TRIAMCINOLONE ACETONIDE 0.1 % EX CREA: 1.0000 | TOPICAL_CREAM | Freq: Two times a day (BID) | CUTANEOUS | 0 refills | Status: AC

## 2023-08-08 MED ORDER — METHYLPREDNISOLONE SODIUM SUCC 125 MG IJ SOLR
125.0000 mg | Freq: Once | INTRAMUSCULAR | Status: AC
  Administered 2023-08-08: 125 mg via INTRAMUSCULAR

## 2023-08-08 NOTE — ED Triage Notes (Signed)
 Reports rash on both legs,that are itchy, pt states he has tried OTC meds for itching has found no relief.
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
## 2023-08-10 ENCOUNTER — Other Ambulatory Visit: Payer: Self-pay | Admitting: Cardiovascular Disease

## 2023-08-19 ENCOUNTER — Ambulatory Visit: Attending: Cardiovascular Disease | Admitting: Cardiovascular Disease

## 2023-08-19 ENCOUNTER — Encounter: Payer: Self-pay | Admitting: Cardiovascular Disease

## 2023-08-19 VITALS — BP 155/79 | HR 64 | Ht 72.0 in | Wt 188.0 lb

## 2023-08-19 DIAGNOSIS — I4819 Other persistent atrial fibrillation: Secondary | ICD-10-CM | POA: Diagnosis not present

## 2023-08-19 NOTE — Progress Notes (Signed)
   PCP: Cook, Jayce G, DO   Primary EP: Dr Nancey  Anthony [name] is a 68 y.o. male who presents today for routine electrophysiology followup.  Since last being seen in our clinic, the patient reports doing very well.    He has a history of persistent atrial fibrillation and underwent AF ablation by Dr. Kelsie in July 2018.  At that time, he was also noted to be in typical atrial flutter, so flutter ablation was performed as well.  Prior to the ablation, he experienced palpitations during episodes of atrial fibrillation.  Since the ablation, he has not had recurrence of palpitations.  He has an Scientist, physiological and monitors his rhythm.  Today, he denies symptoms of palpitations, chest pain, shortness of breath,  lower extremity edema, dizziness, presyncope, or syncope.  The patient is otherwise without complaint today.   Physical Exam: Vitals:   08/19/23 1051  BP: (!) 155/79  Pulse: 64  SpO2: 98%  Weight: 188 lb (85.3 kg)  Height: 6' (1.829 m)    Wt Readings from Last 3 Encounters:  08/19/23 188 lb (85.3 kg)  07/06/23 190 lb 3.2 oz (86.3 kg)  01/05/23 191 lb (86.6 kg)   Gen: Appears comfortable, well-nourished CV: RRR, no dependent edema Pulm: breathing easily  EKG Interpretation Date/Time:  Friday August 19 2023 11:02:33 EDT Ventricular Rate:  64 PR Interval:  180 QRS Duration:  108 QT Interval:  448 QTC Calculation: 462 R Axis:   -19  Text Interpretation: Sinus rhythm with occasional Premature ventricular complexes Moderate voltage criteria for LVH, may be normal variant ( R in aVL , Cornell product ) When compared with ECG of 23-Jul-2022 15:26, Premature ventricular complexes are now Present QRS axis Shifted right Confirmed by Nancey Scotts [phone]) on 08/19/2023 11:34:35 AM   Assessment and Plan:  Paroxysmal atrial fibrillation Well controlled He is monitoring his heart rhythm with an apple watch. He has not had any detections. He discontinued Eliquis , which is  reasonable given that he has not had recurrence of atrial fibrillation and is monitoring his rhythm with the device.  2. HTN Stable No change required today  3. OSA He is not compliant with CPAP  Return in a year  Scotts FORBES Nancey, MD 08/19/2023 11:34 AM

## 2023-08-19 NOTE — Patient Instructions (Signed)

## 2023-08-24 ENCOUNTER — Other Ambulatory Visit: Payer: Self-pay | Admitting: Cardiovascular Disease

## 2023-09-01 ENCOUNTER — Encounter: Payer: Self-pay | Admitting: Surgery

## 2023-09-01 ENCOUNTER — Ambulatory Visit (INDEPENDENT_AMBULATORY_CARE_PROVIDER_SITE_OTHER): Admitting: Surgery

## 2023-09-01 ENCOUNTER — Other Ambulatory Visit: Payer: Self-pay

## 2023-09-01 VITALS — BP 131/79 | HR 50 | Temp 97.6°F | Resp 18 | Ht 72.0 in | Wt 190.0 lb

## 2023-09-01 DIAGNOSIS — R1033 Periumbilical pain: Secondary | ICD-10-CM | POA: Diagnosis not present
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
## 2023-11-04 ENCOUNTER — Ambulatory Visit (INDEPENDENT_AMBULATORY_CARE_PROVIDER_SITE_OTHER): Payer: Medicare Other

## 2023-11-04 VITALS — Ht 72.0 in | Wt 190.0 lb

## 2023-11-04 DIAGNOSIS — Z0000 Encounter for general adult medical examination without abnormal findings: Secondary | ICD-10-CM
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
## 2023-11-04 NOTE — Patient Instructions (Signed)
 [name],  Thank you for taking the time for your Medicare Wellness Visit. I appreciate your continued commitment to your health goals. Please review the care plan we discussed, and feel free to reach out if I can assist you further.  Medicare recommends these wellness visits once per year to help you and your care team stay ahead of potential health issues. These visits are designed to focus on prevention, allowing your provider to concentrate on managing your acute and chronic conditions during your regular appointments.  Please note that Annual Wellness Visits do not include a physical exam. Some assessments may be limited, especially if the visit was conducted virtually. If needed, we may recommend a separate in-person follow-up with your provider.  Ongoing Care Seeing your primary care provider every 3 to 6 months helps us  monitor your health and provide consistent, personalized care.   Referrals If a referral was made during today's visit and you haven't received any updates within two weeks, please contact the referred provider directly to check on the status.  Recommended Screenings:  Health Maintenance  Topic Date Due   Zoster (Shingles) Vaccine (2 of 2) 12/22/2023*   Flu Shot  04/17/2024*   Medicare Annual Wellness Visit  11/03/2024   DTaP/Tdap/Td vaccine (2 - Td or Tdap) 03/28/2029   Colon Cancer Screening  08/29/2029   Pneumococcal Vaccine for age over 38  Completed   Meningitis B Vaccine  Aged Out   COVID-19 Vaccine  Discontinued   Hepatitis C Screening  Discontinued  *Topic was postponed. The date shown is not the original due date.       11/04/2023    8:51 AM  Advanced Directives  Does Patient Have a Medical Advance Directive? No  Would patient like information on creating a medical advance directive? Yes (MAU/Ambulatory/Procedural Areas - Information given)   Advance Care Planning is important because it: Ensures you receive medical care that aligns with your  values, goals, and preferences. Provides guidance to your family and loved ones, reducing the emotional burden of decision-making during critical moments.  Information on Advanced Care Planning can be found at [place]  Secretary of [facility] Advance Health Care Directives Advance Health Care Directives ([email or web address]   Vision: Annual vision screenings are recommended for early detection of glaucoma, cataracts, and diabetic retinopathy. These exams can also reveal signs of chronic conditions such as diabetes and high blood pressure.  Dental: Annual dental screenings help detect early signs of oral cancer, gum disease, and other conditions linked to overall health, including heart disease and diabetes.  Please see the attached documents for additional preventive care recommendations.

## 2023-11-19 ENCOUNTER — Other Ambulatory Visit: Payer: Self-pay | Admitting: Family Medicine

## 2023-11-19 DIAGNOSIS — K219 Gastro-esophageal reflux disease without esophagitis: Secondary | ICD-10-CM

## 2023-11-19 DIAGNOSIS — I10 Essential (primary) hypertension: Secondary | ICD-10-CM

## 2023-11-19 DIAGNOSIS — M10062 Idiopathic gout, left knee: Secondary | ICD-10-CM

## 2024-01-05 ENCOUNTER — Ambulatory Visit: Admitting: Family Medicine

## 2024-01-05 ENCOUNTER — Encounter: Payer: Self-pay | Admitting: Family Medicine

## 2024-01-05 VITALS — BP 118/80 | HR 60 | Temp 97.7°F | Ht 72.0 in | Wt 188.0 lb

## 2024-01-05 DIAGNOSIS — E7800 Pure hypercholesterolemia, unspecified: Secondary | ICD-10-CM | POA: Diagnosis not present

## 2024-01-05 DIAGNOSIS — I10 Essential (primary) hypertension: Secondary | ICD-10-CM

## 2024-01-05 DIAGNOSIS — M1A9XX0 Chronic gout, unspecified, without tophus (tophi): Secondary | ICD-10-CM | POA: Diagnosis not present

## 2024-01-05 DIAGNOSIS — K219 Gastro-esophageal reflux disease without esophagitis: Secondary | ICD-10-CM

## 2024-01-05 DIAGNOSIS — I4891 Unspecified atrial fibrillation: Secondary | ICD-10-CM | POA: Diagnosis not present

## 2024-01-05 NOTE — Assessment & Plan Note (Signed)
 Stable.  Continue current medications.

## 2024-01-05 NOTE — Assessment & Plan Note (Signed)
 Stable

## 2024-01-05 NOTE — Assessment & Plan Note (Signed)
At goal.  Continue Crestor and Zetia.

## 2024-01-05 NOTE — Patient Instructions (Signed)
 Merry Christmas  Follow up in 6 months.

## 2024-01-05 NOTE — Assessment & Plan Note (Signed)
 --  Stable.  Continue metoprolol.

## 2024-01-05 NOTE — Progress Notes (Signed)
 Subjective:  Patient ID: Anthony [name], male    DOB: May 10, 1955  Age: 68 y.o. MRN: [id number]  CC:   Chief Complaint  Patient presents with   Hypertension   HPI:  68 year old male presents for follow-up.  Patient is feeling well.  No chest pain shortness of breath.  A-fib stable.  Hypertension stable.  Lipids well-controlled.  No recent gout flares.    He is feeling well.  He has no concerns at this time.  Patient Active Problem List   Diagnosis Date Noted   Trigger finger 12/29/2021   RLS (restless legs syndrome) 04/21/2021   Gout 12/24/2020   Erectile dysfunction 12/24/2020   Anaphylactic shock due to adverse food reaction 06/02/2017   OSA on CPAP    Hyperlipidemia    History of kidney stones    GERD (gastroesophageal reflux disease)    Atrial fibrillation (HCC)    Essential hypertension, benign 06/02/2012    Social Hx   Social History   Socioeconomic History   Marital status: Married    Spouse name: Not on file   Number of children: Not on file   Years of education: Not on file   Highest education level: Associate degree: academic program  Occupational History   Not on file  Tobacco Use   Smoking status: Never    Passive exposure: Never   Smokeless tobacco: Never  Vaping Use   Vaping status: Never Used  Substance and Sexual Activity   Alcohol use: No   Drug use: No   Sexual activity: Yes  Other Topics Concern   Not on file  Social History Narrative   Lives in [place]   Works at [employer]   Married   Social Drivers of Health   Tobacco Use: Low Risk (01/05/2024)   Patient History    Smoking Tobacco Use: Never    Smokeless Tobacco Use: Never    Passive Exposure: Never  Financial Resource Strain: Low Risk (11/03/2023)   Overall Financial Resource Strain (CARDIA)    Difficulty of Paying Living Expenses: Not very hard  Food Insecurity: No Food Insecurity (11/03/2023)   Epic    Worried About Programme Researcher, Broadcasting/film/video in the Last Year:  Never true    Ran Out of Food in the Last Year: Never true  Transportation Needs: No Transportation Needs (11/03/2023)   Epic    Lack of Transportation (Medical): No    Lack of Transportation (Non-Medical): No  Physical Activity: Sufficiently Active (11/03/2023)   Exercise Vital Sign    Days of Exercise per Week: 7 days    Minutes of Exercise per Session: 60 min  Stress: No Stress Concern Present (11/03/2023)   [employer] of Occupational Health - Occupational Stress Questionnaire    Feeling of Stress: Not at all  Social Connections: Moderately Integrated (11/03/2023)   Social Connection and Isolation Panel    Frequency of Communication with Friends and Family: More than three times a week    Frequency of Social Gatherings with Friends and Family: More than three times a week    Attends Religious Services: More than 4 times per year    Active Member of Clubs or Organizations: Patient declined    Attends Banker Meetings: Never    Marital Status: Married  Depression (PHQ2-9): Low Risk (01/05/2024)   Depression (PHQ2-9)    PHQ-2 Score: 0  Alcohol Screen: Low Risk (11/03/2023)   Alcohol Screen    Last Alcohol Screening Score (AUDIT): 0  Housing: Low Risk (11/03/2023)   Epic    Unable to Pay for Housing in the Last Year: No    Number of Times Moved in the Last Year: 0    Homeless in the Last Year: No  Utilities: Not At Risk (11/04/2023)   Epic    Threatened with loss of utilities: No  Health Literacy: Adequate Health Literacy (11/04/2023)   B1300 Health Literacy    Frequency of need for help with medical instructions: Never    Review of Systems Per HPI  Objective:  BP 118/80   Pulse 60   Temp 97.7 F (36.5 C)   Ht 6' (1.829 m)   Wt 188 lb (85.3 kg)   SpO2 96%   BMI 25.50 kg/m      01/05/2024    8:28 AM 01/05/2024    8:12 AM 11/04/2023    8:50 AM  BP/Weight  Systolic BP 118 147 --  Diastolic BP 80 75 --  Wt. (Lbs)  188 190  BMI  25.5 kg/m2  25.77 kg/m2    Physical Exam Vitals and nursing note reviewed.  Constitutional:      General: He is not in acute distress.    Appearance: Normal appearance.  HENT:     Head: Normocephalic and atraumatic.  Eyes:     General:        Right eye: No discharge.        Left eye: No discharge.     Conjunctiva/sclera: Conjunctivae normal.  Cardiovascular:     Rate and Rhythm: Normal rate and regular rhythm.  Pulmonary:     Effort: Pulmonary effort is normal.     Breath sounds: Normal breath sounds. No wheezing, rhonchi or rales.  Neurological:     Mental Status: He is alert.  Psychiatric:        Mood and Affect: Mood normal.        Behavior: Behavior normal.     Lab Results  Component Value Date   WBC 6.9 07/06/2023   HGB 14.7 07/06/2023   HCT 45.2 07/06/2023   PLT 185 07/06/2023   GLUCOSE 90 07/06/2023   CHOL 102 07/06/2023   TRIG 94 07/06/2023   HDL 37 (L) 07/06/2023   LDLCALC 47 07/06/2023   ALT 23 07/06/2023   AST 23 07/06/2023   NA 145 (H) 07/06/2023   K 4.8 07/06/2023   CL 105 07/06/2023   CREATININE 1.19 07/06/2023   BUN 11 07/06/2023   CO2 22 07/06/2023   TSH 2.720 04/08/2016   PSA 0.7 03/29/2014     Assessment & Plan:  Essential hypertension, benign Assessment & Plan: Stable.  Continue current medications.   Atrial fibrillation, unspecified type [facility]) Assessment & Plan: Stable.  Continue metoprolol .   Gastroesophageal reflux disease, unspecified whether esophagitis present Assessment & Plan: Stable.   Chronic gout without tophus, unspecified cause, unspecified site Assessment & Plan: Doing well.  No recent gout flares.   Pure hypercholesterolemia Assessment & Plan: At goal.  Continue Crestor  and Zetia .     Follow-up:  6 months  Lexus Shampine Bluford DO [facility]

## 2024-01-05 NOTE — Assessment & Plan Note (Signed)
 Doing well.  No recent gout flares.

## 2024-01-13 ENCOUNTER — Ambulatory Visit
Admission: EM | Admit: 2024-01-13 | Discharge: 2024-01-13 | Disposition: A | Discharge status: Home / Self Care | Attending: Family Medicine | Admitting: Family Medicine

## 2024-01-13 DIAGNOSIS — W57XXXA Bitten or stung by nonvenomous insect and other nonvenomous arthropods, initial encounter: Secondary | ICD-10-CM

## 2024-01-13 DIAGNOSIS — L989 Disorder of the skin and subcutaneous tissue, unspecified: Secondary | ICD-10-CM

## 2024-01-13 DIAGNOSIS — S30851A Superficial foreign body of abdominal wall, initial encounter: Secondary | ICD-10-CM | POA: Diagnosis not present

## 2024-01-13 MED ORDER — MUPIROCIN 2 % EX OINT: 1.0000 | TOPICAL_OINTMENT | Freq: Two times a day (BID) | CUTANEOUS | 0 refills | Status: AC

## 2024-01-13 MED ORDER — CHLORHEXIDINE GLUCONATE 4 % EX SOLN: Freq: Every day | CUTANEOUS | 0 refills | Status: AC | PRN

## 2024-01-13 NOTE — ED Triage Notes (Signed)
 Pt reports he has a tick inside of his belly button for about 2 days. States he started itching and wife looked in his belly button and tried to pull it out and it broke.
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
## 2024-01-13 NOTE — Discharge Instructions (Signed)
 We were able to remove the remainder of the tick today.  I have prescribed a wound cleaner for you to clean the area well several times daily with and then apply the mupirocin  ointment.  Follow-up for worsening symptoms at any time.

## 2024-07-05 ENCOUNTER — Ambulatory Visit: Admitting: Family Medicine

## 2024-11-09 ENCOUNTER — Ambulatory Visit
